# Patient Record
Sex: Female | Born: 1955 | Race: Black or African American | Hispanic: No | Smoking: Former smoker
Health system: Southern US, Community
[De-identification: ages and names within clinical notes are randomized; demographics above are authoritative.]

## PROBLEM LIST (undated history)

## (undated) DIAGNOSIS — E119 Type 2 diabetes mellitus without complications: Secondary | ICD-10-CM

## (undated) DIAGNOSIS — E785 Hyperlipidemia, unspecified: Secondary | ICD-10-CM

## (undated) DIAGNOSIS — N200 Calculus of kidney: Secondary | ICD-10-CM

## (undated) DIAGNOSIS — I10 Essential (primary) hypertension: Secondary | ICD-10-CM

## (undated) DIAGNOSIS — D759 Disease of blood and blood-forming organs, unspecified: Secondary | ICD-10-CM

## (undated) DIAGNOSIS — T7840XA Allergy, unspecified, initial encounter: Secondary | ICD-10-CM

## (undated) DIAGNOSIS — D649 Anemia, unspecified: Secondary | ICD-10-CM

## (undated) DIAGNOSIS — L439 Lichen planus, unspecified: Secondary | ICD-10-CM

## (undated) DIAGNOSIS — M1990 Unspecified osteoarthritis, unspecified site: Secondary | ICD-10-CM

## (undated) DIAGNOSIS — K589 Irritable bowel syndrome without diarrhea: Secondary | ICD-10-CM

## (undated) DIAGNOSIS — R011 Cardiac murmur, unspecified: Secondary | ICD-10-CM

## (undated) DIAGNOSIS — F419 Anxiety disorder, unspecified: Secondary | ICD-10-CM

## (undated) DIAGNOSIS — T4145XA Adverse effect of unspecified anesthetic, initial encounter: Secondary | ICD-10-CM

## (undated) DIAGNOSIS — T8859XA Other complications of anesthesia, initial encounter: Secondary | ICD-10-CM

## (undated) DIAGNOSIS — K219 Gastro-esophageal reflux disease without esophagitis: Secondary | ICD-10-CM

## (undated) DIAGNOSIS — D219 Benign neoplasm of connective and other soft tissue, unspecified: Secondary | ICD-10-CM

## (undated) HISTORY — PX: DILATION AND CURETTAGE OF UTERUS: SHX78

## (undated) HISTORY — PX: DIAGNOSTIC LAPAROSCOPY: SUR761

## (undated) HISTORY — PX: COLONOSCOPY: SHX174

## (undated) HISTORY — DX: Type 2 diabetes mellitus without complications: E11.9

## (undated) HISTORY — DX: Irritable bowel syndrome, unspecified: K58.9

## (undated) HISTORY — DX: Allergy, unspecified, initial encounter: T78.40XA

## (undated) HISTORY — DX: Anxiety disorder, unspecified: F41.9

## (undated) HISTORY — DX: Anemia, unspecified: D64.9

## (undated) HISTORY — DX: Lichen planus, unspecified: L43.9

## (undated) HISTORY — DX: Unspecified osteoarthritis, unspecified site: M19.90

## (undated) HISTORY — DX: Hyperlipidemia, unspecified: E78.5

## (undated) HISTORY — DX: Benign neoplasm of connective and other soft tissue, unspecified: D21.9

## (undated) HISTORY — PX: TUBAL LIGATION: SHX77

---

## 1998-09-23 ENCOUNTER — Other Ambulatory Visit: Admission: RE | Admit: 1998-09-23 | Discharge: 1998-09-23 | Payer: Self-pay | Admitting: Obstetrics and Gynecology

## 1999-11-10 ENCOUNTER — Emergency Department (HOSPITAL_COMMUNITY): Admission: EM | Admit: 1999-11-10 | Discharge: 1999-11-10 | Payer: Self-pay | Admitting: Emergency Medicine

## 1999-11-10 ENCOUNTER — Encounter: Payer: Self-pay | Admitting: Emergency Medicine

## 1999-11-25 ENCOUNTER — Other Ambulatory Visit: Admission: RE | Admit: 1999-11-25 | Discharge: 1999-11-25 | Payer: Self-pay | Admitting: Obstetrics and Gynecology

## 2000-02-16 ENCOUNTER — Other Ambulatory Visit: Admission: RE | Admit: 2000-02-16 | Discharge: 2000-02-16 | Payer: Self-pay | Admitting: Gastroenterology

## 2000-03-18 ENCOUNTER — Ambulatory Visit (HOSPITAL_COMMUNITY): Admission: RE | Admit: 2000-03-18 | Discharge: 2000-03-18 | Payer: Self-pay | Admitting: Gastroenterology

## 2000-03-18 ENCOUNTER — Encounter: Payer: Self-pay | Admitting: Gastroenterology

## 2000-04-23 ENCOUNTER — Ambulatory Visit (HOSPITAL_COMMUNITY): Admission: RE | Admit: 2000-04-23 | Discharge: 2000-04-23 | Payer: Self-pay | Admitting: Obstetrics and Gynecology

## 2000-04-23 ENCOUNTER — Encounter (INDEPENDENT_AMBULATORY_CARE_PROVIDER_SITE_OTHER): Payer: Self-pay

## 2001-06-01 ENCOUNTER — Other Ambulatory Visit: Admission: RE | Admit: 2001-06-01 | Discharge: 2001-06-01 | Payer: Self-pay | Admitting: *Deleted

## 2001-07-06 ENCOUNTER — Ambulatory Visit (HOSPITAL_COMMUNITY): Admission: RE | Admit: 2001-07-06 | Discharge: 2001-07-06 | Payer: Self-pay | Admitting: *Deleted

## 2001-07-06 ENCOUNTER — Encounter: Payer: Self-pay | Admitting: *Deleted

## 2001-09-16 ENCOUNTER — Ambulatory Visit (HOSPITAL_BASED_OUTPATIENT_CLINIC_OR_DEPARTMENT_OTHER): Admission: RE | Admit: 2001-09-16 | Discharge: 2001-09-16 | Payer: Self-pay | Admitting: Orthopedic Surgery

## 2001-10-12 HISTORY — PX: CARPAL TUNNEL RELEASE: SHX101

## 2002-02-06 ENCOUNTER — Emergency Department (HOSPITAL_COMMUNITY): Admission: EM | Admit: 2002-02-06 | Discharge: 2002-02-06 | Payer: Self-pay | Admitting: Emergency Medicine

## 2002-11-24 ENCOUNTER — Other Ambulatory Visit: Admission: RE | Admit: 2002-11-24 | Discharge: 2002-11-24 | Payer: Self-pay | Admitting: Obstetrics and Gynecology

## 2003-01-16 ENCOUNTER — Encounter: Payer: Self-pay | Admitting: Family Medicine

## 2003-01-16 ENCOUNTER — Ambulatory Visit (HOSPITAL_COMMUNITY): Admission: RE | Admit: 2003-01-16 | Discharge: 2003-01-16 | Payer: Self-pay | Admitting: Family Medicine

## 2004-06-25 ENCOUNTER — Other Ambulatory Visit: Admission: RE | Admit: 2004-06-25 | Discharge: 2004-06-25 | Payer: Self-pay | Admitting: Family Medicine

## 2004-08-06 ENCOUNTER — Encounter: Payer: Self-pay | Admitting: Gastroenterology

## 2004-08-26 ENCOUNTER — Ambulatory Visit: Payer: Self-pay | Admitting: Gastroenterology

## 2005-08-31 ENCOUNTER — Ambulatory Visit (HOSPITAL_COMMUNITY): Admission: RE | Admit: 2005-08-31 | Discharge: 2005-08-31 | Payer: Self-pay | Admitting: General Surgery

## 2005-12-09 ENCOUNTER — Ambulatory Visit (HOSPITAL_COMMUNITY): Admission: RE | Admit: 2005-12-09 | Discharge: 2005-12-09 | Payer: Self-pay | Admitting: Emergency Medicine

## 2006-06-02 ENCOUNTER — Ambulatory Visit: Payer: Self-pay | Admitting: Family Medicine

## 2007-04-04 DIAGNOSIS — J45909 Unspecified asthma, uncomplicated: Secondary | ICD-10-CM | POA: Insufficient documentation

## 2007-04-04 DIAGNOSIS — J309 Allergic rhinitis, unspecified: Secondary | ICD-10-CM | POA: Insufficient documentation

## 2008-07-26 ENCOUNTER — Ambulatory Visit: Payer: Self-pay | Admitting: Cardiology

## 2008-07-27 ENCOUNTER — Observation Stay (HOSPITAL_COMMUNITY): Admission: EM | Admit: 2008-07-27 | Discharge: 2008-07-28 | Payer: Self-pay | Admitting: Emergency Medicine

## 2008-07-27 ENCOUNTER — Encounter (INDEPENDENT_AMBULATORY_CARE_PROVIDER_SITE_OTHER): Payer: Self-pay | Admitting: Internal Medicine

## 2009-02-05 ENCOUNTER — Encounter: Admission: RE | Admit: 2009-02-05 | Discharge: 2009-02-05 | Payer: Self-pay | Admitting: Family Medicine

## 2009-02-13 ENCOUNTER — Ambulatory Visit (HOSPITAL_COMMUNITY): Admission: RE | Admit: 2009-02-13 | Discharge: 2009-02-13 | Payer: Self-pay | Admitting: Obstetrics and Gynecology

## 2009-10-18 ENCOUNTER — Encounter: Admission: RE | Admit: 2009-10-18 | Discharge: 2009-10-18 | Payer: Self-pay | Admitting: Neurology

## 2009-12-16 ENCOUNTER — Emergency Department (HOSPITAL_COMMUNITY): Admission: EM | Admit: 2009-12-16 | Discharge: 2009-12-16 | Payer: Self-pay | Admitting: Emergency Medicine

## 2010-06-30 ENCOUNTER — Encounter (INDEPENDENT_AMBULATORY_CARE_PROVIDER_SITE_OTHER): Payer: Self-pay | Admitting: *Deleted

## 2010-08-06 ENCOUNTER — Ambulatory Visit (HOSPITAL_COMMUNITY): Admission: RE | Admit: 2010-08-06 | Discharge: 2010-08-06 | Payer: Self-pay | Admitting: Family Medicine

## 2010-08-13 DIAGNOSIS — K219 Gastro-esophageal reflux disease without esophagitis: Secondary | ICD-10-CM | POA: Insufficient documentation

## 2010-08-13 DIAGNOSIS — D573 Sickle-cell trait: Secondary | ICD-10-CM | POA: Insufficient documentation

## 2010-08-13 DIAGNOSIS — E739 Lactose intolerance, unspecified: Secondary | ICD-10-CM | POA: Insufficient documentation

## 2010-08-13 DIAGNOSIS — K589 Irritable bowel syndrome without diarrhea: Secondary | ICD-10-CM

## 2010-08-13 HISTORY — DX: Irritable bowel syndrome, unspecified: K58.9

## 2010-08-14 ENCOUNTER — Ambulatory Visit: Payer: Self-pay | Admitting: Gastroenterology

## 2010-08-14 ENCOUNTER — Ambulatory Visit (HOSPITAL_COMMUNITY): Admission: RE | Admit: 2010-08-14 | Discharge: 2010-08-14 | Payer: Self-pay | Admitting: Gastroenterology

## 2010-08-14 DIAGNOSIS — I10 Essential (primary) hypertension: Secondary | ICD-10-CM | POA: Insufficient documentation

## 2010-08-14 DIAGNOSIS — F411 Generalized anxiety disorder: Secondary | ICD-10-CM | POA: Insufficient documentation

## 2010-08-14 DIAGNOSIS — E785 Hyperlipidemia, unspecified: Secondary | ICD-10-CM | POA: Insufficient documentation

## 2010-08-14 DIAGNOSIS — E782 Mixed hyperlipidemia: Secondary | ICD-10-CM | POA: Insufficient documentation

## 2010-08-14 DIAGNOSIS — R1011 Right upper quadrant pain: Secondary | ICD-10-CM | POA: Insufficient documentation

## 2010-08-14 LAB — CONVERTED CEMR LAB
BUN: 12 mg/dL (ref 6–23)
Basophils Absolute: 0 10*3/uL (ref 0.0–0.1)
Bilirubin, Direct: 0.1 mg/dL (ref 0.0–0.3)
Chloride: 108 meq/L (ref 96–112)
Creatinine, Ser: 0.7 mg/dL (ref 0.4–1.2)
Eosinophils Absolute: 0.2 10*3/uL (ref 0.0–0.7)
Folate: 8.5 ng/mL
GFR calc non Af Amer: 111.66 mL/min (ref >60)
Glucose, Bld: 100 mg/dL — ABNORMAL HIGH (ref 70–99)
HCT: 39.1 % (ref 36.0–46.0)
Hemoglobin: 13.2 g/dL (ref 12.0–15.0)
Iron: 83 ug/dL (ref 42–145)
Lymphocytes Relative: 28.6 % (ref 12.0–46.0)
Lymphs Abs: 2 10*3/uL (ref 0.7–4.0)
MCV: 87.3 fL (ref 78.0–100.0)
Monocytes Relative: 7.4 % (ref 3.0–12.0)
Neutro Abs: 4.3 10*3/uL (ref 1.4–7.7)
Neutrophils Relative %: 61.1 % (ref 43.0–77.0)
RBC: 4.48 M/uL (ref 3.87–5.11)
Saturation Ratios: 23.1 % (ref 20.0–50.0)
TSH: 1.92 microintl units/mL (ref 0.35–5.50)
Transferrin: 256.1 mg/dL (ref 212.0–360.0)
WBC: 7.1 10*3/uL (ref 4.5–10.5)

## 2010-08-27 ENCOUNTER — Telehealth: Payer: Self-pay | Admitting: Gastroenterology

## 2010-10-02 ENCOUNTER — Emergency Department (HOSPITAL_COMMUNITY): Admission: EM | Admit: 2010-10-02 | Payer: Self-pay | Source: Home / Self Care | Admitting: Emergency Medicine

## 2010-11-11 NOTE — Assessment & Plan Note (Signed)
Summary: abd pain--ch.   History of Present Illness Visit Type: Initial Visit Primary GI MD: Sheryn Bison MD FACP FAGA Primary Provider: Elvina Sidle, MD Chief Complaint: RUQ intermittant dull pains that radiated to upper back. Pt states her pain is worse with taking deep breaths and eating too much food at one time. Pt states her pain is much better than when is started a month ago.  History of Present Illness:   55 year old Caucasian female self referred for evaluation of chronic GERD with worsening over the last month she also complains of epigastric discomfort worse with eating with associated nausea. She denies lower gastrointestinal complaints aren't specific hepatobiliary problems. She was in the emergency room in March with a negative lab profile, EKG, chest x-ray.  She had a negative colonoscopy in October 2005 and endoscopy also was unremarkable. I cannot find previous abdominal ultrasound exam. She denies any specific hepatobiliary complaints. Her pain seems to be localized mostly in the epigastric and right upper quadrant area and does radiate into her back. She denies any specific genitourinary complaints. She does not abuse alcohol, cigarettes, or NSAIDs.     GI Review of Systems    Reports abdominal pain, acid reflux, belching, bloating, and  nausea.     Location of  Abdominal pain: RUQ.    Denies chest pain, dysphagia with liquids, dysphagia with solids, heartburn, loss of appetite, vomiting, vomiting blood, weight loss, and  weight gain.      Reports constipation, hemorrhoids, and  rectal pain.     Denies anal fissure, black tarry stools, change in bowel habit, diarrhea, diverticulosis, fecal incontinence, heme positive stool, irritable bowel syndrome, jaundice, light color stool, liver problems, and  rectal bleeding. Preventive Screening-Counseling & Management  Alcohol-Tobacco     Smoking Status: quit    Current Medications (verified): 1)  Amlodipine Besylate 10  Mg Tabs (Amlodipine Besylate) .... One Tablet By Mouth Once Daily 2)  Multivitamins   Tabs (Multiple Vitamin) .... One Tablet By Mouth Once Daily 3)  Vitamin D (Ergocalciferol) 50000 Unit Caps (Ergocalciferol) .... One Tablet By Mouth Once A Week  Allergies (verified): 1)  ! Pcn 2)  ! Sulfa 3)  ! Codeine 4)  ! Erythromycin  Past History:  Past medical, surgical, family and social histories (including risk factors) reviewed for relevance to current acute and chronic problems.  Past Medical History: Current Problems:  HYPERLIPIDEMIA (ICD-272.4) HYPERTENSION (ICD-401.9) ANXIETY (ICD-300.00) GERD (ICD-530.81) LACTOSE INTOLERANCE (ICD-271.3) IRRITABLE BOWEL SYNDROME (ICD-564.1) SICKLE CELL TRAIT (ICD-282.5) FAMILY HISTORY DIABETES 1ST DEGREE RELATIVE (ICD-V18.0) ASTHMA (ICD-493.90) ALLERGIC RHINITIS (ICD-477.9)    Past Surgical History: c-section Tubal Ligation Laparoscopy Carpal Tunnel release  Family History: Reviewed history from 04/04/2007 and no changes required. Family History Diabetes 1st degree relative Fam hx Sickle cell trait Family History of Pancreatic Cancer:Grandfather Family History of Stomach Cancer:Uncle Family History of Diabetes: Father, Brother, Sister Family History of Heart Disease: Father, Uncle  Social History: Reviewed history from 04/04/2007 and no changes required. Married [employer] Alcohol use-yes Drug use-no Patient is a former smoker.  Daily Caffeine Use Smoking Status:  quit  Review of Systems       The patient complains of anxiety-new, arthritis/joint pain, back pain, fatigue, hearing problems, heart murmur, night sweats, sleeping problems, swollen lymph glands, and urine leakage.  The patient denies allergy/sinus, anemia, blood in urine, breast changes/lumps, change in vision, confusion, cough, coughing up blood, depression-new, fainting, fever, headaches-new, heart rhythm changes, itching, menstrual pain, muscle pains/cramps,  nosebleeds, pregnancy symptoms,  shortness of breath, skin rash, sore throat, swelling of feet/legs, thirst - excessive , urination - excessive , urination changes/pain, vision changes, and voice change.    Vital Signs:  Patient profile:   55 year old female Height:      66 inches Weight:      192 pounds BMI:     31.10 Pulse rate:   80 / minute Pulse rhythm:   regular BP sitting:   124 / 82  (right arm) Cuff size:   regular  Vitals Entered By: Christie Nottingham CMA Duncan Dull) (August 14, 2010 8:46 AM)  Physical Exam  General:  Well developed, well nourished, no acute distress.healthy appearing.   Head:  Normocephalic and atraumatic. Eyes:  PERRLA, no icterus.exam deferred to patient's ophthalmologist.   Neck:  Supple; no masses or thyromegaly. Lungs:  Clear throughout to auscultation. Heart:  Regular rate and rhythm; no murmurs, rubs,  or bruits. Abdomen:  Soft, nontender and nondistended. No masses, hepatosplenomegaly or hernias noted. Normal bowel sounds. Extremities:  No clubbing, cyanosis, edema or deformities noted. Neurologic:  Alert and  oriented x4;  grossly normal neurologically. Cervical Nodes:  No significant cervical adenopathy. Psych:  Alert and cooperative. Normal mood and affect.   Impression & Recommendations:  Problem # 1:  RUQ PAIN (ICD-789.01) Assessment Unchanged rule out cholelithiasis versus worsening acid reflux with secondary esophageal spasm. We have scheduled labs and upper abdominal ultrasound exam, I will do endoscopy if ultrasound is negative. She had been placed on PPI therapy and at the spasmodic Cc p.r.n. along with p.r.n. analgesia. Standard antireflux maneuvers have been reviewed. Orders: TLB-CBC Platelet - w/Differential (85025-CBCD) TLB-BMP (Basic Metabolic Panel-BMET) (80048-METABOL) TLB-Hepatic/Liver Function Pnl (80076-HEPATIC) TLB-TSH (Thyroid Stimulating Hormone) (84443-TSH) TLB-B12, Serum-Total ONLY (09323-F57) TLB-Ferritin  (82728-FER) TLB-Folic Acid (Folate) (82746-FOL) TLB-IBC Pnl (Iron/FE;Transferrin) (83550-IBC) Ultrasound Abdomen (UAS)  Problem # 2:  ANXIETY (ICD-300.00) Assessment: Unchanged Librax t.i.d. pending workup.  Problem # 3:  LACTOSE INTOLERANCE (ICD-271.3) Assessment: Unchanged  Problem # 4:  IRRITABLE BOWEL SYNDROME (ICD-564.1) Assessment: Unchanged  Patient Instructions: 1)  Copy sent to : Elvina Sidle, MD 2)  Please go to the basement today for your labs.  3)  Your prescription(s) have been sent to you pharmacy.  4)  Your ultrasound is scheduled for today please go there after your lab. If your ultrasound is normal we will call you to schedule an upper Endoscopy 5)  Take the Nexium samples once a day, samples provided. 6)  The medication list was reviewed and reconciled.  All changed / newly prescribed medications were explained.  A complete medication list was provided to the patient / caregiver. Prescriptions: LEVSIN 0.125 MG TABS (HYOSCYAMINE SULFATE) take one by mouth as needed  #60 x 3   Entered by:   Harlow Mares CMA (AAMA)   Authorized by:   Mardella Layman MD [facility]   Signed by:   Harlow Mares CMA (AAMA) on 08/14/2010   Method used:   Electronically to        [facility]  [address] [id number] ([facility])       [address]       [place], [place]  [address]       Ph: [phone] or [phone]       Fax: [phone]   RxID:   [id number]

## 2010-11-11 NOTE — Progress Notes (Signed)
Summary: [place] Results  Phone Note Call from Patient Call back at Home Phone [phone]   Caller: Patient Call For: Dr. Jarold Motto Reason for Call: Talk to Nurse Details for Reason: Ultrasound Results Summary of Call: Pt. requesting a call regarding ultrasound results. OK to leave a message. Initial call taken by: Schuyler Amor,  August 27, 2010 2:30 PM  Follow-up for Phone Call        ultrasound normal, pt advised. i have forwarded everything to her Primary care MD Follow-up by: Harlow Mares CMA Duncan Dull),  August 27, 2010 3:37 PM

## 2010-11-11 NOTE — Procedures (Signed)
Summary: Colonoscopy   Colonoscopy  Procedure date:  08/06/2004  Findings:      Location:  [facility].   Patient Name: Rachael Garcia, Rachael Garcia MRN:  Procedure Procedures: Colonoscopy CPT: [address].  Personnel: Endoscopist: Vania Rea. Jarold Motto, MD.  Exam Location: Exam performed in Outpatient Clinic. Outpatient  Patient Consent: Procedure, Alternatives, Risks and Benefits discussed, consent obtained, from patient. Consent was obtained by the RN.  Indications  Evaluation of: Positive fecal occult blood test per home screening.  Symptoms: Constipation Patient's stools are infrequent. Patient has difficulty evacuating, strains with stool passage.  History  Current Medications: Patient is not currently taking Coumadin.  Pre-Exam Physical: Performed Aug 06, 2004. Cardio-pulmonary exam, Rectal exam, Abdominal exam, Extremity exam, Mental status exam WNL.  Exam Exam: Extent of exam reached: Cecum, extent intended: Cecum.  The cecum was identified by appendiceal orifice and IC valve. Patient position: on left side. Duration of exam: 20 minutes. Colon retroflexion performed. Images taken. ASA Classification: I. Tolerance: excellent.  Monitoring: Pulse and BP monitoring, Oximetry used. Supplemental O2 given. at 2 Liters.  Colon Prep Used Golytely for colon prep. Prep results: excellent.  Sedation Meds: Patient assessed and found to be appropriate for moderate (conscious) sedation. Fentanyl 75 mcg. given IV. Versed 7 mg. given IV.  Instrument(s): CF 140L. Serial [id number].  Findings - NORMAL EXAM: Cecum to Rectum. Not Seen: Polyps. AVM's. Colitis. Tumors. Melanosis. Crohn's. Diverticulosis. Hemorrhoids.   Assessment Normal examination.  Events  Unplanned Interventions: No intervention was required.  Plans Medication Plan: Continue current medications.  Patient Education: Patient given standard instructions for: Constipation. Disposition: After procedure  patient sent to recovery. After recovery patient sent home.  Scheduling/Referral: EGD, to [facility]. Jarold Motto, MD, on Aug 06, 2004.    CC: Angelena Sole, MD  This report was created from the original endoscopy report, which was reviewed and signed by the above listed endoscopist.

## 2010-11-11 NOTE — Letter (Signed)
Summary: New Patient letter  [facility]  [address] [place], [place] [address]   Phone: [phone]  Fax: [phone]       06/30/2010 MRN: [id number]  [facility] [address] [place], [place]  [address]  Dear [name],  Welcome to the Gastroenterology Division at [facility].    You are scheduled to see Dr.  Jarold Motto on 08-14-10 at 8:30a.m. on the 3rd floor at [facility], [address]  We ask that you try to arrive at our office 15 minutes prior to your appointment time to allow for check-in.  We would like you to complete the enclosed self-administered evaluation form prior to your visit and bring it with you on the day of your appointment.  We will review it with you.  Also, please bring a complete list of all your medications or, if you prefer, bring the medication bottles and we will list them.  Please bring your insurance card so that we may make a copy of it.  If your insurance requires a referral to see a specialist, please bring your referral form from your primary care physician.  Co-payments are due at the time of your visit and may be paid by cash, check or credit card.     Your office visit will consist of a consult with your physician (includes a physical exam), any laboratory testing he/she may order, scheduling of any necessary diagnostic testing (e.g. x-ray, ultrasound, CT-scan), and scheduling of a procedure (e.g. Endoscopy, Colonoscopy) if required.  Please allow enough time on your schedule to allow for any/all of these possibilities.    If you cannot keep your appointment, please call [phone] to cancel or reschedule prior to your appointment date.  This allows [place] the opportunity to schedule an appointment for another patient in need of care.  If you do not cancel or reschedule by 5 p.m. the business day prior to your appointment date, you will be charged a $50.00 late cancellation/no-show fee.    Thank you for choosing  [facility] for your medical needs.  We appreciate the opportunity to care for you.  Please visit [place] at our website  to learn more about our practice.                     Sincerely,                                                             The Gastroenterology Division

## 2010-11-11 NOTE — Procedures (Signed)
Summary: Endoscopy   EGD  Procedure date:  08/06/2004  Findings:      Location: [facility]   Patient Name: Rachael Garcia, Rachael Garcia MRN:  Procedure Procedures: Panendoscopy (EGD) CPT: 43235.    with esophageal dilation. CPT: G9296129.  Personnel: Endoscopist: Vania Rea. Jarold Motto, MD.  Exam Location: Exam performed in Outpatient Clinic. Outpatient  Patient Consent: Procedure, Alternatives, Risks and Benefits discussed, consent obtained, from patient. Consent was obtained by the RN.  Indications  Evaluation of: Positive fecal occult blood test  Symptoms: Dysphagia. Reflux symptoms  History  Current Medications: Patient is not currently taking Coumadin.  Pre-Exam Physical: Performed Aug 06, 2004  Cardio-pulmonary exam, Abdominal exam, Extremity exam, Mental status exam WNL.  Exam Exam Info: Maximum depth of insertion Duodenum, intended Duodenum. Patient position: on left side. Duration of exam: 15 minutes. Vocal cords visualized. Gastric retroflexion performed. Images taken. ASA Classification: I. Tolerance: excellent.  Sedation Meds: Patient assessed and found to be appropriate for moderate (conscious) sedation. Cetacaine Spray 2 sprays given aerosolized.  Monitoring: BP and pulse monitoring done. Oximetry used. Supplemental O2 given at 2 Liters.  Instrument(s): GIF 160. Serial [id number].   Findings - Normal: Fundus to Duodenal 2nd Portion. Not Seen: Ulcer. Mucosal abnormality. Foreign body.  - Normal: Proximal Esophagus to Distal Esophagus. Tumor. Barrett's esophagus. Esophageal inflammation. Stricture. Varices.  - Dilation: Proximal Esophagus. Maloney dilator used, Diameter: 56 F, No Resistance, No Heme present on extraction. 1  total dilators used. Patient tolerance excellent. Outcome: successful.   Assessment Normal examination.  Events  Unplanned Intervention: No unplanned interventions were required.  Plans Instructions: Home hemoccult tests to  be obtained, CPT: Hm. Hemoccult. 3 cards dispensed.  Medication(s): Continue current medications.  Patient Education: Patient given standard instructions for: Reflux.  Disposition: After procedure patient sent to recovery. After recovery patient sent home.  Scheduling: Follow-up prn.    CC: Angelena Sole, MD   This report was created from the original endoscopy report, which was reviewed and signed by the above listed endoscopist.

## 2011-01-05 LAB — DIFFERENTIAL
Basophils Absolute: 0 10*3/uL (ref 0.0–0.1)
Basophils Relative: 1 % (ref 0–1)
Eosinophils Relative: 1 % (ref 0–5)
Lymphocytes Relative: 29 % (ref 12–46)
Lymphs Abs: 2.5 10*3/uL (ref 0.7–4.0)
Monocytes Relative: 6 % (ref 3–12)
Neutro Abs: 5.4 10*3/uL (ref 1.7–7.7)
Neutrophils Relative %: 64 % (ref 43–77)

## 2011-01-05 LAB — POCT CARDIAC MARKERS: CKMB, poc: <1 ng/mL — ABNORMAL LOW (ref 1.0–8.0)

## 2011-01-05 LAB — BASIC METABOLIC PANEL
BUN: 9 mg/dL (ref 6–23)
CO2: 30 mEq/L (ref 19–32)
GFR calc non Af Amer: >60 mL/min (ref >60)
Glucose, Bld: 129 mg/dL — ABNORMAL HIGH (ref 70–99)
Sodium: 139 mEq/L (ref 135–145)

## 2011-01-05 LAB — CBC: Platelets: 231 10*3/uL (ref 150–400)

## 2011-02-24 NOTE — H&P (Signed)
Rachael Garcia, Rachael Garcia               ACCOUNT NO.:  [id number]   MEDICAL RECORD NO.:  [id number]          PATIENT TYPE:  OBV   LOCATION:  4732                         FACILITY:  [facility]   PHYSICIAN:  Lonia Blood, M.D.      DATE OF BIRTH:  October 04, 1956   DATE OF ADMISSION:  07/26/2008  DATE OF DISCHARGE:                              HISTORY & PHYSICAL   PRIMARY CARE PHYSICIAN:  The patient is unassigned to [place].  She goes to  [facility].   PRESENTING COMPLAINT:  Chest pain and headache.   HISTORY OF PRESENT ILLNESS:  The patient is a 55 year old female with  history of hypertension who apparently has been having some left-sided  chest pain.  She goes to [facility] for her regular care.  This  has been going on for least 2 weeks on and off, but got worse today.  She also has some left-sided headache with some numbness on the left  upper arm which has been going on for almost a month.  Symptoms seem to  have gotten worse, so she decided to come to the emergency room.  Denied  any diaphoresis.  Denied any nausea or vomiting.  Her chest pain is once  central.  There is no radiation.  She described it as 6-7/10.  No fever.  No nausea, vomiting, or diarrhea.   PAST MEDICAL HISTORY:  Significant for mainly hypertension.   MEDICATIONS:  Norvasc 5 mg daily.   ALLERGIES:  CODEINE, ERYTHROMYCIN, and SULFA.   SOCIAL HISTORY:  The patient lives in [place].  She is single.  No  heavy alcohol or IV drug use.  She smokes 2-3 cigarettes a day.   FAMILY HISTORY:  Her father died at the age of 75.  He had early  coronary artery disease, hypertension, and had defibrillator prior to  his death.   REVIEW OF SYSTEMS:  An 12-point review of systems is negative except per  HPI.   PHYSICAL EXAMINATION:  VITAL SIGNS:  On exam temperature is 97.1, blood  pressure is 158/91, pulse 68, respiratory 20, and sats 98% on room air.  GENERAL:  She is awake, alert, oriented peasant in no acute  distress.  HEENT:  PERRL.  EOMI.  NECK:  Supple.  No JVD.  No lymphadenopathy.  RESPIRATORY:  Good air entry bilaterally.  No wheezes or rales.  CARDIOVASCULAR:  The patient has S1 and S2.  No murmur.  ABDOMEN:  Soft, nontender with positive bowel sounds.  EXTREMITIES:  No edema, cyanosis or clubbing.   LABORATORY DATA:  Sodium is 143, potassium 3.6, chloride 110, BUN 11,  creatinine 1.0, glucose 100, and calcium 1.07.  White count is 7.2,  hemoglobin 13.5, and platelet count 198.  PT 13.5, INR 1.0.  Initial  cardiac enzymes are negative.  Chest x-ray showed mild cardiomegaly  otherwise unremarkable.  Head CT without contrast is currently pending.   ASSESSMENT:  A 55 year old female presenting with chest pain and risk  factors for coronary artery disease including hypertension, mild  dyslipidemia, palpitation, and positive family history.  At this point,  the history suggests chronic and not an acute chest pain.  EKG showed no  immediate change and no initial cardiac enzymes bump, chances are this  may not be cardiac.  However, due to high risk factors will admit her  for rule out MI.  We will check serial cardiac enzymes.  If it is  negative, the patient we will set up for outpatient stress testing.  The  patient also have left-sided headache as indicated and left upper  extremity weakness and numbness.  I doubt with significant at this point  however, we will follow on head CT and if needed will be do a TIA  workup.  Tobacco abuse.  The patient says she is takes tobacco on and  off it is not constant.  She does not desire any nicotine patch at this  point.      Lonia Blood, M.D.  Electronically Signed     LG/MEDQ  D:  07/27/2008  T:  07/27/2008  Job:  [id number]

## 2011-02-27 NOTE — H&P (Signed)
[facility]  Patient:    Rachael Garcia, Rachael Garcia                        MRN: [id number] Adm. Date:  04/23/00 Attending:  Janine Limbo, M.D. CC:         Delorse Lek, M.D.             Vania Rea. Jarold Motto, M.D. [facility]                         History and Physical  HISTORY OF PRESENT ILLNESS:   Rachael Garcia is a 55 year old female, para 2-0-2-2, who presents for a diagnostic laparoscopy because of severe and recurrent left lower quadrant pain.  The patient reports that her discomfort has gotten progressively worse over the past three to four months.  It has progressed to the point that she is having difficulty working when she has this pain approximately once each month.  The patient has had two cesarean sections in the past and she has also had a tubal ligation.  She had a D&C associated with a miscarriage both in 37 and 1991.  The patient is otherwise in good gynecologic health.  She denies a history of sexually transmitted infections.  She does have a known fibroid uterus.  OBSTETRICAL HISTORY:          The patient had a cesarean section in 1983 and then again in 1992.  She had a tubal ligation in 1992.  She had miscarriages in 1989 and 1991.  DRUG ALLERGIES:               PENICILLIN and SULFA medications.  PAST MEDICAL HISTORY:         The patient has a history of irritable bowel syndrome.  She also has a history of sickle cell trait.  MEDICATIONS:                  She is currently not taking any medications.  SOCIAL HISTORY:               The patient is married.  She is a cigarette smoker.  REVIEW OF SYSTEMS:            The patient complains of pelvic pressure.  FAMILY HISTORY:               The patients father has diet controlled diabetes.  Her father also had hypertension and heart disease.  Her mother is anemic.  PHYSICAL EXAMINATION  GENERAL:                      Weight is 178 pounds.  HEENT:                        Within normal  limits.  CHEST:                        Clear.  HEART:                        Regular rate and rhythm.  BREASTS:                      Without masses.  ABDOMEN:                      Nontender.  EXTREMITIES:                  Within normal limits.  NEUROLOGICAL EXAM:            Normal.  PELVIC EXAM:                  External genitalia is normal.  The vagina is normal.  Cervix is nontender.  The uterus is 8 to 10-week size and irregular. There is a 2 cm fibroid present on the right lower uterine segment.  Adnexa no masses.  Rectovaginal exam confirms.  LABORATORY VALUES:            The patient had a normal colonoscopy in May 2001.  Her other laboratory evaluations have been normal.  ASSESSMENT:                   1. Left lower quadrant pain of increasing                                  severity.                               2. History of a fibroid uterus.  PLAN:                         A long discussion was held with the patient about our options for care.  She has elected to proceed with diagnostic laparoscopy.  If there is any pathology associated with the left ovary, then she wants to proceed with laparoscopic left oophorectomy.  She wants to maintain the integrity of her right ovary if at all possible.  She understands the indications for her procedure and she accepts the risks of, but not limited to, anesthetic complications, bleeding, infections, and possible damage to the surrounding organs.  She understands that no guarantees can be given concerning the total relief of her discomfort. DD:  04/22/00 TD:  04/22/00 Job: [id number]

## 2011-02-27 NOTE — Op Note (Signed)
[facility]  Patient:    Rachael Garcia, Rachael Garcia                      MRN: [id number] Proc. Date: 04/23/00 Adm. Date:  [id number] Attending:  Leonard Schwartz                           Operative Report  PREOPERATIVE DIAGNOSIS:       Left lower quadrant pain.  POSTOPERATIVE DIAGNOSIS:      Left lower quadrant pain.  Fibroid uterus.  Left ovarian cyst.  OPERATION:                    Diagnostic laparoscopy.  Laparoscopic left ovarian cystectomy.  Pelvic washings.  SURGEON:                      Janine Limbo, M.D.  ASSISTANT:  ANESTHESIA:                   General anesthesia.  ESTIMATED BLOOD LOSS:  INDICATIONS:                  Rachael Garcia is a 55 year old female who is status ost tubal ligation who presents with left lower quadrant pain that has occurred for the past three to four months.  Her pain is severe enough that she has had difficulty working and she has been to the emergency room on several occasions.  The patient understands the indications for her procedure and she accepts the risks of, but not limited to, anesthetic complications, bleeding, infections, and possible damage to the surrounding organs.  The patient wants to keep her uterus and she also wants to keep her right ovary unless a malignancy is present.  She understands that no guarantees can be given concerning the total relief of her pelvic discomfort.  FINDINGS:                     The uterus was approximately 12 weeks size and there was a 5 cm fibroid present at the right fundus and a 4 cm fibroid present at the posterior left lower surface.  There was an 8 cm clear cyst on the left ovary. The left ovary otherwise appeared normal.  The right ovary appeared normal.  Both fallopian tubes appeared normal except for defects from the prior tubal ligation. The appendix appeared normal.  The bowel was normal.  The liver appeared normal. There was no evidence of  malignancy present in the pelvis or in the abdomen.  DESCRIPTION OF PROCEDURE:     The patient was taken to the operating room where a general anesthesia was given.  The patients abdomen, perineum, and vagina were prepped with multiple layers of Betadine.  A Foley catheter was placed in the bladder.  Examination under anesthesia was performed.  The patient was sterilely draped.  The subumbilical area was injected with 0.5% Marcaine.  A subumbilical  incision was made and the Veress needle was inserted into the abdominal cavity without difficulty.  Proper placement was confirmed using the saline drop test. A pneumoperitoneum was then obtained.  The laparoscopic trocar and then the laparoscope were substituted for the Veress needle.  The pelvic organs were visualized with findings as mentioned above.  Two suprapubic areas were injected with 0.5% marcaine.  Two suprapubic incisions were made and 5 mm trocars were placed in  the lower abdomen.  Pictures were taken of the patients pelvic and abdominal structures.  Washings were obtained from the pelvis.  The left ovary as held and an incision was made in the capsule.  The laparoscopic scissors were used to incise the ovarian tissue overlying the cyst.  A combination of sharp and blunt dissection were used to separate the cyst wall from the left ovary.  The cyst was ruptured as we tried to dissect it from the ovary.  Clear fluid was noted to be  within the cyst.  We were able to completely remove the cyst.  Hemostasis was adequate.  The pelvis was then irrigated.  Again, hemostasis was noted to be adequate.  The bowel was inspected and there was no evidence of damage to the bowel.  The pneumoperitoneum was allowed to escape.  All instruments were removed. The incisions were closed using deep and superficial sutures of 4-0 Vicryl. Sponge, needle, and instrument counts were correct x 2 occasions.  The estimated blood loss was 10 cc.  The  patient tolerated her procedure well.  She was noted to drain clear yellow urine.  The patient was awakened from her anesthetic and taken to the recovery room in stable condition.  FOLLOW-UP:                    The patient was given Darvocet-N 100 one tablet every four to six hours as needed for pain.  She will return to see Janine Limbo, M.D. in two to three weeks for follow-up instructions.  She was given a copy of  the patients postoperative instruction sheet as prepared by [facility] for patients who have undergone a diagnostic laparoscopy. DD:  04/23/00 TD:  04/24/00 Job: [id number]

## 2011-02-27 NOTE — Op Note (Signed)
[place]  Patient:    Rachael Garcia, Rachael Garcia Visit Number: [id number] MRN: [id number]          Service Type: DSU Location: [facility] Attending Physician:  Ronne Binning Dictated by:   Nicki Reaper, M.D. Proc. Date: 09/16/01 Admit Date:  09/16/2001   CC:         Nicki Reaper, M.D. x 2   Operative Report  PREOPERATIVE DIAGNOSIS:  Carpal tunnel syndrome right hand.  POSTOPERATIVE DIAGNOSIS:  Carpal tunnel syndrome right hand.  OPERATION:  Decompression right median nerve.  SURGEON:  Nicki Reaper, M.D.  ASSISTANT:  R.N.  ANESTHESIA:  Forearm-based IV regional.  ANESTHESIOLOGIST:  Janetta Hora. Gelene Mink, M.D.  INDICATION:  The patient is a 55 year old female with a history of carpal tunnel syndrome.  EMG nerve conduction is positive which has not responded to conservative treatment.  DESCRIPTION OF PROCEDURE:  The patient was brought to the operating room where a forearm-based IV regional anesthetic was carried out without difficulty. She was prepped and draped using Betadine scrub and solution with the right arm free.  A longitudinal incision was made in the palm and carried down through the subcutaneous tissue.  Bleeders were electrocauterized.  Palmar fascia was split.  Superficial palmar arch identified.  The flexor tendon to the ring and little finger identified to the ulnar side of the median nerve.  The carpal retinaculum was incised with sharp dissection.  A right angle and sural retractor were placed between the skin and forearm fascia.  The fascia was released for approximately 3 cm proximal to the wrist crease under direct vision.  Canal was explored.  Tenosynovial tissue was thickened.  No further lesions were identified.  The wound was irrigated.  The skin was closed with interrupted 5-0 sutures.  A sterile dressing and splint was applied.  The patient tolerated the procedure well and was taken to the recovery room  for observation in satisfactory condition.  She is discharged home to return to the [facility] [place] in one week on Vicodin and Septra DS. Dictated by:   Nicki Reaper, M.D. Attending Physician:  Ronne Binning DD:  09/16/01 TD:  09/16/01 Job: [id number]

## 2011-07-14 LAB — POCT CARDIAC MARKERS
CKMB, poc: <1 — ABNORMAL LOW
Myoglobin, poc: 45.1
Troponin i, poc: <0.05

## 2011-07-14 LAB — URINALYSIS, ROUTINE W REFLEX MICROSCOPIC
Bilirubin Urine: NEGATIVE
Hgb urine dipstick: NEGATIVE
Ketones, ur: NEGATIVE
Nitrite: NEGATIVE
Specific Gravity, Urine: 1.014
Urobilinogen, UA: 1

## 2011-07-14 LAB — POCT I-STAT, CHEM 8
BUN: 11
Calcium, Ion: 1.07 — ABNORMAL LOW
Chloride: 110
Creatinine, Ser: 1
Glucose, Bld: 100 — ABNORMAL HIGH
HCT: 41
Hemoglobin: 13.9
Potassium: 3.6
Sodium: 143
TCO2: 25

## 2011-07-14 LAB — BASIC METABOLIC PANEL
BUN: 10
Creatinine, Ser: 0.67
Potassium: 3.5

## 2011-07-14 LAB — CARDIAC PANEL(CRET KIN+CKTOT+MB+TROPI)
Relative Index: 1.2
Relative Index: INVALID
Troponin I: 0.01
Troponin I: <0.01

## 2011-07-14 LAB — URINE DRUGS OF ABUSE SCREEN W ALC, ROUTINE (REF LAB)
Barbiturate Quant, Ur: NEGATIVE
Creatinine,U: 77.3
Ethyl Alcohol: <10
Opiate Screen, Urine: NEGATIVE
Phencyclidine (PCP): NEGATIVE

## 2011-07-14 LAB — CBC
Hemoglobin: 13.5
MCHC: 33.2
Platelets: 198
RBC: 4.51
WBC: 7.2

## 2011-07-14 LAB — CK TOTAL AND CKMB (NOT AT ARMC)
CK, MB: 1.3
Relative Index: 1.2
Total CK: 108

## 2011-07-14 LAB — PROTIME-INR: INR: 1

## 2011-07-14 LAB — LIPID PANEL
Cholesterol: 227 — ABNORMAL HIGH
LDL Cholesterol: 154 — ABNORMAL HIGH
Total CHOL/HDL Ratio: 3.9
Triglycerides: 76
VLDL: 15

## 2011-07-14 LAB — PHOSPHORUS: Phosphorus: 3.8

## 2011-07-14 LAB — CALCIUM: Calcium: 8.8

## 2011-08-18 ENCOUNTER — Encounter (HOSPITAL_BASED_OUTPATIENT_CLINIC_OR_DEPARTMENT_OTHER): Payer: Self-pay | Admitting: *Deleted

## 2011-08-19 ENCOUNTER — Other Ambulatory Visit: Payer: Self-pay | Admitting: Orthopedic Surgery

## 2011-08-19 ENCOUNTER — Encounter (HOSPITAL_BASED_OUTPATIENT_CLINIC_OR_DEPARTMENT_OTHER)
Admission: RE | Admit: 2011-08-19 | Discharge: 2011-08-19 | Disposition: A | Discharge status: Home / Self Care | Payer: Worker's Compensation | Source: Ambulatory Visit | Attending: Orthopedic Surgery | Admitting: Orthopedic Surgery

## 2011-08-19 LAB — BASIC METABOLIC PANEL
BUN: 10 mg/dL (ref 6–23)
CO2: 29 mEq/L (ref 19–32)
Calcium: 9.6 mg/dL (ref 8.4–10.5)
Creatinine, Ser: 0.75 mg/dL (ref 0.50–1.10)
Glucose, Bld: 134 mg/dL — ABNORMAL HIGH (ref 70–99)

## 2011-08-20 ENCOUNTER — Encounter (HOSPITAL_BASED_OUTPATIENT_CLINIC_OR_DEPARTMENT_OTHER): Payer: Self-pay | Admitting: Anesthesiology

## 2011-08-20 ENCOUNTER — Ambulatory Visit (HOSPITAL_BASED_OUTPATIENT_CLINIC_OR_DEPARTMENT_OTHER)
Admission: RE | Admit: 2011-08-20 | Discharge: 2011-08-20 | Disposition: A | Discharge status: Home / Self Care | Payer: Worker's Compensation | Source: Ambulatory Visit | Attending: Orthopedic Surgery | Admitting: Orthopedic Surgery

## 2011-08-20 ENCOUNTER — Encounter (HOSPITAL_BASED_OUTPATIENT_CLINIC_OR_DEPARTMENT_OTHER)
Admission: RE | Disposition: A | Discharge status: Home / Self Care | Payer: Self-pay | Source: Ambulatory Visit | Attending: Orthopedic Surgery

## 2011-08-20 ENCOUNTER — Ambulatory Visit (HOSPITAL_BASED_OUTPATIENT_CLINIC_OR_DEPARTMENT_OTHER): Payer: Worker's Compensation | Admitting: Anesthesiology

## 2011-08-20 DIAGNOSIS — J45909 Unspecified asthma, uncomplicated: Secondary | ICD-10-CM | POA: Insufficient documentation

## 2011-08-20 DIAGNOSIS — K219 Gastro-esophageal reflux disease without esophagitis: Secondary | ICD-10-CM | POA: Insufficient documentation

## 2011-08-20 DIAGNOSIS — Z01812 Encounter for preprocedural laboratory examination: Secondary | ICD-10-CM | POA: Insufficient documentation

## 2011-08-20 DIAGNOSIS — D573 Sickle-cell trait: Secondary | ICD-10-CM | POA: Insufficient documentation

## 2011-08-20 DIAGNOSIS — G5600 Carpal tunnel syndrome, unspecified upper limb: Secondary | ICD-10-CM | POA: Insufficient documentation

## 2011-08-20 DIAGNOSIS — I10 Essential (primary) hypertension: Secondary | ICD-10-CM | POA: Insufficient documentation

## 2011-08-20 HISTORY — DX: Cardiac murmur, unspecified: R01.1

## 2011-08-20 HISTORY — DX: Essential (primary) hypertension: I10

## 2011-08-20 HISTORY — DX: Other complications of anesthesia, initial encounter: T88.59XA

## 2011-08-20 HISTORY — DX: Adverse effect of unspecified anesthetic, initial encounter: T41.45XA

## 2011-08-20 HISTORY — DX: Disease of blood and blood-forming organs, unspecified: D75.9

## 2011-08-20 HISTORY — DX: Gastro-esophageal reflux disease without esophagitis: K21.9

## 2011-08-20 HISTORY — PX: CARPAL TUNNEL RELEASE: SHX101

## 2011-08-20 LAB — POCT HEMOGLOBIN-HEMACUE: Hemoglobin: 11.1 g/dL — ABNORMAL LOW (ref 12.0–15.0)

## 2011-08-20 SURGERY — CARPAL TUNNEL RELEASE
Anesthesia: General | Site: Wrist | Laterality: Left | Wound class: Clean

## 2011-08-20 MED ORDER — HYDROCODONE-ACETAMINOPHEN 5-325 MG PO TABS
1.0000 | ORAL_TABLET | Freq: Four times a day (QID) | ORAL | Status: DC | PRN
  Administered 2011-08-20: 1 via ORAL

## 2011-08-20 MED ORDER — LIDOCAINE-PRILOCAINE 2.5-2.5 % EX CREA: 1.0000 "application " | TOPICAL_CREAM | Freq: Once | CUTANEOUS | Status: DC

## 2011-08-20 MED ORDER — FENTANYL CITRATE 0.05 MG/ML IJ SOLN
INTRAMUSCULAR | Status: DC | PRN
  Administered 2011-08-20: 100 ug via INTRAVENOUS

## 2011-08-20 MED ORDER — ATROPINE SULFATE 0.4 MG/ML IJ SOLN: 0.4000 mg | Freq: Once | INTRAMUSCULAR | Status: DC | PRN

## 2011-08-20 MED ORDER — KETOROLAC TROMETHAMINE 30 MG/ML IJ SOLN: 15.0000 mg | Freq: Once | INTRAMUSCULAR | Status: DC | PRN

## 2011-08-20 MED ORDER — MIDAZOLAM HCL 5 MG/5ML IJ SOLN
INTRAMUSCULAR | Status: DC | PRN
  Administered 2011-08-20: 2 mg via INTRAVENOUS

## 2011-08-20 MED ORDER — IBUPROFEN 200 MG PO TABS: 200.0000 mg | ORAL_TABLET | Freq: Four times a day (QID) | ORAL | Status: DC | PRN

## 2011-08-20 MED ORDER — MIDAZOLAM HCL 2 MG/2ML IJ SOLN
0.5000 mg | INTRAMUSCULAR | Status: DC | PRN
Start: 2011-08-20 — End: 2011-08-20

## 2011-08-20 MED ORDER — LIDOCAINE HCL (CARDIAC) 20 MG/ML IV SOLN
INTRAVENOUS | Status: DC | PRN
  Administered 2011-08-20: 60 mg via INTRAVENOUS

## 2011-08-20 MED ORDER — GLYCOPYRROLATE 0.2 MG/ML IJ SOLN: 0.2000 mg | Freq: Once | INTRAMUSCULAR | Status: DC | PRN

## 2011-08-20 MED ORDER — EPHEDRINE SULFATE 50 MG/ML IJ SOLN
INTRAMUSCULAR | Status: DC | PRN
  Administered 2011-08-20 (×2): 10 mg via INTRAVENOUS

## 2011-08-20 MED ORDER — OXYMETAZOLINE HCL 0.05 % NA SOLN: 2.0000 | Freq: Once | NASAL | Status: DC

## 2011-08-20 MED ORDER — MIDAZOLAM HCL 2 MG/ML PO SYRP: 0.5000 mg/kg | ORAL_SOLUTION | Freq: Once | ORAL | Status: DC

## 2011-08-20 MED ORDER — LACTATED RINGERS IV SOLN: 500.0000 mL | INTRAVENOUS | Status: DC

## 2011-08-20 MED ORDER — FENTANYL CITRATE 0.05 MG/ML IJ SOLN
50.0000 ug | INTRAMUSCULAR | Status: DC | PRN
  Administered 2011-08-20: 50 ug via INTRAVENOUS
  Administered 2011-08-20: 25 ug via INTRAVENOUS

## 2011-08-20 MED ORDER — METOCLOPRAMIDE HCL 5 MG/ML IJ SOLN: 10.0000 mg | Freq: Once | INTRAMUSCULAR | Status: DC | PRN

## 2011-08-20 MED ORDER — MIDAZOLAM HCL 2 MG/2ML IJ SOLN: 1.0000 mg | INTRAMUSCULAR | Status: DC | PRN

## 2011-08-20 MED ORDER — DEXAMETHASONE SODIUM PHOSPHATE 4 MG/ML IJ SOLN
INTRAMUSCULAR | Status: DC | PRN
  Administered 2011-08-20: 10 mg via INTRAVENOUS

## 2011-08-20 MED ORDER — LACTATED RINGERS IV SOLN
INTRAVENOUS | Status: DC
  Administered 2011-08-20 (×2): via INTRAVENOUS

## 2011-08-20 MED ORDER — FENTANYL CITRATE 0.05 MG/ML IJ SOLN
25.0000 ug | INTRAMUSCULAR | Status: DC | PRN
  Administered 2011-08-20: 25 ug via INTRAVENOUS

## 2011-08-20 MED ORDER — LIDOCAINE HCL 2 % IJ SOLN
INTRAMUSCULAR | Status: DC | PRN
  Administered 2011-08-20: 4 mL

## 2011-08-20 MED ORDER — PROPOFOL 10 MG/ML IV EMUL
INTRAVENOUS | Status: DC | PRN
  Administered 2011-08-20: 200 mg via INTRAVENOUS

## 2011-08-20 SURGICAL SUPPLY — 39 items
BANDAGE ADHESIVE 1X3 (GAUZE/BANDAGES/DRESSINGS) IMPLANT
BANDAGE ELASTIC 3 VELCRO ST LF (GAUZE/BANDAGES/DRESSINGS) ×2 IMPLANT
BLADE SURG 15 STRL LF DISP TIS (BLADE) ×1 IMPLANT
BLADE SURG 15 STRL SS (BLADE) ×2
BNDG CMPR 9X4 STRL LF SNTH (GAUZE/BANDAGES/DRESSINGS) ×1
BNDG ESMARK 4X9 LF (GAUZE/BANDAGES/DRESSINGS) ×1 IMPLANT
BRUSH SCRUB EZ PLAIN DRY (MISCELLANEOUS) ×2 IMPLANT
CLOTH BEACON ORANGE TIMEOUT ST (SAFETY) ×2 IMPLANT
CORDS BIPOLAR (ELECTRODE) ×1 IMPLANT
COVER MAYO STAND STRL (DRAPES) ×2 IMPLANT
COVER TABLE BACK 60X90 (DRAPES) ×2 IMPLANT
CUFF TOURNIQUET SINGLE 18IN (TOURNIQUET CUFF) ×1 IMPLANT
DECANTER SPIKE VIAL GLASS SM (MISCELLANEOUS) ×1 IMPLANT
DRAPE EXTREMITY T 121X128X90 (DRAPE) ×2 IMPLANT
DRAPE SURG 17X23 STRL (DRAPES) ×2 IMPLANT
GLOVE BIO SURGEON STRL SZ 6.5 (GLOVE) ×1 IMPLANT
GLOVE BIOGEL M STRL SZ7.5 (GLOVE) ×2 IMPLANT
GLOVE EXAM NITRILE MD LF STRL (GLOVE) ×1 IMPLANT
GLOVE ORTHO TXT STRL SZ7.5 (GLOVE) ×2 IMPLANT
GOWN BRE IMP PREV XXLGXLNG (GOWN DISPOSABLE) ×2 IMPLANT
GOWN PREVENTION PLUS XLARGE (GOWN DISPOSABLE) ×2 IMPLANT
GOWN PREVENTION PLUS XXLARGE (GOWN DISPOSABLE) ×2 IMPLANT
NEEDLE 27GAX1X1/2 (NEEDLE) ×1 IMPLANT
PACK BASIN DAY SURGERY FS (CUSTOM PROCEDURE TRAY) ×2 IMPLANT
PAD CAST 3X4 CTTN HI CHSV (CAST SUPPLIES) ×1 IMPLANT
PADDING CAST ABS 4INX4YD NS (CAST SUPPLIES)
PADDING CAST ABS COTTON 4X4 ST (CAST SUPPLIES) ×1 IMPLANT
PADDING CAST COTTON 3X4 STRL (CAST SUPPLIES) ×2
SPLINT PLASTER EXTRA FAST 3X15 (CAST SUPPLIES) ×5
SPLINT PLASTER GYPS XFAST 3X15 (CAST SUPPLIES) IMPLANT
SPONGE GAUZE 4X4 FOR O.R. (GAUZE/BANDAGES/DRESSINGS) ×1 IMPLANT
STOCKINETTE 4X48 STRL (DRAPES) ×2 IMPLANT
STRIP CLOSURE SKIN 1/2X4 (GAUZE/BANDAGES/DRESSINGS) ×2 IMPLANT
SUT PROLENE 3 0 PS 2 (SUTURE) ×2 IMPLANT
SYR 3ML 23GX1 SAFETY (SYRINGE) IMPLANT
SYR CONTROL 10ML LL (SYRINGE) ×1 IMPLANT
TRAY DSU PREP LF (CUSTOM PROCEDURE TRAY) ×2 IMPLANT
UNDERPAD 30X30 INCONTINENT (UNDERPADS AND DIAPERS) ×2 IMPLANT
WATER STERILE IRR 1000ML POUR (IV SOLUTION) ×2 IMPLANT

## 2011-08-20 NOTE — H&P (Signed)
PATIENT:      Rachael Garcia. [place]                    SEEN BY: Katy Fitch. Sevannah Madia, Montez Hageman MD   OFFICE VISIT:    10.10.12  DOB:    [birth year]  I received a call from [facility] requesting whether I would be willing to take over care of Rachael Garcia for her left carpal tunnel syndrome.    I responded in the affirmative that I would be happy to see her back and would be willing to care for her carpal tunnel syndrome.     During her Independent Medical Evaluation May 16th, 2012, we identified significant left carpal tunnel syndrome.  We did not find any evidence of underlying medical conditions that would predispose her to carpal tunnel syndrome.  A detailed record was generated at that time.    She has persistent left carpal tunnel syndrome symptoms.  She awakens frequently at night.  She has diminished symptoms in the day compared to the night, but has chronic numbness in her median innervated fingers of the left hand.   I am presented with another functional capacity questionnaire and have filled it out in detail today.  I have stated that she could lift 15 pounds with both hands. She is restricted in using her left hand due to her numbness.  She continues to work full time.  I have recommended that she proceed with proper care of her left hand with release of the left transverse carpal ligament.  With her severe abnormalities on nerve conduction, it will take four to five months to see full resolution of her symptoms.    She could return to work on modified duty within two weeks of surgery.  Questions were invited and answered in detail.     Katy Fitch Fahad Cisse, M.D., Jr./cmf   T:  10.11.12  CC: Gershon Cull to:   Lahoma Crocker, Adj.   [phone] Faxed to:   York Cerise, RN  [phone]    PATIENT:      [name]. Soden   DOB: [birth year]                    SEEN BY: Katy Fitch. Marques Ericson, Montez Hageman MD   PRE-OP SURGICAL NOTE:    11.7.12  Rachael Garcia is a 55 year-old right-hand dominant female who  initially presented for evaluation and treatment of her left carpal tunnel syndrome.  She has nocturnal symptoms four out of seven nights.  No known injury.  Nerve conduction study revealed onset latency of greater than 7 on the left.    Physical examination revealed a well appearing 56 year-old female.  Inspection of her head, neck, chest, abdomen and neuro are within normal limits except for the nerve changes to the left hand contributed to the carpal tunnel syndrome.  The left hand revealed a positive Phalen's and Tinel's.    Nerve conduction studies were as above in the HPI.    IMPRESSION:    Chronic left carpal tunnel syndrome.    PLAN:  At this time Ms. Osika wishes to proceed with left carpal tunnel release.  The procedure, risks and benefits, postoperative course were discussed with the patient at length and she was in agreement with this plan.     _______________________________ Katy Fitch. Naaman Plummer., M.D.(RD)cmf   T:  11.7.12  CC: Gershon Cull to:   Lahoma Crocker,  Adj. Faxed to:   York Cerise, RN    H&P updated on 08/20/2011 No significant interval change  H&P documentation: 08/20/2011  -History and Physical Reviewed  -Patient has been re-examined  -No change in the plan of care  Zacory Fiola JR,Meranda Dechaine V

## 2011-08-20 NOTE — Anesthesia Preprocedure Evaluation (Addendum)
Anesthesia Evaluation  Patient identified by MRN, date of birth, ID band Patient awake    Reviewed: Allergy & Precautions, H&P , NPO status , Patient's Chart, lab work & pertinent test results, reviewed documented beta blocker date and time   Airway Mallampati: II TM Distance: >3 FB Neck ROM: full    Dental No notable dental hx.    Pulmonary asthma ,    Pulmonary exam normal       Cardiovascular hypertension, On Medications - dysrhythmias + Valvular Problems/Murmurs     Neuro/Psych Negative Neurological ROS  Negative Psych ROS   GI/Hepatic Neg liver ROS, GERD-  Medicated and Controlled,  Endo/Other  Negative Endocrine ROS  Renal/GU negative Renal ROS  Genitourinary negative   Musculoskeletal   Abdominal   Peds  Hematology  (+) Sickle cell trait ,   Anesthesia Other Findings See surgeon's H&P   Reproductive/Obstetrics negative OB ROS                           Anesthesia Physical Anesthesia Plan  ASA: II  Anesthesia Plan: General   Post-op Pain Management:    Induction: Intravenous  Airway Management Planned: LMA  Additional Equipment:   Intra-op Plan:   Post-operative Plan: Extubation in OR  Informed Consent: I have reviewed the patients History and Physical, chart, labs and discussed the procedure including the risks, benefits and alternatives for the proposed anesthesia with the patient or authorized representative who has indicated his/her understanding and acceptance.     Plan Discussed with: CRNA and Surgeon  Anesthesia Plan Comments:        Anesthesia Quick Evaluation

## 2011-08-20 NOTE — Transfer of Care (Signed)
Immediate Anesthesia Transfer of Care Note  Patient: Rachael Garcia  Procedure(s) Performed:  CARPAL TUNNEL RELEASE  Patient Location: PACU  Anesthesia Type: General  Level of Consciousness: awake, alert , oriented and patient cooperative  Airway & Oxygen Therapy: Patient Spontanous Breathing and Patient connected to face mask oxygen  Post-op Assessment: Report given to PACU RN and Post -op Vital signs reviewed and stable  Post vital signs: Reviewed and stable  Complications: No apparent anesthesia complications

## 2011-08-20 NOTE — Op Note (Signed)
See dictated op note: [id number] 08/20/2011

## 2011-08-20 NOTE — Anesthesia Procedure Notes (Addendum)
Performed by: Gladys Damme   Procedure Name: LMA Insertion Date/Time: 08/20/2011 11:50 AM Performed by: Gladys Damme Pre-anesthesia Checklist: Patient identified, Timeout performed, Emergency Drugs available, Suction available and Patient being monitored Patient Re-evaluated:Patient Re-evaluated prior to inductionOxygen Delivery Method: Circle System Utilized Preoxygenation: Pre-oxygenation with 100% oxygen Intubation Type: IV induction Ventilation: Mask ventilation without difficulty LMA: LMA with gastric port inserted LMA Size: 4.0 Tube type: Oral Number of attempts: 1 Placement Confirmation: breath sounds checked- equal and bilateral and positive ETCO2 Tube secured with: Tape Dental Injury: Teeth and Oropharynx as per pre-operative assessment

## 2011-08-20 NOTE — Brief Op Note (Signed)
08/20/2011  12:09 PM  PATIENT:  Rachael Garcia  55 y.o. 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  PROCEDURE:  Procedure(s): CARPAL TUNNEL RELEASE Left wrist  SURGEON:  Surgeon(s): Wyn Forster., MD  PHYSICIAN ASSISTANT:   ASSISTANTS: Annye Rusk, P.A.-C  ANESTHESIA:   general  EBL:  Total I/O In: 800 [I.V.:800] Out: -   BLOOD ADMINISTERED:none  DRAINS: none   LOCAL MEDICATIONS USED:  2% Lidocaine 2.5 cc  SPECIMEN:  No Specimen  DISPOSITION OF SPECIMEN:  N/A  COUNTS:  YES  TOURNIQUET:  * Missing tourniquet times found for documented tourniquets in log:  7249 *  DICTATION: .Other Dictation: Dictation Number [id number]  PLAN OF CARE: Discharge to home after PACU  PATIENT DISPOSITION:  PACU - hemodynamically stable.   Delay start of Pharmacological VTE agent (>24hrs) due to surgical blood loss or risk of bleeding:  {YES/NO/NOT APPLICABLE:20182

## 2011-08-20 NOTE — Anesthesia Postprocedure Evaluation (Signed)
  Anesthesia Post-op Note  Patient: Rachael Garcia  Procedure(s) Performed:  CARPAL TUNNEL RELEASE  Patient Location: PACU  Anesthesia Type: General  Level of Consciousness: awake and alert   Airway and Oxygen Therapy: Patient Spontanous Breathing  Post-op Pain: mild  Post-op Assessment: Post-op Vital signs reviewed, Patient's Cardiovascular Status Stable, Respiratory Function Stable, Patent Airway, No signs of Nausea or vomiting, Adequate PO intake and Pain level controlled  Post-op Vital Signs: Reviewed and stable  Complications: No apparent anesthesia complications

## 2011-08-21 NOTE — Op Note (Signed)
NAME:  Rachael Garcia, Rachael Garcia                    ACCOUNT NO.:  MEDICAL RECORD NO.:  [id number]  LOCATION:                                 FACILITY:  PHYSICIAN:  Katy Fitch. Camile Esters, M.D.      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  OPERATION:  Release of left transverse carpal ligament.  OPERATING SURGEON:  Katy Fitch. Younis Mathey, MD  ASSISTANT:  Marveen Reeks Dasnoit, PA  ANESTHESIA:  General by LMA.  SUPERVISING ANESTHESIOLOGIST:  Janetta Hora. Gelene Mink, MD  INDICATIONS:  Rachael Garcia is a 55 year old woman well acquainted with our practice.  She is status post prior left carpal tunnel release in 2003, with a good long-term result.  She was referred back to our office through the courtesy of Dr. Elvina Sidle of the [facility] for evaluation and management of left hand numbness.  Clinical examination in May 2012 revealed left carpal tunnel syndrome. Electrodiagnostic studies completed by Dr. Wadie Lessen, attending physiatrist, on Feb 25, 2011, demonstrated significant left carpal tunnel syndrome that was moderately severe.  We advised Rachael Garcia at her convenience to proceed with release of her left transverse carpal ligament, essentially the same surgery she had on the right.  Preoperatively, she was reminded of the potential risks and benefits of surgery.  The risks of surgery include infection, failure to relieve all of her numbness, failure to relieve all of her pain, possible neurovascular injury during the procedure.  The anticipated benefits include relief of her numbness, relief of her hand pain, improvement of strength, and improvement of her prehensile hand function.  Preoperatively, questions were invited  and answered in detail.  PROCEDURE:  Rachael Garcia was brought to room 1 of the [facility] and placed in supine position upon the operating table.  Following the induction of general anesthesia by LMA technique under Dr. Thornton Dales direct supervision, Rachael Garcia's left arm and hand were prepped with Betadine soap and solution and sterilely draped.  A pneumatic tourniquet was applied to the proximal left brachium.  This was set at 250 mmHg due to mild systolic hypertension at the time of induction.  Following routine surgical time-out, the left arm was exsanguinated with an Esmarch bandage and the arterial tourniquet on the proximal brachium inflated to 250 mmHg.  Procedure commenced with a short incision at the distal margin of the transverse carpal ligament in the line of the ring finger.  Subcutaneous tissues were carefully divided taking care to create a subcutaneous pathway just ulnar to the palmaris longus region. The palmar fascia was identified and split longitudinally, the superficial palmar arch identified, and the distal margin of the transverse carpal ligament identified.  A Penfield #4 elevator was used to sound the canal.  The wound was then exposed with gentle retraction with Jomarie Longs skin hooks followed by use of an Alm self-retaining retractor.  After a path was created superficial and deep to the transverse  carpal ligament, scissors were used to release the ligament along its ulnar border extending into the distal forearm.  This widely opened the carpal canal.  A Sewall parotid retractor was placed allowing direct visualization of the ulnar bursa and the median nerve.  There were no apparent complications noted.  Bleeding points were not problematic.  The wound was then repaired with intradermal 3-0 Prolene suture.  The wound was dressed with Steri-Strips.  Lidocaine 2% was infiltrated for postoperative analgesia.  A compressive dressing was applied  with sterile gauze, sterile Webril, and a volar plaster splint maintaining the wrist in 10 degrees of dorsiflexion.  For aftercare, Rachael Garcia was awakened from general anesthesia, transferred to the PACU for observation of her vital signs.  She will be provided a prescription for Vicodin 5 mg 1 p.o. q.4-6 hours p.r.n. pain, 20 tablets without refill, for postoperative analgesia.  She will return to see [place] for follow up in our office in 1 week or sooner p.r.n. problems.     Katy Fitch Makailee Nudelman, M.D.     RVS/MEDQ  D:  08/20/2011  T:  08/20/2011  Job:  [id number]  cc:   Elvina Sidle, M.D.

## 2011-08-24 ENCOUNTER — Encounter (HOSPITAL_BASED_OUTPATIENT_CLINIC_OR_DEPARTMENT_OTHER): Payer: Self-pay | Admitting: Orthopedic Surgery

## 2011-12-09 ENCOUNTER — Other Ambulatory Visit: Payer: Self-pay | Admitting: Family Medicine

## 2011-12-26 ENCOUNTER — Telehealth: Payer: Self-pay

## 2011-12-26 MED ORDER — AMLODIPINE BESYLATE 10 MG PO TABS: 10.0000 mg | ORAL_TABLET | Freq: Every day | ORAL | Status: DC

## 2011-12-26 NOTE — Telephone Encounter (Signed)
Done, [facility] notifying pt.

## 2011-12-26 NOTE — Telephone Encounter (Signed)
Ok to refill meds until her physical in May.

## 2011-12-26 NOTE — Telephone Encounter (Signed)
This is our policy... Dr. Elbert Ewings, do you agree?

## 2011-12-26 NOTE — Telephone Encounter (Signed)
Dr. Milus Glazier or Administrator - Patient wants to know why she has to come in every six months just to have her blood pressure medicine refilled.  She has her annual physical in May and wants to know if we can just fill it until then.  Says she is Tired of coming in every 6 months and it interrupts her schedule every time.  Please call.   [facility] [address]

## 2012-02-25 ENCOUNTER — Ambulatory Visit (INDEPENDENT_AMBULATORY_CARE_PROVIDER_SITE_OTHER): Payer: 59 | Admitting: Family Medicine

## 2012-02-25 ENCOUNTER — Encounter: Payer: Self-pay | Admitting: Family Medicine

## 2012-02-25 VITALS — BP 126/83 | HR 78 | Temp 97.5°F | Resp 16 | Ht 65.0 in | Wt 188.0 lb

## 2012-02-25 DIAGNOSIS — Z9109 Other allergy status, other than to drugs and biological substances: Secondary | ICD-10-CM

## 2012-02-25 DIAGNOSIS — Z0000 Encounter for general adult medical examination without abnormal findings: Secondary | ICD-10-CM

## 2012-02-25 DIAGNOSIS — I10 Essential (primary) hypertension: Secondary | ICD-10-CM

## 2012-02-25 LAB — COMPREHENSIVE METABOLIC PANEL
ALT: 13 U/L (ref 0–35)
AST: 18 U/L (ref 0–37)
Albumin: 4.5 g/dL (ref 3.5–5.2)
Alkaline Phosphatase: 54 U/L (ref 39–117)
BUN: 9 mg/dL (ref 6–23)
CO2: 27 mEq/L (ref 19–32)
Calcium: 9.3 mg/dL (ref 8.4–10.5)
Chloride: 108 mEq/L (ref 96–112)
Creat: 0.74 mg/dL (ref 0.50–1.10)
Glucose, Bld: 111 mg/dL — ABNORMAL HIGH (ref 70–99)
Potassium: 4 mEq/L (ref 3.5–5.3)
Sodium: 142 mEq/L (ref 135–145)
Total Bilirubin: 0.5 mg/dL (ref 0.3–1.2)
Total Protein: 6.7 g/dL (ref 6.0–8.3)

## 2012-02-25 LAB — POCT URINALYSIS DIPSTICK
Bilirubin, UA: NEGATIVE
Blood, UA: NEGATIVE
Glucose, UA: NEGATIVE
Ketones, UA: NEGATIVE
Nitrite, UA: NEGATIVE
Protein, UA: NEGATIVE
Spec Grav, UA: 1.02
Urobilinogen, UA: 0.2
pH, UA: 6

## 2012-02-25 LAB — POCT UA - MICROSCOPIC ONLY
Crystals, Ur, HPF, POC: NEGATIVE
Yeast, UA: NEGATIVE

## 2012-02-25 LAB — LIPID PANEL
Cholesterol: 225 mg/dL — ABNORMAL HIGH (ref 0–200)
HDL: 59 mg/dL (ref >39)
LDL Cholesterol: 146 mg/dL — ABNORMAL HIGH (ref 0–99)
Total CHOL/HDL Ratio: 3.8 Ratio
Triglycerides: 100 mg/dL (ref <150)
VLDL: 20 mg/dL (ref 0–40)

## 2012-02-25 LAB — CBC
HCT: 40.8 % (ref 36.0–46.0)
Hemoglobin: 13.5 g/dL (ref 12.0–15.0)
MCH: 27.9 pg (ref 26.0–34.0)
MCHC: 33.1 g/dL (ref 30.0–36.0)
MCV: 84.3 fL (ref 78.0–100.0)
Platelets: 292 10*3/uL (ref 150–400)
RBC: 4.84 MIL/uL (ref 3.87–5.11)
RDW: 15.1 % (ref 11.5–15.5)
WBC: 6.3 10*3/uL (ref 4.0–10.5)

## 2012-02-25 LAB — TSH: TSH: 1.655 u[IU]/mL (ref 0.350–4.500)

## 2012-02-25 MED ORDER — AMLODIPINE BESYLATE 10 MG PO TABS: 10.0000 mg | ORAL_TABLET | Freq: Every day | ORAL | Status: DC

## 2012-02-25 MED ORDER — FEXOFENADINE HCL 180 MG PO TABS: 180.0000 mg | ORAL_TABLET | Freq: Every day | ORAL | Status: DC

## 2012-02-25 NOTE — Progress Notes (Signed)
Patient Name: Rachael Garcia Date of Birth: 08-Jul-1956 Medical Record Number: [id number] Gender: female Date of Encounter: 02/25/2012  History of Present Illness:  Rachael Garcia is a 56 y.o. very pleasant female patient who presents with the following:  Desire for cpe  Patient Active Problem List  Diagnoses  . LACTOSE INTOLERANCE  . HYPERLIPIDEMIA  . SICKLE CELL TRAIT  . ANXIETY  . HYPERTENSION  . ALLERGIC RHINITIS  . ASTHMA  . GERD  . IRRITABLE BOWEL SYNDROME  . RUQ PAIN   Past Medical History  Diagnosis Date  . Heart murmur   . Hypertension   . GERD (gastroesophageal reflux disease)   . Complication of anesthesia not sure    oxygen dropped post ? hyst  . Blood dyscrasia     sickle cell trait  . Anemia    Past Surgical History  Procedure Date  . Hernia repair   . Tubal ligation   . Dilation and curettage of uterus   . Colonoscopy   . Diagnostic laparoscopy   . Cesarean section 83,92  . Carpal tunnel release 03    rt  . Carpal tunnel release 08/20/2011    Procedure: CARPAL TUNNEL RELEASE;  Surgeon: Wyn Forster., MD;  Location: [facility];  Service: Orthopedics;  Laterality: Left;  . Ovarian cyst removal   . Carpal tunnel release    History  Substance Use Topics  . Smoking status: Former Smoker -- 8 years    Types: Cigarettes    Quit date: 07/13/2011  . Smokeless tobacco: Not on file  . Alcohol Use: Yes     OCCASIONALLY WINE   Family History  Problem Relation Age of Onset  . Anesthesia problems Sister   . Arthritis Mother   . Anemia Mother   . Asthma Mother   . Diabetes Sister   . Hypertension Father   . Alcohol abuse Father   . Heart disease Father    Allergies  Allergen Reactions  . Codeine   . Erythromycin   . Penicillins   . Sulfonamide Derivatives     Medication list has been reviewed and updated.  Review of Systems: Positive for sneezing (seasonal), hypertension, aches in joints (multiple) - unchanged in a  year  Physical Examination: Filed Vitals:   02/25/12 0819  BP: 126/83  Pulse: 78  Temp: 97.5 F (36.4 C)  TempSrc: Oral  Resp: 16  Height: 5\' 5"  (1.651 m)  Weight: 188 lb (85.276 kg)    Body mass index is 31.28 kg/(m^2).  Physical Examination: General appearance - alert, well appearing, and in no distress Mental status - alert, oriented to person, place, and time Eyes - pupils equal and reactive, extraocular eye movements intact Ears - bilateral TM's and external ear canals normal Nose - normal and patent, no erythema, discharge or polyps Mouth - mucous membranes moist, pharynx normal without lesions Neck - supple, no significant adenopathy Lymphatics - no palpable lymphadenopathy, no hepatosplenomegaly Chest - clear to auscultation, no wheezes, rales or rhonchi, symmetric air entry Heart - normal rate, regular rhythm, normal S1, S2, no murmurs, rubs, clicks or gallops Abdomen - soft, nontender, nondistended, no masses or organomegaly Breasts - breasts appear normal, no suspicious masses, no skin or nipple changes or axillary nodes  Assessment and Plan: 1. Routine general medical examination at a health care facility  POCT UA - Microscopic Only, POCT urinalysis dipstick, CBC, Lipid panel, Comprehensive metabolic panel, TSH  2. Hypertension  amLODipine (NORVASC) 10 MG  tablet  3. Environmental allergies  fexofenadine (ALLEGRA) 180 MG tablet

## 2012-02-25 NOTE — Patient Instructions (Signed)

## 2012-02-27 ENCOUNTER — Other Ambulatory Visit: Payer: Self-pay | Admitting: Family Medicine

## 2012-03-18 ENCOUNTER — Ambulatory Visit (INDEPENDENT_AMBULATORY_CARE_PROVIDER_SITE_OTHER): Payer: 59 | Admitting: Family Medicine

## 2012-03-18 VITALS — BP 138/79 | HR 70 | Temp 98.2°F | Resp 18 | Ht 67.0 in | Wt 191.0 lb

## 2012-03-18 DIAGNOSIS — F329 Major depressive disorder, single episode, unspecified: Secondary | ICD-10-CM

## 2012-03-18 DIAGNOSIS — F341 Dysthymic disorder: Secondary | ICD-10-CM

## 2012-03-18 NOTE — Progress Notes (Signed)
Is a 56 year old woman whose husband died 6 weeks ago. She is experiencing a lot of ambivalence and brief reactions since. She's not sleeping particularly well her appetite is okay. She's not suicidal. She's been some time with her daughters but they're grown up and going about their lives. She feels like she needs to get off base but she's stuck. She started to do some exercising, and she has read some.  Patient has had an episode of depression in the remote past but this is new and she would like to see somebody about it. He tried to go back to work but found herself tearing up from time to time and was not in the mood for work. 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   Objective: Alert, appropriate, articulate. Occasionally tears up but still has her sense of humor.  Results for orders placed in visit on 02/25/12  POCT UA - MICROSCOPIC ONLY      Component Value Range   WBC, Ur, HPF, POC 0-6     RBC, urine, microscopic 0-1     Bacteria, U Microscopic trace     Mucus, UA moderate     Epithelial cells, urine per micros 2-14     Crystals, Ur, HPF, POC neg     Casts, Ur, LPF, POC 0-2     Yeast, UA neg    POCT URINALYSIS DIPSTICK      Component Value Range   Color, UA yellow     Clarity, UA clear     Glucose, UA neg     Bilirubin, UA neg     Ketones, UA neg     Spec Grav, UA 1.020     Blood, UA neg     pH, UA 6.0     Protein, UA neg     Urobilinogen, UA 0.2     Nitrite, UA neg     Leukocytes, UA Trace    CBC      Component Value Range   WBC 6.3  4.0 - 10.5 (K/uL)   RBC 4.84  3.87 - 5.11 (MIL/uL)   Hemoglobin 13.5  12.0 - 15.0 (g/dL)   HCT 16.1  09.6 - 04.5 (%)   MCV 84.3  78.0 - 100.0 (fL)   MCH 27.9  26.0 - 34.0 (pg)   MCHC 33.1  30.0 - 36.0 (g/dL)   RDW 40.9  81.1 - 91.4 (%)   Platelets 292  150 - 400 (K/uL)  LIPID PANEL      Component Value Range   Cholesterol 225 (*) 0 - 200 (mg/dL)   Triglycerides 782  <956  (mg/dL)   HDL 59  >21 (mg/dL)   Total CHOL/HDL Ratio 3.8     VLDL 20  0 - 40 (mg/dL)   LDL Cholesterol 308 (*) 0 - 99 (mg/dL)  COMPREHENSIVE METABOLIC PANEL      Component Value Range   Sodium 142  135 - 145 (mEq/L)   Potassium 4.0  3.5 - 5.3 (mEq/L)   Chloride 108  96 - 112 (mEq/L)   CO2 27  19 - 32 (mEq/L)   Glucose, Bld 111 (*) 70 - 99 (mg/dL)   BUN 9  6 - 23 (mg/dL)   Creat 6.57  8.46 - 9.62 (mg/dL)   Total Bilirubin 0.5  0.3 - 1.2 (mg/dL)   Alkaline Phosphatase 54  39 - 117 (U/L)   AST 18  0 - 37 (U/L)  ALT 13  0 - 35 (U/L)   Total Protein 6.7  6.0 - 8.3 (g/dL)   Albumin 4.5  3.5 - 5.2 (g/dL)   Calcium 9.3  8.4 - 95.2 (mg/dL)  TSH      Component Value Range   TSH 1.655  0.350 - 4.500 (uIU/mL)     Assessment: Grief reaction-patient needs more time off from work and could probably benefit from a psychiatric consultation.  Plan: Referred to Dr. Archer Asa to get another month of leave. From followup one month. No medicines at this point but encouraged patient to continue the exercise and call the doctor mentioned.

## 2012-03-18 NOTE — Patient Instructions (Signed)
Grief Reaction  Grief is a normal response to the death of someone close to you. Feelings of fear, anger, and guilt can affect almost everyone who loses someone they love. Symptoms of depression are also common. These include problems with sleep, loss of appetite, and lack of energy. These grief reaction symptoms often last for weeks to months after a loss. They may also return during special times that remind you of the person you lost, such as an anniversary or birthday.  Anxiety, insomnia, irritability, and deep depression may last beyond the period of normal grief. If you experience these feelings for 6 months or longer, you may have clinical depression. Clinical depression requires further medical attention. If you think that you have clinical depression, you should contact your caregiver. If you have a history of depression and or a family history of depression, you are at greater risk of clinical depression. You are also at greater risk of developing clinical depression if the loss was traumatic or the loss was of someone with whom you had unresolved issues.    A grief reaction can become complicated by being blocked. This means being unable to cry or express extreme emotions. This may prolong the grieving period and worsen the emotional effects of the loss. Mourning is a natural event in human life. A healthy grief reaction is one that is not blocked . It requires a time of sadness and readjustment.It is very important to share your sorrow and fear with others, especially close friends and family. Professional counselors and clergy can also help you process your grief.  Document Released: 09/28/2005 Document Revised: 09/17/2011 Document Reviewed: 06/08/2006  ExitCare Patient Information 2012 [employer].

## 2012-03-23 ENCOUNTER — Telehealth: Payer: Self-pay

## 2012-03-23 ENCOUNTER — Other Ambulatory Visit: Payer: Self-pay | Admitting: Family Medicine

## 2012-03-23 DIAGNOSIS — F419 Anxiety disorder, unspecified: Secondary | ICD-10-CM

## 2012-03-23 MED ORDER — TRAZODONE HCL 50 MG PO TABS: 25.0000 mg | ORAL_TABLET | Freq: Every evening | ORAL | Status: DC | PRN

## 2012-03-23 NOTE — Telephone Encounter (Signed)
PATIENT WANTS TO KNOW IF DR L CAN CALL IN SOMETHING FOR HER ANXIETY AND TO HELP HER SLEEP.  S[facility] USES [facility] [address]

## 2012-03-23 NOTE — Telephone Encounter (Signed)
Patient is having trouble sleeping and having anxiety.  She has moments where she just doesn't want to do anything.  Would like to know if she can have something for anxiety and sleep.

## 2012-04-19 ENCOUNTER — Telehealth: Payer: Self-pay

## 2012-04-19 NOTE — Telephone Encounter (Signed)
Patient called. She is a patient of Dr Milus Glazier and has been out of work on STD.  Her STD runs out on the 9th.  She would like an extension.  She is still waiting to see a specialist and her STD is due to death.  She left a voice mail.  Phone [phone]

## 2012-04-19 NOTE — Telephone Encounter (Signed)
Ok to extend leave through July

## 2012-05-03 ENCOUNTER — Other Ambulatory Visit: Payer: Self-pay | Admitting: Family Medicine

## 2012-06-08 ENCOUNTER — Telehealth: Payer: Self-pay

## 2012-06-08 NOTE — Telephone Encounter (Signed)
Dr Faith Rogue called in regard to disability for pt. Left call back for Dr L.   # [phone].

## 2012-06-09 NOTE — Telephone Encounter (Signed)
I left a message on Dr. Court Joy answering machine

## 2012-08-17 ENCOUNTER — Other Ambulatory Visit: Payer: Self-pay | Admitting: Nurse Practitioner

## 2012-08-17 DIAGNOSIS — Z1231 Encounter for screening mammogram for malignant neoplasm of breast: Secondary | ICD-10-CM

## 2012-08-18 ENCOUNTER — Ambulatory Visit: Payer: 59

## 2012-09-06 ENCOUNTER — Ambulatory Visit (HOSPITAL_COMMUNITY)
Admission: RE | Admit: 2012-09-06 | Discharge: 2012-09-06 | Disposition: A | Discharge status: Home / Self Care | Payer: 59 | Source: Ambulatory Visit | Attending: Nurse Practitioner | Admitting: Nurse Practitioner

## 2012-09-06 DIAGNOSIS — Z1231 Encounter for screening mammogram for malignant neoplasm of breast: Secondary | ICD-10-CM

## 2012-09-21 DIAGNOSIS — Z0271 Encounter for disability determination: Secondary | ICD-10-CM

## 2012-09-22 ENCOUNTER — Ambulatory Visit (INDEPENDENT_AMBULATORY_CARE_PROVIDER_SITE_OTHER): Payer: 59 | Admitting: Family Medicine

## 2012-09-22 ENCOUNTER — Encounter: Payer: Self-pay | Admitting: Family Medicine

## 2012-09-22 VITALS — BP 140/84 | HR 77 | Temp 99.0°F | Resp 16 | Ht 65.5 in | Wt 191.2 lb

## 2012-09-22 DIAGNOSIS — R5383 Other fatigue: Secondary | ICD-10-CM

## 2012-09-22 DIAGNOSIS — R739 Hyperglycemia, unspecified: Secondary | ICD-10-CM

## 2012-09-22 DIAGNOSIS — R7309 Other abnormal glucose: Secondary | ICD-10-CM

## 2012-09-22 DIAGNOSIS — R5381 Other malaise: Secondary | ICD-10-CM

## 2012-09-22 DIAGNOSIS — D649 Anemia, unspecified: Secondary | ICD-10-CM

## 2012-09-22 LAB — COMPREHENSIVE METABOLIC PANEL
ALT: 21 U/L (ref 0–35)
AST: 23 U/L (ref 0–37)
Albumin: 4.3 g/dL (ref 3.5–5.2)
Alkaline Phosphatase: 49 U/L (ref 39–117)
BUN: 13 mg/dL (ref 6–23)
CO2: 28 mEq/L (ref 19–32)
Calcium: 9.2 mg/dL (ref 8.4–10.5)
Chloride: 105 mEq/L (ref 96–112)
Creat: 0.75 mg/dL (ref 0.50–1.10)
Glucose, Bld: 122 mg/dL — ABNORMAL HIGH (ref 70–99)
Potassium: 4.2 mEq/L (ref 3.5–5.3)
Sodium: 142 mEq/L (ref 135–145)
Total Bilirubin: 0.6 mg/dL (ref 0.3–1.2)
Total Protein: 6.7 g/dL (ref 6.0–8.3)

## 2012-09-22 LAB — CBC
HCT: 40.6 % (ref 36.0–46.0)
Hemoglobin: 13.8 g/dL (ref 12.0–15.0)
MCH: 28.8 pg (ref 26.0–34.0)
MCHC: 34 g/dL (ref 30.0–36.0)
MCV: 84.8 fL (ref 78.0–100.0)
Platelets: 255 10*3/uL (ref 150–400)
RBC: 4.79 MIL/uL (ref 3.87–5.11)
RDW: 15.1 % (ref 11.5–15.5)
WBC: 6.7 10*3/uL (ref 4.0–10.5)

## 2012-09-22 LAB — LIPID PANEL
Cholesterol: 246 mg/dL — ABNORMAL HIGH (ref 0–200)
HDL: 66 mg/dL (ref >39)
LDL Cholesterol: 164 mg/dL — ABNORMAL HIGH (ref 0–99)
Total CHOL/HDL Ratio: 3.7 Ratio
Triglycerides: 82 mg/dL (ref <150)
VLDL: 16 mg/dL (ref 0–40)

## 2012-09-22 LAB — POCT GLYCOSYLATED HEMOGLOBIN (HGB A1C): Hemoglobin A1C: 6.6

## 2012-09-22 LAB — FERRITIN: Ferritin: 75 ng/mL (ref 10–291)

## 2012-09-22 LAB — TSH: TSH: 1.537 u[IU]/mL (ref 0.350–4.500)

## 2012-09-22 NOTE — Progress Notes (Signed)
Is a 56 year old woman whose husband died 15 weeks ago. 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  At the same time, she is a bit stressed with them depending on her when she is trying to plan for retirement.  Her job consists of personal interactions(insurance coverage), and she is back at work now.  Considering a job change. Hearing loss left ear fully evaluated, no explanation, no change. Feeling tired. She's had some weight gain and polyuria.  She needs her blood sugar situation rechecked.  Objective:  NAD TM's normal Chest:  Clear Heart:  Reg, no murmur Ext:  No edema.  Assessment:   1. Hyperglycemia  POCT glycosylated hemoglobin (Hb [name]), Comprehensive metabolic panel, Lipid panel  2. Anemia  Ferritin  3. Fatigue  TSH, CBC

## 2012-09-23 ENCOUNTER — Telehealth: Payer: Self-pay

## 2012-09-23 NOTE — Telephone Encounter (Signed)
See labs. Pt was notified.

## 2012-09-23 NOTE — Telephone Encounter (Signed)
PATIENT STATES SHE SAW DR. Milus Glazier YESTERDAY (DEC. 12TH) AND HAD SOME LAB WORK DONE. SHE SAID SOMEONE TRIED TO CALL HER AROUND 1:00 TODAY AND SHE IS RETURNING THE CALL. BEST PHONE [phone] (CELL)   [facility]ICE [facility] [address]  MBC

## 2012-11-03 ENCOUNTER — Telehealth: Payer: Self-pay

## 2012-11-03 NOTE — Telephone Encounter (Signed)
PT STATES SHE HAVE AN APPT WITH AN ORTH TODAY AT 2:00 AND WAS HOPING SHE CAN COME BY AND P/U A COPY OF THE XRAY OF THE NECK DONE ABOUT A YEAR AGO PLEASE CALL [phone] IF POSSIBLE

## 2012-11-21 ENCOUNTER — Ambulatory Visit (INDEPENDENT_AMBULATORY_CARE_PROVIDER_SITE_OTHER): Payer: 59 | Admitting: Internal Medicine

## 2012-11-21 VITALS — BP 154/77 | HR 88 | Temp 98.0°F | Resp 16 | Ht 67.0 in | Wt 193.0 lb

## 2012-11-21 DIAGNOSIS — R05 Cough: Secondary | ICD-10-CM

## 2012-11-21 DIAGNOSIS — R059 Cough, unspecified: Secondary | ICD-10-CM

## 2012-11-21 DIAGNOSIS — J040 Acute laryngitis: Secondary | ICD-10-CM

## 2012-11-21 MED ORDER — HYDROCODONE-HOMATROPINE 5-1.5 MG/5ML PO SYRP: 5.0000 mL | ORAL_SOLUTION | Freq: Four times a day (QID) | ORAL | Status: DC | PRN

## 2012-11-21 MED ORDER — AZITHROMYCIN 250 MG PO TABS: ORAL_TABLET | ORAL | Status: DC

## 2012-11-21 NOTE — Progress Notes (Signed)
  Subjective:    Patient ID: Rachael Garcia, female    DOB: 1956-06-27, 57 y.o.   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 or PC nodes Voice hoarse Lungs clear with no wheezing on forced expiration      Assessment & Plan:  Problem 1 laryngitis Problem #2 history of asthma  Out of work 2 days Mucinex Hycodan Zithromax(history of allergy to erythromycin years ago but has taken this medicine without consequences)

## 2012-11-21 NOTE — Patient Instructions (Addendum)
Laryngitis At the top of your windpipe is your voice box. It is the source of your voice. Inside your voice box are 2 bands of muscles called vocal cords. When you breathe, your vocal cords are relaxed and open so that air can get into the lungs. When you decide to say something, these cords come together and vibrate. The sound from these vibrations goes into your throat and comes out through your mouth as sound. Laryngitis is an inflammation of the vocal cords that causes hoarseness, cough, loss of voice, sore throat, and dry throat. Laryngitis can be temporary (acute) or long-term (chronic). Most cases of acute laryngitis improve with time.Chronic laryngitis lasts for more than 3 weeks. CAUSES Laryngitis can often be related to excessive smoking, talking, or yelling, as well as inhalation of toxic fumes and allergies. Acute laryngitis is usually caused by a viral infection, vocal strain, measles or mumps, or bacterial infections. Chronic laryngitis is usually caused by vocal cord strain, vocal cord injury, postnasal drip, growths on the vocal cords, or acid reflux. SYMPTOMS   Cough.  Sore throat.  Dry throat. RISK FACTORS  Respiratory infections.  Exposure to irritating substances, such as cigarette smoke, excessive amounts of alcohol, stomach acids, and workplace chemicals.  Voice trauma, such as vocal cord injury from shouting or speaking too loud. DIAGNOSIS  Your cargiver will perform a physical exam. During the physical exam, your caregiver will examine your throat. The most common sign of laryngitis is hoarseness. Laryngoscopy may be necessary to confirm the diagnosis of this condition. This procedure allows your caregiver to look into the larynx. HOME CARE INSTRUCTIONS  Drink enough fluids to keep your urine clear or pale yellow.  Rest until you no longer have symptoms or as directed by your caregiver.  Breathe in moist air.  Take all medicine as directed by your  caregiver.  Do not smoke.  Talk as little as possible (this includes whispering).  Write on paper instead of talking until your voice is back to normal.  Follow up with your caregiver if your condition has not improved after 10 days. SEEK MEDICAL CARE IF:   You have trouble breathing.  You cough up blood.  You have persistent fever.  You have increasing pain.  You have difficulty swallowing. MAKE SURE YOU:  Understand these instructions.  Will watch your condition.  Will get help right away if you are not doing well or get worse. Document Released: 09/28/2005 Document Revised: 12/21/2011 Document Reviewed: 12/04/2010 Exit[employer] Patient Information 2013 [employer].  

## 2013-02-15 ENCOUNTER — Other Ambulatory Visit: Payer: Self-pay

## 2013-02-15 MED ORDER — AMLODIPINE BESYLATE 10 MG PO TABS: 10.0000 mg | ORAL_TABLET | Freq: Every day | ORAL | Status: DC

## 2013-04-19 ENCOUNTER — Ambulatory Visit (INDEPENDENT_AMBULATORY_CARE_PROVIDER_SITE_OTHER): Payer: 59 | Admitting: Family Medicine

## 2013-04-19 VITALS — BP 128/80 | HR 72 | Temp 98.5°F | Resp 16 | Ht 65.0 in | Wt 196.0 lb

## 2013-04-19 DIAGNOSIS — H9190 Unspecified hearing loss, unspecified ear: Secondary | ICD-10-CM

## 2013-04-19 DIAGNOSIS — R739 Hyperglycemia, unspecified: Secondary | ICD-10-CM

## 2013-04-19 DIAGNOSIS — R7309 Other abnormal glucose: Secondary | ICD-10-CM

## 2013-04-19 DIAGNOSIS — H9192 Unspecified hearing loss, left ear: Secondary | ICD-10-CM

## 2013-04-19 DIAGNOSIS — M461 Sacroiliitis, not elsewhere classified: Secondary | ICD-10-CM

## 2013-04-19 LAB — POCT GLYCOSYLATED HEMOGLOBIN (HGB A1C): Hemoglobin A1C: 6.6

## 2013-04-19 MED ORDER — DICLOFENAC SODIUM 75 MG PO TBEC: 75.0000 mg | DELAYED_RELEASE_TABLET | Freq: Two times a day (BID) | ORAL | Status: DC

## 2013-04-19 MED ORDER — TRAMADOL HCL 50 MG PO TABS: 50.0000 mg | ORAL_TABLET | Freq: Three times a day (TID) | ORAL | Status: DC | PRN

## 2013-04-19 NOTE — Progress Notes (Signed)
57 yo woman who fell on hardwood floor bruising her tailbone last Friday (5 days ago) with accelerating degree of pain.  Also c/o dry mouth off and on for a month.  Has tried some OTC moisturizers.  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  Objective:  NAD Tender sacroiliac area without ecchymosis or cellulitis.  No fluctuance. 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

## 2013-04-20 LAB — COMPREHENSIVE METABOLIC PANEL
ALT: 17 U/L (ref 0–35)
AST: 18 U/L (ref 0–37)
Albumin: 4.6 g/dL (ref 3.5–5.2)
Alkaline Phosphatase: 52 U/L (ref 39–117)
BUN: 13 mg/dL (ref 6–23)
CO2: 28 mEq/L (ref 19–32)
Calcium: 9.5 mg/dL (ref 8.4–10.5)
Chloride: 106 mEq/L (ref 96–112)
Creat: 0.79 mg/dL (ref 0.50–1.10)
Glucose, Bld: 112 mg/dL — ABNORMAL HIGH (ref 70–99)
Potassium: 4.1 mEq/L (ref 3.5–5.3)
Sodium: 142 mEq/L (ref 135–145)
Total Bilirubin: 0.4 mg/dL (ref 0.3–1.2)
Total Protein: 6.8 g/dL (ref 6.0–8.3)

## 2013-05-06 ENCOUNTER — Telehealth: Payer: Self-pay

## 2013-05-06 MED ORDER — AMLODIPINE BESYLATE 10 MG PO TABS: 10.0000 mg | ORAL_TABLET | Freq: Every day | ORAL | Status: DC

## 2013-05-06 NOTE — Telephone Encounter (Signed)
Patient is down to her last amlodipine pill. She [facility] [address]

## 2013-05-06 NOTE — Telephone Encounter (Signed)
Rx sent to pharmacy   

## 2013-05-08 ENCOUNTER — Other Ambulatory Visit: Payer: Self-pay | Admitting: Family Medicine

## 2013-05-11 ENCOUNTER — Telehealth: Payer: Self-pay

## 2013-05-11 NOTE — Telephone Encounter (Signed)
PT STATES HER SINUSES IS STILL BOTHERING HER AND SHE DIDN'T KNOW IF SHE HAVE TO COME BACK IN. PLEASE CALL [phone]   [facility] [address]

## 2013-05-11 NOTE — Telephone Encounter (Signed)
LMOM to RTC. 

## 2013-05-23 ENCOUNTER — Encounter: Payer: Self-pay | Admitting: Emergency Medicine

## 2013-05-26 ENCOUNTER — Ambulatory Visit (INDEPENDENT_AMBULATORY_CARE_PROVIDER_SITE_OTHER): Payer: BC Managed Care – PPO | Admitting: Internal Medicine

## 2013-05-26 VITALS — BP 126/70 | HR 78 | Temp 98.0°F | Resp 20 | Ht 66.0 in | Wt 196.0 lb

## 2013-05-26 DIAGNOSIS — J019 Acute sinusitis, unspecified: Secondary | ICD-10-CM

## 2013-05-26 MED ORDER — LEVOFLOXACIN 500 MG PO TABS: 500.0000 mg | ORAL_TABLET | Freq: Every day | ORAL | Status: DC

## 2013-05-26 NOTE — Progress Notes (Signed)
  Subjective:    Patient ID: Rachael Garcia, female    DOB: Sep 25, 1956, 57 y.o.   MRN: [id number]  HPI three-week history of progressive nasal congestion with resulting sinus pressure, postnasal drip, purulent discharge, fatigue, and occasional dry cough over the last 3 weeks No fever No night sweats History of allergic rhinitis  Reviewed problem list  Review of Systems Noncontributory    Objective:   Physical Exam  BP 126/70  Pulse 78  Temp(Src) 98 F (36.7 C)  Resp 20  Ht 5\' 6"  (1.676 m)  Wt 196 lb (88.905 kg)  BMI 31.65 kg/m2  SpO2 98%  TMs clear Nares boggy with purulent discharge Tender maxillary areas to percussion Oropharynx clear No adenopathy or thyromegaly Chest without wheezes on forced expiration      Assessment & Plan:  Sinusitis with cough  Meds ordered this encounter  Medications  . levofloxacin (LEVAQUIN) 500 MG tablet    Sig: Take 1 tablet (500 mg total) by mouth daily.    Dispense:  7 tablet    Refill:  0   Note antibiotic allergy pattern

## 2013-08-21 ENCOUNTER — Other Ambulatory Visit: Payer: Self-pay | Admitting: Family Medicine

## 2013-08-21 DIAGNOSIS — Z1231 Encounter for screening mammogram for malignant neoplasm of breast: Secondary | ICD-10-CM

## 2013-09-11 ENCOUNTER — Ambulatory Visit (HOSPITAL_COMMUNITY)
Admission: RE | Admit: 2013-09-11 | Discharge: 2013-09-11 | Disposition: A | Discharge status: Home / Self Care | Payer: BC Managed Care – PPO | Source: Ambulatory Visit | Attending: Family Medicine | Admitting: Family Medicine

## 2013-09-11 DIAGNOSIS — Z1231 Encounter for screening mammogram for malignant neoplasm of breast: Secondary | ICD-10-CM | POA: Insufficient documentation

## 2013-09-14 ENCOUNTER — Ambulatory Visit (INDEPENDENT_AMBULATORY_CARE_PROVIDER_SITE_OTHER): Payer: BC Managed Care – PPO | Admitting: Gastroenterology

## 2013-09-14 ENCOUNTER — Encounter: Payer: Self-pay | Admitting: Gastroenterology

## 2013-09-14 VITALS — BP 128/78 | HR 76 | Ht 65.25 in | Wt 196.4 lb

## 2013-09-14 DIAGNOSIS — K117 Disturbances of salivary secretion: Secondary | ICD-10-CM

## 2013-09-14 DIAGNOSIS — K219 Gastro-esophageal reflux disease without esophagitis: Secondary | ICD-10-CM

## 2013-09-14 DIAGNOSIS — R1013 Epigastric pain: Secondary | ICD-10-CM

## 2013-09-14 DIAGNOSIS — R682 Dry mouth, unspecified: Secondary | ICD-10-CM

## 2013-09-14 DIAGNOSIS — Z1211 Encounter for screening for malignant neoplasm of colon: Secondary | ICD-10-CM

## 2013-09-14 MED ORDER — NA SULFATE-K SULFATE-MG SULF 17.5-3.13-1.6 GM/177ML PO SOLN: ORAL | Status: DC

## 2013-09-14 MED ORDER — ESOMEPRAZOLE MAGNESIUM 40 MG PO CPDR: 40.0000 mg | DELAYED_RELEASE_CAPSULE | Freq: Every day | ORAL | Status: DC

## 2013-09-14 NOTE — Patient Instructions (Signed)
You watched a video today on acid reflux and information is provided below  You have been scheduled for an endoscopy and colonoscopy with propofol. Please follow written instructions given to you at your visit today. If you use inhalers (even only as needed), please bring them with you on the day of your procedure. Your physician has requested that you go to www.startemmi.com and enter the access code given to you at your visit today. This web site gives a general overview about your procedure. However, you should still follow specific instructions given to you by our office regarding your preparation for the procedure.  We have sent the following medications to your pharmacy for you to pick up at your convenience: Nexium 40 mg, please take one capsule by mouth once daily  _______________________________________________________________________________________________________________________________________________  Gastroesophageal Reflux Disease, Adult Gastroesophageal reflux disease (GERD) happens when acid from your stomach flows up into the esophagus. When acid comes in contact with the esophagus, the acid causes soreness (inflammation) in the esophagus. Over time, GERD may create small holes (ulcers) in the lining of the esophagus. CAUSES   Increased body weight. This puts pressure on the stomach, making acid rise from the stomach into the esophagus.  Smoking. This increases acid production in the stomach.  Drinking alcohol. This causes decreased pressure in the lower esophageal sphincter (valve or ring of muscle between the esophagus and stomach), allowing acid from the stomach into the esophagus.  Late evening meals and a full stomach. This increases pressure and acid production in the stomach.  A malformed lower esophageal sphincter. Sometimes, no cause is found. SYMPTOMS   Burning pain in the lower part of the mid-chest behind the breastbone and in the mid-stomach area. This may  occur twice a week or more often.  Trouble swallowing.  Sore throat.  Dry cough.  Asthma-like symptoms including chest tightness, shortness of breath, or wheezing. DIAGNOSIS  Your caregiver may be able to diagnose GERD based on your symptoms. In some cases, X-rays and other tests may be done to check for complications or to check the condition of your stomach and esophagus. TREATMENT  Your caregiver may recommend over-the-counter or prescription medicines to help decrease acid production. Ask your caregiver before starting or adding any new medicines.  HOME CARE INSTRUCTIONS   Change the factors that you can control. Ask your caregiver for guidance concerning weight loss, quitting smoking, and alcohol consumption.  Avoid foods and drinks that make your symptoms worse, such as:  Caffeine or alcoholic drinks.  Chocolate.  Peppermint or mint flavorings.  Garlic and onions.  Spicy foods.  Citrus fruits, such as oranges, lemons, or limes.  Tomato-based foods such as sauce, chili, salsa, and pizza.  Fried and fatty foods.  Avoid lying down for the 3 hours prior to your bedtime or prior to taking a nap.  Eat small, frequent meals instead of large meals.  Wear loose-fitting clothing. Do not wear anything tight around your waist that causes pressure on your stomach.  Raise the head of your bed 6 to 8 inches with wood blocks to help you sleep. Extra pillows will not help.  Only take over-the-counter or prescription medicines for pain, discomfort, or fever as directed by your caregiver.  Do not take aspirin, ibuprofen, or other nonsteroidal anti-inflammatory drugs (NSAIDs). SEEK IMMEDIATE MEDICAL CARE IF:   You have pain in your arms, neck, jaw, teeth, or back.  Your pain increases or changes in intensity or duration.  You develop nausea, vomiting, or sweating (  diaphoresis).  You develop shortness of breath, or you faint.  Your vomit is green, yellow, black, or looks like  coffee grounds or blood.  Your stool is red, bloody, or black. These symptoms could be signs of other problems, such as heart disease, gastric bleeding, or esophageal bleeding. MAKE SURE YOU:   Understand these instructions.  Will watch your condition.  Will get help right away if you are not doing well or get worse. Document Released: 07/08/2005 Document Revised: 12/21/2011 Document Reviewed: 04/17/2011 [facility] Patient Information 2014 [place], [place].

## 2013-09-14 NOTE — Progress Notes (Signed)
History of Present Illness:  This is a very nice 57 year old African American female who has had several months of discomfort in her epigastric area radiating to her chest described as a vague dull discomfort some burning, also some dry mouth, trouble swallowing, no true regurgitation and no hepatobiliary complaints.  She has a history of IBS but has fairly regular bowel movements at this time without melena or hematochezia.  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  She's had no dysphagia for bread or meat.  There is no history of Raynaud's phenomenon.  Family history is remarkable for stomach cancer in her grandfather, otherwise unremarkable.  She last had endoscopy and colonoscopy in 2005 because of a guaiac positive stool cards.  These exams were unremarkable.  She is status post hysterectomy.  Previous abdominal ultrasound exam in October 2011 was negative.  She was scheduled for endoscopy this was not completed.  She does have known lactose intolerance.  I have reviewed this patient's present history, medical and surgical past history, allergies and medications.     ROS:   All systems were reviewed and are negative unless otherwise stated in the HPI.    Physical Exam: Blood pressure 120/78, pulse 76 and regular and weight 196 with a BMI of 32.44. General well developed well nourished patient in no acute distress, appearing their stated age Eyes PERRLA, no icterus, fundoscopic exam per opthamologist Skin no lesions noted Neck supple, no adenopathy, no thyroid enlargement, no tenderness Chest clear to percussion and auscultation Heart no significant murmurs, gallops or rubs noted Abdomen no hepatosplenomegaly masses or tenderness, BS normal.  Extremities no acute joint lesions, edema, phlebitis or evidence of cellulitis. Neurologic patient oriented x 3, cranial nerves intact, no focal neurologic deficits  noted. Psychological mental status normal and normal affect.  Assessment and plan: Probable acid reflux with associated dry mouth, dry throat, and a globus sensation.  She also has vague rather constant epigastric abdominal pain unrelated to 18 or other maneuvers.  Is been no associated anorexia or weight loss.  I have started her on Nexium 40 mg a day and we'll repeat her endoscopy per her family history of stomach cancer, also to check for H. pylori infection.  She is due for followup colonoscopy screening exam which also will be scheduled.  Antireflux maneuvers were explained to the patient, and she saw our patient education video on acid reflux and its management.  Review of labs from the past year showed a normal CBC a metabolic profile and liver profile.  She is not on any medications that would cause dry mouth syndrome, and she may have Sjogren's syndrome and need ENT evaluation.

## 2013-09-15 ENCOUNTER — Encounter: Payer: Self-pay | Admitting: Gastroenterology

## 2013-09-18 ENCOUNTER — Telehealth: Payer: Self-pay | Admitting: Gastroenterology

## 2013-09-18 NOTE — Telephone Encounter (Signed)
Christy, please call the pt and convey Dr Norval Gable message. Thanks.

## 2013-09-18 NOTE — Telephone Encounter (Signed)
Pt called back and was informed of Dr. Norval Gable recommendation to followup with Dr. Marina Goodell in .  Pt stated she is doing better and will CB to sch'd

## 2013-09-18 NOTE — Telephone Encounter (Signed)
The patient apparently will not do endoscopy, did not schedule colonoscopy or do her labs.  Please call her and tell her I suggested she see Dr. Marina Goodell in 3 months time for GI followup, do not charge for procedure

## 2013-09-18 NOTE — Telephone Encounter (Signed)
Called and LM on Pt's voicemail

## 2013-09-20 ENCOUNTER — Encounter: Payer: BC Managed Care – PPO | Admitting: Gastroenterology

## 2013-10-20 ENCOUNTER — Telehealth: Payer: Self-pay

## 2013-10-20 MED ORDER — AMLODIPINE BESYLATE 10 MG PO TABS: 10.0000 mg | ORAL_TABLET | Freq: Every day | ORAL | Status: DC

## 2013-10-20 NOTE — Telephone Encounter (Addendum)
Patient needs a refill on a blood pressure medication. Patient mentioned the name but was not sure exactly how to pronounce or spell it. States we should just be able to look at her chart know what it is. Patient also would like to change pharmacies. Patient would like her meds sent to [facility] on [address].  [phone]

## 2013-10-20 NOTE — Telephone Encounter (Signed)
Sent in for her

## 2013-11-29 ENCOUNTER — Other Ambulatory Visit: Payer: Self-pay | Admitting: Physician Assistant

## 2013-11-30 ENCOUNTER — Telehealth: Payer: Self-pay

## 2013-11-30 ENCOUNTER — Other Ambulatory Visit: Payer: Self-pay | Admitting: Physician Assistant

## 2013-11-30 NOTE — Telephone Encounter (Signed)
Patient called wanting a refill of Amlodipine 10mg  ASAP. Patient is completely out of this medication. Please call patient ASAP.     Thank You!!!

## 2013-11-30 NOTE — Telephone Encounter (Signed)
Sent in 1 mos d/t pt being overdue for BP check-up and notified pt on VM.

## 2013-12-01 NOTE — Telephone Encounter (Signed)
RX for 30 days was sent today to her pharmacy. Please call and advise she needs an OV before this script runs out.

## 2013-12-01 NOTE — Telephone Encounter (Signed)
Pt notified and aware to come in before new script runs out.

## 2014-01-03 ENCOUNTER — Ambulatory Visit (INDEPENDENT_AMBULATORY_CARE_PROVIDER_SITE_OTHER): Payer: No Typology Code available for payment source | Admitting: Family Medicine

## 2014-01-03 VITALS — BP 134/80 | HR 65 | Temp 97.7°F | Resp 18 | Ht 66.0 in | Wt 198.8 lb

## 2014-01-03 DIAGNOSIS — Z8249 Family history of ischemic heart disease and other diseases of the circulatory system: Secondary | ICD-10-CM

## 2014-01-03 DIAGNOSIS — J309 Allergic rhinitis, unspecified: Secondary | ICD-10-CM

## 2014-01-03 DIAGNOSIS — K219 Gastro-esophageal reflux disease without esophagitis: Secondary | ICD-10-CM

## 2014-01-03 DIAGNOSIS — R42 Dizziness and giddiness: Secondary | ICD-10-CM

## 2014-01-03 DIAGNOSIS — I10 Essential (primary) hypertension: Secondary | ICD-10-CM

## 2014-01-03 DIAGNOSIS — R7309 Other abnormal glucose: Secondary | ICD-10-CM

## 2014-01-03 DIAGNOSIS — R9431 Abnormal electrocardiogram [ECG] [EKG]: Secondary | ICD-10-CM

## 2014-01-03 LAB — POCT CBC
Granulocyte percent: 47.8 %G (ref 37–80)
HEMATOCRIT: 39.4 % (ref 37.7–47.9)
HEMOGLOBIN: 12.3 g/dL (ref 12.2–16.2)
Lymph, poc: 3.2 (ref 0.6–3.4)
MCH, POC: 28 pg (ref 27–31.2)
MCHC: 31.2 g/dL — AB (ref 31.8–35.4)
MCV: 89.8 fL (ref 80–97)
MID (cbc): 0.5 (ref 0–0.9)
MPV: 10.8 fL (ref 0–99.8)
POC GRANULOCYTE: 3.3 (ref 2–6.9)
POC LYMPH PERCENT: 45.6 %L (ref 10–50)
POC MID %: 6.6 % (ref 0–12)
Platelet Count, POC: 256 10*3/uL (ref 142–424)
RBC: 4.39 M/uL (ref 4.04–5.48)
RDW, POC: 14.9 %
WBC: 7 10*3/uL (ref 4.6–10.2)

## 2014-01-03 LAB — POCT URINALYSIS DIPSTICK
Bilirubin, UA: NEGATIVE
Blood, UA: NEGATIVE
Glucose, UA: NEGATIVE
Ketones, UA: NEGATIVE
NITRITE UA: NEGATIVE
Protein, UA: NEGATIVE
Spec Grav, UA: 1.015
UROBILINOGEN UA: 0.2
pH, UA: 6

## 2014-01-03 LAB — POCT GLYCOSYLATED HEMOGLOBIN (HGB A1C): HEMOGLOBIN A1C: 6.5

## 2014-01-03 MED ORDER — AMLODIPINE BESYLATE 10 MG PO TABS: 10.0000 mg | ORAL_TABLET | Freq: Every day | ORAL | Status: DC

## 2014-01-03 NOTE — Patient Instructions (Signed)
1.  Start Claritin (Loratadine), Zyrtec, or Allegra one tablet daily for the next three months.  Allergic Rhinitis Allergic rhinitis is when the mucous membranes in the nose respond to allergens. Allergens are particles in the air that cause your body to have an allergic reaction. This causes you to release allergic antibodies. Through a chain of events, these eventually cause you to release histamine into the blood stream. Although meant to protect the body, it is this release of histamine that causes your discomfort, such as frequent sneezing, congestion, and an itchy, runny nose.  CAUSES  Seasonal allergic rhinitis (hay fever) is caused by pollen allergens that may come from grasses, trees, and weeds. Year-round allergic rhinitis (perennial allergic rhinitis) is caused by allergens such as house dust mites, pet dander, and mold spores.  SYMPTOMS   Nasal stuffiness (congestion).  Itchy, runny nose with sneezing and tearing of the eyes. DIAGNOSIS  Your health care provider can help you determine the allergen or allergens that trigger your symptoms. If you and your health care provider are unable to determine the allergen, skin or blood testing may be used. TREATMENT  Allergic Rhinitis does not have a cure, but it can be controlled by:  Medicines and allergy shots (immunotherapy).  Avoiding the allergen. Hay fever may often be treated with antihistamines in pill or nasal spray forms. Antihistamines block the effects of histamine. There are over-the-counter medicines that may help with nasal congestion and swelling around the eyes. Check with your health care provider before taking or giving this medicine.  If avoiding the allergen or the medicine prescribed do not work, there are many new medicines your health care provider can prescribe. Stronger medicine may be used if initial measures are ineffective. Desensitizing injections can be used if medicine and avoidance does not work. Desensitization  is when a patient is given ongoing shots until the body becomes less sensitive to the allergen. Make sure you follow up with your health care provider if problems continue. HOME CARE INSTRUCTIONS It is not possible to completely avoid allergens, but you can reduce your symptoms by taking steps to limit your exposure to them. It helps to know exactly what you are allergic to so that you can avoid your specific triggers. SEEK MEDICAL CARE IF:   You have a fever.  You develop a cough that does not stop easily (persistent).  You have shortness of breath.  You start wheezing.  Symptoms interfere with normal daily activities. Document Released: 06/23/2001 Document Revised: 07/19/2013 Document Reviewed: 06/05/2013 [facility] [place] Patient Information 2014 [place].

## 2014-01-03 NOTE — Progress Notes (Signed)
Subjective:    Patient ID: Rachael Garcia, female    DOB: [birth year], 58 y.o.   MRN: [id number]  01/03/2014  Allergies and Medication Refill   HPI Patient comes in today for a check on her blood pressure and medication refills. She would like an explanation as to why she has to have a follow up before she gets a refill on her medication. Patient states that her blood pressure has been doing wonderful for the last six months. Home blood pressure checks have been around 125/75. She has not been having any chest pain but has been suffering with a lot of reflux; history of reflux; has not been taking Nexium lately.  Reflux symptoms usually are located substernally and have started radiating into neck.  No diaphoresis or SOB or nausea with GERD symptoms. Started exercising three weeks ago; no reflux symptoms or chest pain during exercise. Father died at age 59 of heart related issues; had defibrillator and pacemaker x 5 years prior to death. Pt had Holter monitor two years ago; no previous stress test.  Does have some indigestion at times that radiates into her neck. This happens when she is laying down and sleeping at night. She takes OTC acid reflux medication when she has the indigestion.   She would also like to discuss her allergies. She has noted an increase in her nasal drainage and itchiness, sneezing and mild coughing. She has used loratidine OTC last week. States she has some hearing problems but states it is related to her DDD and nerve damage.  Rash present on her inner right ankle that has been itchy. Patient curious it could be from an allergy. She recently ate strawberries.   She has been feeling a little unsteady on her feet and unbalanced. She has been told in the past this is related to her DDD. If she moves too fast or is completely still she feels dizzy. This has been going on for a while but has increased some today. She states she feels a little weird.   She does go to the gym  regularly. She has changed careers in the last few months and has been doing great. She would like to schedule an appointment for her future appointment.   Review of Systems  Constitutional: Negative for fever, chills, diaphoresis and fatigue.  HENT: Positive for congestion, postnasal drip and rhinorrhea. Negative for ear pain, sinus pressure, sore throat, trouble swallowing and voice change.   Respiratory: Positive for cough. Negative for shortness of breath and stridor.   Gastrointestinal: Negative for nausea, vomiting and abdominal pain.  Endocrine: Negative for cold intolerance, heat intolerance, polydipsia, polyphagia and polyuria.  Skin: Positive for rash.  Neurological: Positive for light-headedness. Negative for dizziness, tremors, syncope, facial asymmetry, speech difficulty, weakness and headaches.    Past Medical History  Diagnosis Date  . Heart murmur   . Hypertension   . GERD (gastroesophageal reflux disease)   . Complication of anesthesia not sure    oxygen dropped post ? hyst  . Blood dyscrasia     sickle cell trait  . Anemia   . Allergy   . Anxiety   . Arthritis   . IBS (irritable bowel syndrome)   . Hiatal hernia   . Diabetes mellitus without complication    Allergies  Allergen Reactions  . Codeine   . Erythromycin   . Penicillins   . Sulfonamide Derivatives    Current Outpatient Prescriptions  Medication Sig Dispense Refill  . amLODipine (  NORVASC) 10 MG tablet Take 1 tablet (10 mg total) by mouth daily.  90 tablet  1   No current facility-administered medications for this visit.   History   Social History  . Marital Status: Married    Spouse Name: N/A    Number of Children: 2  . Years of Education: N/A   Occupational History  . insurance agent/broker    Social History Main Topics  . Smoking status: Former Smoker -- 8 years    Types: Cigarettes    Quit date: 07/13/2011  . Smokeless tobacco: Never Used  . Alcohol Use: Yes     Comment:  OCCASIONALLY WINE  . Drug Use: No  . Sexual Activity: Yes    Birth Control/ Protection: Post-menopausal   Other Topics Concern  . Not on file   Social History Narrative  . No narrative on file   Family History  Problem Relation Age of Onset  . Anesthesia problems Sister   . Arthritis Mother   . Anemia Mother   . Asthma Mother   . Diabetes Sister   . Hypertension Father   . Alcohol abuse Father   . Heart disease Father 45    defibrillator and pacemaker  . Stomach cancer Paternal Grandmother   . Multiple myeloma Maternal Grandmother        Objective:    BP 134/80  Pulse 65  Temp(Src) 97.7 F (36.5 C)  Resp 18  Ht '5\' 6"'  (1.676 m)  Wt 198 lb 12.8 oz (90.175 kg)  BMI 32.10 kg/m2  SpO2 98% Physical Exam  Constitutional: She is oriented to person, place, and time. She appears well-developed and well-nourished. No distress.  HENT:  Head: Normocephalic and atraumatic.  Right Ear: External ear normal.  Left Ear: External ear normal.  Nose: Nose normal.  Mouth/Throat: Oropharynx is clear and moist.  Eyes: Conjunctivae and EOM are normal. Pupils are equal, round, and reactive to light.  Neck: Normal range of motion. Neck supple. Carotid bruit is not present. No thyromegaly present.  Cardiovascular: Normal rate, regular rhythm, normal heart sounds and intact distal pulses.  Exam reveals no gallop and no friction rub.   No murmur heard. Pulmonary/Chest: Effort normal and breath sounds normal. She has no wheezes. She has no rales.  Abdominal: Soft. Bowel sounds are normal. She exhibits no distension and no mass. There is no tenderness. There is no rebound and no guarding.  Lymphadenopathy:    She has no cervical adenopathy.  Neurological: She is alert and oriented to person, place, and time. No cranial nerve deficit.  Skin: Skin is warm and dry. No rash noted. She is not diaphoretic. No erythema. No pallor.  Psychiatric: She has a normal mood and affect. Her behavior is  normal.   Results for orders placed in visit on 01/03/14  POCT CBC      Result Value Ref Range   WBC 7.0  4.6 - 10.2 K/uL   Lymph, poc 3.2  0.6 - 3.4   POC LYMPH PERCENT 45.6  10 - 50 %L   MID (cbc) 0.5  0 - 0.9   POC MID % 6.6  0 - 12 %M   POC Granulocyte 3.3  2 - 6.9   Granulocyte percent 47.8  37 - 80 %G   RBC 4.39  4.04 - 5.48 M/uL   Hemoglobin 12.3  12.2 - 16.2 g/dL   HCT, POC 39.4  37.7 - 47.9 %   MCV 89.8  80 - 97 fL  MCH, POC 28.0  27 - 31.2 pg   MCHC 31.2 (*) 31.8 - 35.4 g/dL   RDW, POC 14.9     Platelet Count, POC 256  142 - 424 K/uL   MPV 10.8  0 - 99.8 fL  POCT GLYCOSYLATED HEMOGLOBIN (HGB [name])      Result Value Ref Range   Hemoglobin [name] 6.5    POCT URINALYSIS DIPSTICK      Result Value Ref Range   Color, UA yellow     Clarity, UA clear     Glucose, UA neg     Bilirubin, UA neg     Ketones, UA neg     Spec Grav, UA 1.015     Blood, UA neg     pH, UA 6.0     Protein, UA neg     Urobilinogen, UA 0.2     Nitrite, UA neg     Leukocytes, UA moderate (2+)     EKG: NSR: ST changes.    Assessment & Plan:  Essential hypertension, benign - Plan: Comprehensive metabolic panel, POCT CBC, TSH, POCT glycosylated hemoglobin (Hb [name]), POCT urinalysis dipstick, EKG 12-Lead, amLODipine (NORVASC) 10 MG tablet  Allergic rhinitis, cause unspecified  Other abnormal glucose - Plan: POCT glycosylated hemoglobin (Hb [name]), POCT urinalysis dipstick  Dizziness and giddiness - Plan: POCT urinalysis dipstick, EKG 12-Lead  Esophageal reflux  Nonspecific abnormal electrocardiogram (ECG) (EKG) - Plan: Ambulatory referral to Cardiology  Family history of coronary artery disease - Plan: Ambulatory referral to Cardiology  1.  HTN: controlled; obtain labs; refill provided; follow-up six months. 2.  Allergic Rhinitis: uncontrolled; recommend restarting Loratadine 7m daily; pt declined nasal steroid. 3.  Glucose Intolerance/DMII: new; discussed in detail; recommend weight loss,  exercise, low-carb diet. 4.  Dizziness:  New.  Normal neurological exam; obtain labs.  Treat allergic rhinitis and reevaluate; return for worsening. 5.  GERD: uncontrolled; restart Nexium. 6.  Abnormal EKG:  New.  Refer to cardiology due to recent gerd symptoms, family history of heart disease. 7.  Family history of early heart disease: refer to cardiology for risk stratification.  Meds ordered this encounter  Medications  . amLODipine (NORVASC) 10 MG tablet    Sig: Take 1 tablet (10 mg total) by mouth daily.    Dispense:  90 tablet    Refill:  1    Order Specific Question:  Supervising Provider    Answer:  DLeandrew Koyanagi[[[id number]   Return in about 6 months (around 07/06/2014) for complete physical examiniation.   KReginia Forts M.D.  [facility] StreetGSt. Francis Ruby  [phone]

## 2014-01-04 ENCOUNTER — Telehealth: Payer: Self-pay

## 2014-01-04 LAB — COMPREHENSIVE METABOLIC PANEL
ALBUMIN: 4.3 g/dL (ref 3.5–5.2)
ALK PHOS: 54 U/L (ref 39–117)
ALT: 18 U/L (ref 0–35)
AST: 21 U/L (ref 0–37)
BILIRUBIN TOTAL: 0.4 mg/dL (ref 0.2–1.2)
BUN: 10 mg/dL (ref 6–23)
CO2: 27 meq/L (ref 19–32)
Calcium: 9.5 mg/dL (ref 8.4–10.5)
Chloride: 103 mEq/L (ref 96–112)
Creat: 0.81 mg/dL (ref 0.50–1.10)
GLUCOSE: 88 mg/dL (ref 70–99)
Potassium: 4.2 mEq/L (ref 3.5–5.3)
Sodium: 141 mEq/L (ref 135–145)
Total Protein: 7.1 g/dL (ref 6.0–8.3)

## 2014-01-04 LAB — TSH: TSH: 1.542 u[IU]/mL (ref 0.350–4.500)

## 2014-01-04 NOTE — Telephone Encounter (Signed)
[phone] patient is calling to see if her allergy medication was going to be called in [phone]

## 2014-01-05 ENCOUNTER — Encounter (HOSPITAL_COMMUNITY): Payer: Self-pay | Admitting: Emergency Medicine

## 2014-01-05 ENCOUNTER — Emergency Department (HOSPITAL_COMMUNITY)
Admission: EM | Admit: 2014-01-05 | Discharge: 2014-01-05 | Disposition: A | Discharge status: Home / Self Care | Payer: No Typology Code available for payment source | Attending: Emergency Medicine | Admitting: Emergency Medicine

## 2014-01-05 DIAGNOSIS — Y92410 Unspecified street and highway as the place of occurrence of the external cause: Secondary | ICD-10-CM | POA: Insufficient documentation

## 2014-01-05 DIAGNOSIS — S46909A Unspecified injury of unspecified muscle, fascia and tendon at shoulder and upper arm level, unspecified arm, initial encounter: Secondary | ICD-10-CM | POA: Insufficient documentation

## 2014-01-05 DIAGNOSIS — E119 Type 2 diabetes mellitus without complications: Secondary | ICD-10-CM | POA: Insufficient documentation

## 2014-01-05 DIAGNOSIS — S0990XA Unspecified injury of head, initial encounter: Secondary | ICD-10-CM | POA: Insufficient documentation

## 2014-01-05 DIAGNOSIS — Z862 Personal history of diseases of the blood and blood-forming organs and certain disorders involving the immune mechanism: Secondary | ICD-10-CM | POA: Insufficient documentation

## 2014-01-05 DIAGNOSIS — R011 Cardiac murmur, unspecified: Secondary | ICD-10-CM | POA: Insufficient documentation

## 2014-01-05 DIAGNOSIS — Z8659 Personal history of other mental and behavioral disorders: Secondary | ICD-10-CM | POA: Insufficient documentation

## 2014-01-05 DIAGNOSIS — Z880 Allergy status to penicillin: Secondary | ICD-10-CM | POA: Insufficient documentation

## 2014-01-05 DIAGNOSIS — I10 Essential (primary) hypertension: Secondary | ICD-10-CM | POA: Insufficient documentation

## 2014-01-05 DIAGNOSIS — S139XXA Sprain of joints and ligaments of unspecified parts of neck, initial encounter: Secondary | ICD-10-CM | POA: Insufficient documentation

## 2014-01-05 DIAGNOSIS — Z8719 Personal history of other diseases of the digestive system: Secondary | ICD-10-CM | POA: Insufficient documentation

## 2014-01-05 DIAGNOSIS — S4980XA Other specified injuries of shoulder and upper arm, unspecified arm, initial encounter: Secondary | ICD-10-CM | POA: Insufficient documentation

## 2014-01-05 DIAGNOSIS — Z8739 Personal history of other diseases of the musculoskeletal system and connective tissue: Secondary | ICD-10-CM | POA: Insufficient documentation

## 2014-01-05 DIAGNOSIS — Z79899 Other long term (current) drug therapy: Secondary | ICD-10-CM | POA: Insufficient documentation

## 2014-01-05 DIAGNOSIS — H9190 Unspecified hearing loss, unspecified ear: Secondary | ICD-10-CM | POA: Insufficient documentation

## 2014-01-05 DIAGNOSIS — Y9389 Activity, other specified: Secondary | ICD-10-CM | POA: Insufficient documentation

## 2014-01-05 DIAGNOSIS — Z87891 Personal history of nicotine dependence: Secondary | ICD-10-CM | POA: Insufficient documentation

## 2014-01-05 DIAGNOSIS — S161XXA Strain of muscle, fascia and tendon at neck level, initial encounter: Secondary | ICD-10-CM

## 2014-01-05 MED ORDER — IBUPROFEN 800 MG PO TABS: 800.0000 mg | ORAL_TABLET | Freq: Three times a day (TID) | ORAL | Status: DC

## 2014-01-05 MED ORDER — LORATADINE 10 MG PO TABS: 10.0000 mg | ORAL_TABLET | Freq: Every day | ORAL | Status: DC

## 2014-01-05 MED ORDER — IBUPROFEN 400 MG PO TABS
800.0000 mg | ORAL_TABLET | Freq: Once | ORAL | Status: AC
  Administered 2014-01-05: 800 mg via ORAL
  Filled 2014-01-05: qty 2

## 2014-01-05 MED ORDER — METHOCARBAMOL 500 MG PO TABS
500.0000 mg | ORAL_TABLET | Freq: Once | ORAL | Status: AC
  Administered 2014-01-05: 500 mg via ORAL
  Filled 2014-01-05: qty 1

## 2014-01-05 MED ORDER — FLUTICASONE PROPIONATE 50 MCG/ACT NA SUSP: 2.0000 | Freq: Every day | NASAL | Status: DC

## 2014-01-05 MED ORDER — METHOCARBAMOL 500 MG PO TABS: 500.0000 mg | ORAL_TABLET | Freq: Two times a day (BID) | ORAL | Status: DC

## 2014-01-05 NOTE — Discharge Instructions (Signed)
1. Medications: robaxin, ibuprofen, usual home medications 2. Treatment: rest, drink plenty of fluids,  3. Follow Up: Please followup with your primary doctor for discussion of your diagnoses and further evaluation after today's visit; if you do not have a primary care doctor use the resource guide provided to find one;     Cervical Sprain A cervical sprain is an injury in the neck in which the strong, fibrous tissues (ligaments) that connect your neck bones stretch or tear. Cervical sprains can range from mild to severe. Severe cervical sprains can cause the neck vertebrae to be unstable. This can lead to damage of the spinal cord and can result in serious nervous system problems. The amount of time it takes for a cervical sprain to get better depends on the cause and extent of the injury. Most cervical sprains heal in 1 to 3 weeks. CAUSES  Severe cervical sprains may be caused by:   Contact sport injuries (such as from football, rugby, wrestling, hockey, auto racing, gymnastics, diving, martial arts, or boxing).   Motor vehicle collisions.   Whiplash injuries. This is an injury from a sudden forward-and backward whipping movement of the head and neck.  Falls.  Mild cervical sprains may be caused by:   Being in an awkward position, such as while cradling a telephone between your ear and shoulder.   Sitting in a chair that does not offer proper support.   Working at a poorly Landscape architect station.   Looking up or down for long periods of time.  SYMPTOMS   Pain, soreness, stiffness, or a burning sensation in the front, back, or sides of the neck. This discomfort may develop immediately after the injury or slowly, 24 hours or more after the injury.   Pain or tenderness directly in the middle of the back of the neck.   Shoulder or upper back pain.   Limited ability to move the neck.   Headache.   Dizziness.   Weakness, numbness, or tingling in the hands or  arms.   Muscle spasms.   Difficulty swallowing or chewing.   Tenderness and swelling of the neck.  DIAGNOSIS  Most of the time your health care provider can diagnose a cervical sprain by taking your history and doing a physical exam. Your health care provider will ask about previous neck injuries and any known neck problems, such as arthritis in the neck. X-rays may be taken to find out if there are any other problems, such as with the bones of the neck. Other tests, such as a CT scan or MRI, may also be needed.  TREATMENT  Treatment depends on the severity of the cervical sprain. Mild sprains can be treated with rest, keeping the neck in place (immobilization), and pain medicines. Severe cervical sprains are immediately immobilized. Further treatment is done to help with pain, muscle spasms, and other symptoms and may include:  Medicines, such as pain relievers, numbing medicines, or muscle relaxants.   Physical therapy. This may involve stretching exercises, strengthening exercises, and posture training. Exercises and improved posture can help stabilize the neck, strengthen muscles, and help stop symptoms from returning.  HOME CARE INSTRUCTIONS   Put ice on the injured area.   Put ice in a plastic bag.   Place a towel between your skin and the bag.   Leave the ice on for 15 20 minutes, 3 4 times a day.   If your injury was severe, you may have been given a cervical collar to  wear. A cervical collar is a two-piece collar designed to keep your neck from moving while it heals.  Do not remove the collar unless instructed by your health care provider.  If you have long hair, keep it outside of the collar.  Ask your health care provider before making any adjustments to your collar. Minor adjustments may be required over time to improve comfort and reduce pressure on your chin or on the back of your head.  Ifyou are allowed to remove the collar for cleaning or bathing, follow  your health care provider's instructions on how to do so safely.  Keep your collar clean by wiping it with mild soap and water and drying it completely. If the collar you have been given includes removable pads, remove them every 1 2 days and hand wash them with soap and water. Allow them to air dry. They should be completely dry before you wear them in the collar.  If you are allowed to remove the collar for cleaning and bathing, wash and dry the skin of your neck. Check your skin for irritation or sores. If you see any, tell your health care provider.  Do not drive while wearing the collar.   Only take over-the-counter or prescription medicines for pain, discomfort, or fever as directed by your health care provider.   Keep all follow-up appointments as directed by your health care provider.   Keep all physical therapy appointments as directed by your health care provider.   Make any needed adjustments to your workstation to promote good posture.   Avoid positions and activities that make your symptoms worse.   Warm up and stretch before being active to help prevent problems.  SEEK MEDICAL CARE IF:   Your pain is not controlled with medicine.   You are unable to decrease your pain medicine over time as planned.   Your activity level is not improving as expected.  SEEK IMMEDIATE MEDICAL CARE IF:   You develop any bleeding.  You develop stomach upset.  You have signs of an allergic reaction to your medicine.   Your symptoms get worse.   You develop new, unexplained symptoms.   You have numbness, tingling, weakness, or paralysis in any part of your body.  MAKE SURE YOU:   Understand these instructions.  Will watch your condition.  Will get help right away if you are not doing well or get worse. Document Released: 07/26/2007 Document Revised: 07/19/2013 Document Reviewed: 04/05/2013 [facility] Patient Information 2014 [place].

## 2014-01-05 NOTE — Telephone Encounter (Signed)
Pt would like a prescription for Flonase and Loratadine called in for her. I have pended these medications.

## 2014-01-05 NOTE — ED Provider Notes (Signed)
Medical screening examination/treatment/procedure(s) were performed by non-physician practitioner and as supervising physician I was immediately available for consultation/collaboration.   EKG Interpretation None        Saddie Benders. Patrice Matthew, MD 01/05/14 [id number]

## 2014-01-05 NOTE — Telephone Encounter (Signed)
Spoke to pt, she is aware 
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
## 2014-01-05 NOTE — Telephone Encounter (Signed)
Rx for Loratadine and Flonase sent to pharmacy. Please apologize for the error (I thought she did not want to try the Flonase; I also thought she was going to purchase Loratadine OTC.)

## 2014-03-23 ENCOUNTER — Ambulatory Visit (INDEPENDENT_AMBULATORY_CARE_PROVIDER_SITE_OTHER): Payer: No Typology Code available for payment source | Admitting: Family Medicine

## 2014-03-23 VITALS — BP 136/78 | HR 79 | Temp 97.8°F | Resp 18 | Ht 65.0 in | Wt 195.0 lb

## 2014-03-23 DIAGNOSIS — H9190 Unspecified hearing loss, unspecified ear: Secondary | ICD-10-CM

## 2014-03-23 NOTE — Progress Notes (Signed)
[facility] [address] [place] [address] [phone]  Date:  03/23/2014   Name:  Rachael Garcia   DOB:  Aug 01, 1956   MRN:  [id number]  PCP:  Robyn Haber, MD    Chief Complaint: Ear Fullness   History of Present Illness:  Rachael Garcia is a 58 y.o. very pleasant female patient who presents with the following:  Here today to evaluate a problem with her right ear.  She has noted it feels stopped up and she keeps having popping.  She does not have any pain.  The left ear seems to be ok, but she does have some hearing loss there already.   Otherwise she is ok today.    Patient Active Problem List   Diagnosis Date Noted  . HYPERLIPIDEMIA 08/14/2010  . ANXIETY 08/14/2010  . HYPERTENSION 08/14/2010  . RUQ PAIN 08/14/2010  . LACTOSE INTOLERANCE 08/13/2010  . SICKLE CELL TRAIT 08/13/2010  . GERD 08/13/2010  . IRRITABLE BOWEL SYNDROME 08/13/2010  . ALLERGIC RHINITIS 04/04/2007  . ASTHMA 04/04/2007    Past Medical History  Diagnosis Date  . Heart murmur   . Hypertension   . GERD (gastroesophageal reflux disease)   . Complication of anesthesia not sure    oxygen dropped post ? hyst  . Blood dyscrasia     sickle cell trait  . Anemia   . Allergy   . Anxiety   . Arthritis   . IBS (irritable bowel syndrome)   . Hiatal hernia   . Diabetes mellitus without complication     Past Surgical History  Procedure Laterality Date  . Tubal ligation    . Dilation and curettage of uterus    . Colonoscopy    . Diagnostic laparoscopy      ovarian cyst removal  . Cesarean section  83,92    x 2  . Carpal tunnel release Right 03  . Carpal tunnel release  08/20/2011    Procedure: CARPAL TUNNEL RELEASE;  Surgeon: Cammie Sickle., MD;  Location: [place];  Service: Orthopedics;  Laterality: Left;    History  Substance Use Topics  . Smoking status: Current Some Day Smoker -- 8 years    Types: Cigarettes    Last Attempt to Quit:  07/13/2011  . Smokeless tobacco: Never Used  . Alcohol Use: Yes     Comment: OCCASIONALLY WINE    Family History  Problem Relation Age of Onset  . Anesthesia problems Sister   . Arthritis Mother   . Anemia Mother   . Asthma Mother   . Diabetes Sister   . Hypertension Father   . Alcohol abuse Father   . Heart disease Father 31    defibrillator and pacemaker  . Stomach cancer Paternal Grandmother   . Multiple myeloma Maternal Grandmother     Allergies  Allergen Reactions  . Codeine     Hives   . Erythromycin     hives  . Penicillins     hives  . Sulfonamide Derivatives     swelling    Medication list has been reviewed and updated.  Current Outpatient Prescriptions on File Prior to Visit  Medication Sig Dispense Refill  . amLODipine (NORVASC) 10 MG tablet Take 1 tablet (10 mg total) by mouth daily.  90 tablet  1  . loratadine (CLARITIN) 10 MG tablet Take 1 tablet (10 mg total) by mouth daily.  30 tablet  6   No current facility-administered  medications on file prior to visit.    Review of Systems:  As per HPI- otherwise negative.  Physical Examination: Filed Vitals:   03/23/14 1809  BP: 136/78  Pulse: 79  Temp: 97.8 F (36.6 C)  Resp: 18   Filed Vitals:   03/23/14 1809  Height: _0  (1.651 m)  Weight: 195 lb (88.451 kg)   Body mass index is 32.45 kg/(m^2). Ideal Body Weight: Weight in (lb) to have BMI = 25: 149.9  GEN: WDWN, NAD, Non-toxic, A & O x 3, obese ow looks well HEENT: Atraumatic, Normocephalic. Neck supple. No masses, No LAD.  Left TM normal.  Thin layer of wax visible over the right ear canal Ears and Nose: No external deformity. CV: RRR, No M/G/R. No JVD. No thrill. No extra heart sounds. PULM: CTA B, no wheezes, crackles, rhonchi. No retractions. No resp. distress. No accessory muscle use. ABD: S, NT, ND, +BS. No rebound. No HSM. EXTR: No c/c/e NEURO Normal gait.  PSYCH: Normally interactive. Conversant. Not depressed or anxious  appearing.  Calm demeanor.  Right ear: there is a thin layer of wax laying over the ear canal obstructing the canal.  Removed with a curette and her sx resolved  Assessment and Plan: Hearing loss  Removed wax as above. She will let me know if any further problems   Signed Lamar Blinks, MD

## 2014-03-23 NOTE — Patient Instructions (Signed)
Let me know if your symptoms return

## 2014-06-05 ENCOUNTER — Telehealth: Payer: Self-pay

## 2014-06-05 ENCOUNTER — Ambulatory Visit (INDEPENDENT_AMBULATORY_CARE_PROVIDER_SITE_OTHER): Payer: No Typology Code available for payment source | Admitting: Family Medicine

## 2014-06-05 VITALS — BP 140/86 | HR 88 | Temp 97.8°F | Resp 18 | Ht 65.0 in | Wt 192.6 lb

## 2014-06-05 DIAGNOSIS — M79605 Pain in left leg: Secondary | ICD-10-CM

## 2014-06-05 DIAGNOSIS — M79609 Pain in unspecified limb: Secondary | ICD-10-CM

## 2014-06-05 DIAGNOSIS — J0100 Acute maxillary sinusitis, unspecified: Secondary | ICD-10-CM

## 2014-06-05 DIAGNOSIS — J0101 Acute recurrent maxillary sinusitis: Secondary | ICD-10-CM

## 2014-06-05 DIAGNOSIS — J329 Chronic sinusitis, unspecified: Secondary | ICD-10-CM

## 2014-06-05 MED ORDER — AMOXICILLIN 875 MG PO TABS: 875.0000 mg | ORAL_TABLET | Freq: Two times a day (BID) | ORAL | Status: DC

## 2014-06-05 NOTE — Telephone Encounter (Signed)
Patient came the walk in clinic for a sinus infection and then decided to leave without being seen. Per patient "the wait is too long, all I need is an antibiotic". Patient stated she has a sinus infection and wants Dr. Joseph Art to [facility] in [facility] [address]. I informed patient she will probably be advise by the nurse to be seen prior to any medication being called in. Patient stated she didn't have time to wait, request a message to be sent to provider. Patients call back number is [phone]

## 2014-06-05 NOTE — Patient Instructions (Signed)

## 2014-06-05 NOTE — Progress Notes (Signed)
Patient ID: Rachael Garcia MRN: [id number], DOB: 04-29-1956, 58 y.o. Date of Encounter: 06/05/2014, 4:37 PM  Primary Physician: Robyn Haber, MD  Chief Complaint:  Chief Complaint  Patient presents with  . Dizziness  . Nasal Congestion    x3-4 days; no discoloration to phelgm  . Headache    left side of head hurts  . Leg Pain    3-4 days; more so the left leg    HPI: 58 y.o. year old female presents with 3-4 day history of nasal congestion, sinus pressure, and dysequilibrium.  Low pitched buzz in left ear.  Patient has chronic mild hearing loss.   Afebrile. No chills. Nasal congestion thick and green/yellow. Sinus pressure is the worst symptom. Cough is productive secondary to post nasal drip and not associated with time of day. Ears feel full, leading to sensation of muffled hearing. Has tried OTC cold preps without success. No GI complaints.   Dislikes nasal sprays.  Stopped her allergy medicine two weeks ago.  No recent antibiotics, recent travels, vomiting, or sick contacts   No leg trauma, sedentary periods, h/o cancer, or tobacco use.  Back working at call center.  Widowed.  Past Medical History  Diagnosis Date  . Heart murmur   . Hypertension   . GERD (gastroesophageal reflux disease)   . Complication of anesthesia not sure    oxygen dropped post ? hyst  . Blood dyscrasia     sickle cell trait  . Anemia   . Allergy   . Anxiety   . Arthritis   . IBS (irritable bowel syndrome)   . Hiatal hernia   . Diabetes mellitus without complication      Home Meds: Prior to Admission medications   Medication Sig Start Date End Date Taking? Authorizing Provider  amLODipine (NORVASC) 10 MG tablet Take 1 tablet (10 mg total) by mouth daily. 01/03/14  Yes Wardell Honour, MD  loratadine (CLARITIN) 10 MG tablet Take 1 tablet (10 mg total) by mouth daily. 01/05/14  Yes Wardell Honour, MD    Allergies:  Allergies  Allergen Reactions  . Codeine     Hives   . Erythromycin      hives  . Penicillins     hives  . Sulfonamide Derivatives     swelling    History   Social History  . Marital Status: Married    Spouse Name: N/A    Number of Children: 2  . Years of Education: N/A   Occupational History  . insurance agent/broker    Social History Main Topics  . Smoking status: Current Some Day Smoker -- 8 years    Types: Cigarettes    Last Attempt to Quit: 07/13/2011  . Smokeless tobacco: Never Used  . Alcohol Use: Yes     Comment: OCCASIONALLY WINE  . Drug Use: No  . Sexual Activity: Yes    Birth Control/ Protection: Post-menopausal   Other Topics Concern  . Not on file   Social History Narrative  . No narrative on file     Review of Systems: Constitutional: negative for chills, fever, night sweats or weight changes Cardiovascular: negative for chest pain or palpitations Respiratory: negative for hemoptysis, wheezing, or shortness of breath Abdominal: negative for abdominal pain, nausea, vomiting or diarrhea Dermatological: negative for rash Neurologic: negative for headache   Physical Exam: Blood pressure 140/86, pulse 88, temperature 97.8 F (36.6 C), temperature source Oral, resp. rate 18, height 5\' 5"  (1.651 m), weight 192  lb 9.6 oz (87.363 kg), SpO2 98.00%., Body mass index is 32.05 kg/(m^2). General: Well developed, well nourished, in no acute distress. Head: Normocephalic, atraumatic, eyes without discharge, sclera non-icteric, nares are congested. Bilateral auditory canals clear, TM's are without perforation, pearly grey with reflective cone of light bilaterally. Serous effusion bilaterally behind TM's. Maxillary sinus TTP. Oral cavity moist, dentition normal. Posterior pharynx with post nasal drip and mild erythema. No peritonsillar abscess or tonsillar exudate. Neck: Supple. No thyromegaly. Full ROM. No lymphadenopathy. Lungs: Clear bilaterally to auscultation without wheezes, rales, or rhonchi. Breathing is unlabored.  Heart: RRR  with S1 S2. No murmurs, rubs, or gallops appreciated. Msk:  Strength and tone normal for age. Extremities: No clubbing or cyanosis. No edema.  Nontender, no erythema Neuro: Alert and oriented X 3. Moves all extremities spontaneously. CNII-XII grossly in tact. Psych:  Responds to questions appropriately with a normal affect.    ASSESSMENT AND PLAN:  58 y.o. year old female with sinusitis and mild leg soreness, most likely muscular.  Recurrent maxillary sinusitis, unspecified chronicity - Plan: amoxicillin (AMOXIL) 875 MG tablet  Pain of left lower extremity   -  -Tylenol/Motrin prn -Rest/fluids -RTC precautions -RTC 3-5 days if no improvement  Signed, Robyn Haber, MD 06/05/2014 4:37 PM

## 2014-06-05 NOTE — Telephone Encounter (Signed)
LM for pt to RTC-  

## 2014-06-06 ENCOUNTER — Telehealth: Payer: Self-pay

## 2014-06-06 NOTE — Telephone Encounter (Signed)
Spoke to pt assured pt she was given the correct dose. Pt understands to complete course of abx

## 2014-06-06 NOTE — Telephone Encounter (Signed)
Pt states she was given 875 mgs of amoxil and wanted to make sure the mgs was correct, have never had any medicine that strong before Please call [phone]

## 2014-07-11 ENCOUNTER — Other Ambulatory Visit: Payer: Self-pay | Admitting: Family Medicine

## 2014-08-01 ENCOUNTER — Other Ambulatory Visit: Payer: Self-pay | Admitting: Family Medicine

## 2014-08-17 ENCOUNTER — Other Ambulatory Visit: Payer: Self-pay | Admitting: Family Medicine

## 2014-08-27 ENCOUNTER — Other Ambulatory Visit: Payer: Self-pay | Admitting: Family Medicine

## 2014-08-27 DIAGNOSIS — Z1231 Encounter for screening mammogram for malignant neoplasm of breast: Secondary | ICD-10-CM

## 2014-09-09 ENCOUNTER — Other Ambulatory Visit: Payer: Self-pay | Admitting: Physician Assistant

## 2014-09-13 ENCOUNTER — Telehealth: Payer: Self-pay

## 2014-09-13 NOTE — Telephone Encounter (Signed)
Patient wants to know why she has to come in every 3 months to have her BP medication refilled. She stated she works and it is very inconvenient for her to come in this often. She wants to know why she can't just come in for her yearly exam.Patient states she is a very healthy female and her BP is only slightly abnormal and she is thinking she may just be able to stop taking it. Patient is completely out of it now and is [facility] [address]. Patients call back number is [phone]

## 2014-09-14 ENCOUNTER — Ambulatory Visit (HOSPITAL_COMMUNITY): Payer: No Typology Code available for payment source

## 2014-09-15 MED ORDER — AMLODIPINE BESYLATE 10 MG PO TABS: 10.0000 mg | ORAL_TABLET | Freq: Every day | ORAL | Status: DC

## 2014-09-15 NOTE — Telephone Encounter (Signed)
lmom to cb.  Can ok one month because she is due for her CPE.  She can talk about how her further refills will be filled then at that time.

## 2014-09-15 NOTE — Telephone Encounter (Signed)
Pt called back.  She is very upset that we do call to let her know to set up her appt for physical and f/u for BP.  She will call on Monday to make her appt and to let them know about this as well.  Advised pt that I will send in only one month until she can get in.

## 2014-09-23 ENCOUNTER — Other Ambulatory Visit: Payer: Self-pay | Admitting: Physician Assistant

## 2014-09-28 ENCOUNTER — Ambulatory Visit (INDEPENDENT_AMBULATORY_CARE_PROVIDER_SITE_OTHER): Payer: No Typology Code available for payment source | Admitting: Internal Medicine

## 2014-09-28 ENCOUNTER — Encounter: Payer: Self-pay | Admitting: Internal Medicine

## 2014-09-28 VITALS — BP 140/84 | HR 64 | Ht 66.0 in | Wt 199.4 lb

## 2014-09-28 DIAGNOSIS — R1011 Right upper quadrant pain: Secondary | ICD-10-CM

## 2014-09-28 MED ORDER — MOVIPREP 100 G PO SOLR: 1.0000 | Freq: Once | ORAL | Status: DC

## 2014-09-28 NOTE — Progress Notes (Signed)
Rachael Garcia [birth year] [id number]  Note: This dictation was prepared with Dragon digital system. Any transcriptional errors that result from this procedure are unintentional.   History of Present Illness:  This is a 58 year old African-American female former patient of Dr. Sharlett Iles. For the past 2 weeks, she has had right middle quadrant abdominal pain radiating to her back. It was occurring on daily basis while she was constipated. It has disappeared now in the last 4 days. She denies any rectal bleeding, nausea or vomiting. An ultrasound of the gallbladder in October 2011 was normal. She had a prior colonoscopy and upper endoscopy in October 2005 for heme positive stool. Both tests were normal. A CT scan of the abdomen in 2007 for right lower quadrant abdominal pain showed tiny right renal colic and renal cyst as well as multiple uterine fibroids. There is no family history of colon cancer but there is a positive family history of gastric cancer in a grandparent.    Past Medical History  Diagnosis Date  . Heart murmur   . Hypertension   . GERD (gastroesophageal reflux disease)   . Complication of anesthesia not sure    oxygen dropped post ? hyst  . Blood dyscrasia     sickle cell trait  . Anemia   . Allergy   . Anxiety   . Arthritis   . IBS (irritable bowel syndrome)   . Hiatal hernia   . Diabetes mellitus without complication     Past Surgical History  Procedure Laterality Date  . Tubal ligation    . Dilation and curettage of uterus    . Colonoscopy    . Diagnostic laparoscopy      ovarian cyst removal  . Cesarean section  83,92    x 2  . Carpal tunnel release Right 03  . Carpal tunnel release  08/20/2011    Procedure: CARPAL TUNNEL RELEASE;  Surgeon: Cammie Sickle., MD;  Location: [place];  Service: Orthopedics;  Laterality: Left;    Allergies  Allergen Reactions  . Codeine     Hives   . Erythromycin     hives  . Penicillins     hives   . Sulfonamide Derivatives     swelling    Family history and social history have been reviewed.  Review of Systems: Denies nausea vomiting fever weight loss  The remainder of the 10 point ROS is negative except as outlined in the H&P  Physical Exam: General Appearance Well developed, in no distress Eyes  Non icteric  HEENT  Non traumatic, normocephalic  Mouth No lesion, tongue papillated, no cheilosis Neck Supple without adenopathy, thyroid not enlarged, no carotid bruits, no JVD Lungs Clear to auscultation bilaterally COR Normal S1, normal S2, regular rhythm, no murmur, quiet precordium Abdomen soft nontender with normoactive bowel sounds. Liver edge at costal margin. I cannot reproduce her pain. There is no distention or tympany. Rectal not done Extremities  No pedal edema Skin No lesions Neurological Alert and oriented x 3 Psychological Normal mood and affect  Assessment and Plan:   Problem #5 58 year old African-American female with transient right middle and upper quadrant abdominal discomfort most likely related to constipation. I have advised her to stay on a high-fiber diet and take stool softeners on an as necessary basis. She is due for a screening colonoscopy and will schedule it for January 2016. We have discussed the prep as well as sedation.   [name] 09/28/2014

## 2014-09-28 NOTE — Patient Instructions (Signed)
You have been scheduled for a colonoscopy. Please follow written instructions given to you at your visit today.  Please pick up your prep kit at the pharmacy within the next 1-3 days. If you use inhalers (even only as needed), please bring them with you on the day of your procedure. Your physician has requested that you go to www.startemmi.com and enter the access code given to you at your visit today. This web site gives a general overview about your procedure. However, you should still follow specific instructions given to you by our office regarding your preparation for the procedure.  Please purchase the following medications over the counter and take as directed: Stool Softeners -Take as needed for constipation  Please follow a high fiber diet. A brochure with this information has been given to you today.  CC:Dr Robyn Haber

## 2014-10-03 ENCOUNTER — Ambulatory Visit (HOSPITAL_COMMUNITY)
Admission: RE | Admit: 2014-10-03 | Discharge: 2014-10-03 | Disposition: A | Discharge status: Home / Self Care | Payer: No Typology Code available for payment source | Source: Ambulatory Visit | Attending: Family Medicine | Admitting: Family Medicine

## 2014-10-03 DIAGNOSIS — Z1231 Encounter for screening mammogram for malignant neoplasm of breast: Secondary | ICD-10-CM | POA: Diagnosis not present

## 2014-10-10 ENCOUNTER — Ambulatory Visit: Payer: No Typology Code available for payment source | Admitting: Obstetrics

## 2014-10-17 ENCOUNTER — Other Ambulatory Visit: Payer: Self-pay | Admitting: Physician Assistant

## 2014-10-18 ENCOUNTER — Ambulatory Visit (INDEPENDENT_AMBULATORY_CARE_PROVIDER_SITE_OTHER): Payer: 59

## 2014-10-18 ENCOUNTER — Ambulatory Visit (INDEPENDENT_AMBULATORY_CARE_PROVIDER_SITE_OTHER): Payer: 59 | Admitting: Family Medicine

## 2014-10-18 ENCOUNTER — Encounter: Payer: Self-pay | Admitting: Family Medicine

## 2014-10-18 VITALS — BP 144/82 | HR 70 | Temp 97.9°F | Resp 16 | Ht 65.5 in | Wt 197.0 lb

## 2014-10-18 DIAGNOSIS — Z0000 Encounter for general adult medical examination without abnormal findings: Secondary | ICD-10-CM

## 2014-10-18 DIAGNOSIS — M79675 Pain in left toe(s): Secondary | ICD-10-CM

## 2014-10-18 DIAGNOSIS — M1990 Unspecified osteoarthritis, unspecified site: Secondary | ICD-10-CM

## 2014-10-18 DIAGNOSIS — I10 Essential (primary) hypertension: Secondary | ICD-10-CM

## 2014-10-18 LAB — COMPLETE METABOLIC PANEL WITH GFR
ALT: 18 U/L (ref 0–35)
AST: 19 U/L (ref 0–37)
Albumin: 4.2 g/dL (ref 3.5–5.2)
Alkaline Phosphatase: 52 U/L (ref 39–117)
BUN: 12 mg/dL (ref 6–23)
CO2: 28 mEq/L (ref 19–32)
Calcium: 9.2 mg/dL (ref 8.4–10.5)
Chloride: 105 mEq/L (ref 96–112)
Creat: 0.74 mg/dL (ref 0.50–1.10)
GFR, Est African American: >89 mL/min
GFR, Est Non African American: 89 mL/min
Glucose, Bld: 102 mg/dL — ABNORMAL HIGH (ref 70–99)
Potassium: 4.4 mEq/L (ref 3.5–5.3)
Sodium: 141 mEq/L (ref 135–145)
Total Bilirubin: 0.5 mg/dL (ref 0.2–1.2)
Total Protein: 6.8 g/dL (ref 6.0–8.3)

## 2014-10-18 LAB — LIPID PANEL
Cholesterol: 255 mg/dL — ABNORMAL HIGH (ref 0–200)
HDL: 63 mg/dL (ref >39)
LDL Cholesterol: 174 mg/dL — ABNORMAL HIGH (ref 0–99)
Total CHOL/HDL Ratio: 4 Ratio
Triglycerides: 91 mg/dL (ref <150)
VLDL: 18 mg/dL (ref 0–40)

## 2014-10-18 MED ORDER — DICLOFENAC SODIUM 1 % TD GEL: 2.0000 g | Freq: Four times a day (QID) | TRANSDERMAL | Status: DC

## 2014-10-18 MED ORDER — DICLOFENAC SODIUM 75 MG PO TBEC: 75.0000 mg | DELAYED_RELEASE_TABLET | Freq: Two times a day (BID) | ORAL | Status: DC

## 2014-10-18 NOTE — Progress Notes (Signed)
° °  Subjective:    Patient ID: Rachael Garcia, female    DOB: 05-26-1956, 59 y.o.   MRN: [id number] This chart was scribed for Robyn Haber, MD by Zola Button, Medical Scribe. This patient was seen in Room 26 and the patient's care was started at 3:44 PM.   HPI HPI Comments: Rachael Garcia is a 59 y.o. female who presents to the [facility] for a complete physical exam. Patient feels good overall, but she is in pain some days. She states that the funny bone on her right arm has been hurting over 2 months after hitting it on something. Patient also reports a pain to the medial aspect of her left big toe. She also reports having back pain and neck pain. She has been taking ibuprofen for her pain but with minimal relief. Patient has been walking for exercise, but she sometimes has pain when walking. She plans to have a colonoscopy next month. Patient just had a mammogram two weeks ago.   Review of Systems  Musculoskeletal: Positive for myalgias, back pain, arthralgias and neck pain.       Objective:   Physical Exam CONSTITUTIONAL: Well developed/well nourished HEAD: Normocephalic/atraumatic EYES: EOM/PERRL ENMT: Mucous membranes moist NECK: supple no meningeal signs SPINE: entire spine nontender CV: S1/S2 noted, no murmurs/rubs/gallops noted LUNGS: Lungs are clear to auscultation bilaterally, no apparent distress ABDOMEN: soft, nontender, no rebound or guarding GU: no cva tenderness NEURO: Pt is awake/alert, moves all extremitiesx4 EXTREMITIES: pulses normal, full ROM SKIN: warm, color normal PSYCH: no abnormalities of mood noted Breast Exam: normal  [facility] reading (PRIMARY) by  Dr. Joseph Art:  Negative toe film.       Assessment & Plan:      This chart was scribed in my presence and reviewed by me personally.    ICD-9-CM ICD-10-CM   1. Annual physical exam V70.0 Z00.00 Sedimentation rate     CBC with Differential     COMPLETE METABOLIC PANEL WITH GFR       Lipid panel     DG Toe Great Left     Ambulatory referral to Gastroenterology  2. Toe pain, left 729.5 M79.675 DG Toe Great Left  3. Essential hypertension, benign 401.1 I10 CBC with Differential     COMPLETE METABOLIC PANEL WITH GFR  4. Arthritis 716.90 M19.90 diclofenac (VOLTAREN) 75 MG EC tablet     diclofenac sodium (VOLTAREN) 1 % GEL     Signed, Robyn Haber, MD

## 2014-10-18 NOTE — Patient Instructions (Signed)
Rachael Garcia or Rachael Garcia for dentist    Health Maintenance Adopting a healthy lifestyle and getting preventive care can go a long way to promote health and wellness. Talk with your health care provider about what schedule of regular examinations is right for you. This is a good chance for you to check in with your provider about disease prevention and staying healthy. In between checkups, there are plenty of things you can do on your own. Experts have done a lot of research about which lifestyle changes and preventive measures are most likely to keep you healthy. Ask your health care provider for more information. WEIGHT AND DIET  Eat a healthy diet  Be sure to include plenty of vegetables, fruits, low-fat dairy products, and lean protein.  Do not eat a lot of foods high in solid fats, added sugars, or salt.  Get regular exercise. This is one of the most important things you can do for your health.  Most adults should exercise for at least 150 minutes each week. The exercise should increase your heart rate and make you sweat (moderate-intensity exercise).  Most adults should also do strengthening exercises at least twice a week. This is in addition to the moderate-intensity exercise.  Maintain a healthy weight  Body mass index (BMI) is a measurement that can be used to identify possible weight problems. It estimates body fat based on height and weight. Your health care provider can help determine your BMI and help you achieve or maintain a healthy weight.  For females 41 years of age and older:   A BMI below 18.5 is considered underweight.  A BMI of 18.5 to 24.9 is normal.  A BMI of 25 to 29.9 is considered overweight.  A BMI of 30 and above is considered obese.  Watch levels of cholesterol and blood lipids  You should start having your blood tested for lipids and cholesterol at 60 years of age, then have this test every 5 years.  You may need to have your cholesterol levels  checked more often if:  Your lipid or cholesterol levels are high.  You are older than 60 years of age.  You are at high risk for heart disease.  CANCER SCREENING   Lung Cancer  Lung cancer screening is recommended for adults 71-9 years old who are at high risk for lung cancer because of a history of smoking.  A yearly low-dose CT scan of the lungs is recommended for people who:  Currently smoke.  Have quit within the past 15 years.  Have at least a 30-pack-year history of smoking. A pack year is smoking an average of one pack of cigarettes a day for 1 year.  Yearly screening should continue until it has been 15 years since you quit.  Yearly screening should stop if you develop a health problem that would prevent you from having lung cancer treatment.  Breast Cancer  Practice breast self-awareness. This means understanding how your breasts normally appear and feel.  It also means doing regular breast self-exams. Let your health care provider know about any changes, no matter how small.  If you are in your 20s or 30s, you should have a clinical breast exam (CBE) by a health care provider every 1-3 years as part of a regular health exam.  If you are 80 or older, have a CBE every year. Also consider having a breast X-ray (mammogram) every year.  If you have a family history of breast cancer, talk to your health  care provider about genetic screening.  If you are at high risk for breast cancer, talk to your health care provider about having an MRI and a mammogram every year.  Breast cancer gene (BRCA) assessment is recommended for women who have family members with BRCA-related cancers. BRCA-related cancers include:  Breast.  Ovarian.  Tubal.  Peritoneal cancers.  Results of the assessment will determine the need for genetic counseling and BRCA1 and BRCA2 testing. Cervical Cancer Routine pelvic examinations to screen for cervical cancer are no longer recommended for  nonpregnant women who are considered low risk for cancer of the pelvic organs (ovaries, uterus, and vagina) and who do not have symptoms. A pelvic examination may be necessary if you have symptoms including those associated with pelvic infections. Ask your health care provider if a screening pelvic exam is right for you.   The Pap test is the screening test for cervical cancer for women who are considered at risk.  If you had a hysterectomy for a problem that was not cancer or a condition that could lead to cancer, then you no longer need Pap tests.  If you are older than 65 years, and you have had normal Pap tests for the past 10 years, you no longer need to have Pap tests.  If you have had past treatment for cervical cancer or a condition that could lead to cancer, you need Pap tests and screening for cancer for at least 20 years after your treatment.  If you no longer get a Pap test, assess your risk factors if they change (such as having a new sexual partner). This can affect whether you should start being screened again.  Some women have medical problems that increase their chance of getting cervical cancer. If this is the case for you, your health care provider may recommend more frequent screening and Pap tests.  The human papillomavirus (HPV) test is another test that may be used for cervical cancer screening. The HPV test looks for the virus that can cause cell changes in the cervix. The cells collected during the Pap test can be tested for HPV.  The HPV test can be used to screen women 43 years of age and older. Getting tested for HPV can extend the interval between normal Pap tests from three to five years.  An HPV test also should be used to screen women of any age who have unclear Pap test results.  After 60 years of age, women should have HPV testing as often as Pap tests.  Colorectal Cancer  This type of cancer can be detected and often prevented.  Routine colorectal cancer  screening usually begins at 60 years of age and continues through 60 years of age.  Your health care provider may recommend screening at an earlier age if you have risk factors for colon cancer.  Your health care provider may also recommend using home test kits to check for hidden blood in the stool.  A small camera at the end of a tube can be used to examine your colon directly (sigmoidoscopy or colonoscopy). This is done to check for the earliest forms of colorectal cancer.  Routine screening usually begins at age 31.  Direct examination of the colon should be repeated every 5-10 years through 60 years of age. However, you may need to be screened more often if early forms of precancerous polyps or small growths are found. Skin Cancer  Check your skin from head to toe regularly.  Tell your health  care provider about any new moles or changes in moles, especially if there is a change in a mole's shape or color.  Also tell your health care provider if you have a mole that is larger than the size of a pencil eraser.  Always use sunscreen. Apply sunscreen liberally and repeatedly throughout the day.  Protect yourself by wearing long sleeves, pants, a wide-brimmed hat, and sunglasses whenever you are outside. HEART DISEASE, DIABETES, AND HIGH BLOOD PRESSURE   Have your blood pressure checked at least every 1-2 years. High blood pressure causes heart disease and increases the risk of stroke.  If you are between 55 years and 87 years old, ask your health care provider if you should take aspirin to prevent strokes.  Have regular diabetes screenings. This involves taking a blood sample to check your fasting blood sugar level.  If you are at a normal weight and have a low risk for diabetes, have this test once every three years after 61 years of age.  If you are overweight and have a high risk for diabetes, consider being tested at a younger age or more often. PREVENTING INFECTION  Hepatitis  B  If you have a higher risk for hepatitis B, you should be screened for this virus. You are considered at high risk for hepatitis B if:  You were born in a country where hepatitis B is common. Ask your health care provider which countries are considered high risk.  Your parents were born in a high-risk country, and you have not been immunized against hepatitis B (hepatitis B vaccine).  You have HIV or AIDS.  You use needles to inject street drugs.  You live with someone who has hepatitis B.  You have had sex with someone who has hepatitis B.  You get hemodialysis treatment.  You take certain medicines for conditions, including cancer, organ transplantation, and autoimmune conditions. Hepatitis C  Blood testing is recommended for:  Everyone born from [birth year] through 1966.  Anyone with known risk factors for hepatitis C. Sexually transmitted infections (STIs)  You should be screened for sexually transmitted infections (STIs) including gonorrhea and chlamydia if:  You are sexually active and are younger than 60 years of age.  You are older than 60 years of age and your health care provider tells you that you are at risk for this type of infection.  Your sexual activity has changed since you were last screened and you are at an increased risk for chlamydia or gonorrhea. Ask your health care provider if you are at risk.  If you do not have HIV, but are at risk, it may be recommended that you take a prescription medicine daily to prevent HIV infection. This is called pre-exposure prophylaxis (PrEP). You are considered at risk if:  You are sexually active and do not regularly use condoms or know the HIV status of your partner(s).  You take drugs by injection.  You are sexually active with a partner who has HIV. Talk with your health care provider about whether you are at high risk of being infected with HIV. If you choose to begin PrEP, you should first be tested for HIV. You should  then be tested every 3 months for as long as you are taking PrEP.  PREGNANCY   If you are premenopausal and you may become pregnant, ask your health care provider about preconception counseling.  If you may become pregnant, take 400 to 800 micrograms (mcg) of folic acid every day.  If you want to prevent pregnancy, talk to your health care provider about birth control (contraception). OSTEOPOROSIS AND MENOPAUSE   Osteoporosis is a disease in which the bones lose minerals and strength with aging. This can result in serious bone fractures. Your risk for osteoporosis can be identified using a bone density scan.  If you are 92 years of age or older, or if you are at risk for osteoporosis and fractures, ask your health care provider if you should be screened.  Ask your health care provider whether you should take a calcium or vitamin D supplement to lower your risk for osteoporosis.  Menopause may have certain physical symptoms and risks.  Hormone replacement therapy may reduce some of these symptoms and risks. Talk to your health care provider about whether hormone replacement therapy is right for you.  HOME CARE INSTRUCTIONS   Schedule regular health, dental, and eye exams.  Stay current with your immunizations.   Do not use any tobacco products including cigarettes, chewing tobacco, or electronic cigarettes.  If you are pregnant, do not drink alcohol.  If you are breastfeeding, limit how much and how often you drink alcohol.  Limit alcohol intake to no more than 1 drink per day for nonpregnant women. One drink equals 12 ounces of beer, 5 ounces of wine, or 1 ounces of hard liquor.  Do not use street drugs.  Do not share needles.  Ask your health care provider for help if you need support or information about quitting drugs.  Tell your health care provider if you often feel depressed.  Tell your health care provider if you have ever been abused or do not feel safe at  home. Document Released: 04/13/2011 Document Revised: 02/12/2014 Document Reviewed: 08/30/2013 [facility] Patient Information 2015 [place], [place]. This information is not intended to replace advice given to you by your health care provider. Make sure you discuss any questions you have with your health care provider.

## 2014-10-19 LAB — CBC WITH DIFFERENTIAL/PLATELET
Basophils Absolute: 0.1 10*3/uL (ref 0.0–0.1)
Basophils Relative: 1 % (ref 0–1)
Eosinophils Absolute: 0.2 10*3/uL (ref 0.0–0.7)
Eosinophils Relative: 2 % (ref 0–5)
HCT: 41.2 % (ref 36.0–46.0)
Hemoglobin: 13.5 g/dL (ref 12.0–15.0)
Lymphocytes Relative: 40 % (ref 12–46)
Lymphs Abs: 3.1 10*3/uL (ref 0.7–4.0)
MCH: 28.4 pg (ref 26.0–34.0)
MCHC: 32.8 g/dL (ref 30.0–36.0)
MCV: 86.7 fL (ref 78.0–100.0)
MPV: 11.5 fL (ref 8.6–12.4)
Monocytes Absolute: 0.5 10*3/uL (ref 0.1–1.0)
Monocytes Relative: 6 % (ref 3–12)
Neutro Abs: 3.9 10*3/uL (ref 1.7–7.7)
Neutrophils Relative %: 51 % (ref 43–77)
Platelets: 268 10*3/uL (ref 150–400)
RBC: 4.75 MIL/uL (ref 3.87–5.11)
RDW: 14.5 % (ref 11.5–15.5)
WBC: 7.7 10*3/uL (ref 4.0–10.5)

## 2014-10-19 LAB — SEDIMENTATION RATE: Sed Rate: 6 mm/hr (ref 0–22)

## 2014-10-22 ENCOUNTER — Ambulatory Visit: Payer: No Typology Code available for payment source | Admitting: Obstetrics

## 2014-11-13 ENCOUNTER — Telehealth: Payer: Self-pay | Admitting: Internal Medicine

## 2014-11-14 NOTE — Telephone Encounter (Signed)
Left message on patient's phone number for patient to come today (11/14/14) to pickup their free coupon for Moviprep at the front desk.

## 2014-11-16 ENCOUNTER — Ambulatory Visit (AMBULATORY_SURGERY_CENTER): Payer: 59 | Admitting: Internal Medicine

## 2014-11-16 ENCOUNTER — Encounter: Payer: Self-pay | Admitting: Internal Medicine

## 2014-11-16 VITALS — BP 143/71 | HR 55 | Temp 96.0°F | Resp 20 | Ht 66.0 in | Wt 199.0 lb

## 2014-11-16 DIAGNOSIS — K5900 Constipation, unspecified: Secondary | ICD-10-CM

## 2014-11-16 DIAGNOSIS — R1011 Right upper quadrant pain: Secondary | ICD-10-CM

## 2014-11-16 MED ORDER — SODIUM CHLORIDE 0.9 % IV SOLN: 500.0000 mL | INTRAVENOUS | Status: DC

## 2014-11-16 NOTE — Op Note (Signed)
[place]  [address]. [place], [address]   COLONOSCOPY PROCEDURE REPORT  PATIENT: Rachael Garcia, Rachael Garcia  MR#: [id number] BIRTHDATE: [birth year] , 37  yrs. old GENDER: female ENDOSCOPIST: [place] Dragon, MD REFERRED SF:KCLE Lauenstein, M.D. PROCEDURE DATE:  11/16/2014 PROCEDURE:   Colonoscopy, screening First Screening Colonoscopy - Avg.  risk and is 50 yrs.  old or older - No.  Prior Negative Screening - Now for repeat screening. 10 or more years since last screening  History of Adenoma - Now for follow-up colonoscopy & has been > or = to 3 yrs.  N/A  Polyps Removed Today? No.  Polyps Removed Today? No.  Recommend repeat exam, <10 yrs? Polyps Removed Today? No.  Recommend repeat exam, <10 yrs? No. ASA CLASS:   Class II INDICATIONS:constipation and prior colonoscopy in October 2005 was normal.. MEDICATIONS: Monitored anesthesia care and Propofol 300 mg IV  DESCRIPTION OF PROCEDURE:   After the risks benefits and alternatives of the procedure were thoroughly explained, informed consent was obtained.  The digital rectal exam revealed no abnormalities of the rectum.   The LB [id number]  endoscope was introduced through the anus and advanced to the cecum, which was identified by both the appendix and ileocecal valve. No adverse events experienced.   The quality of the prep was good, using MoviPrep  The instrument was then slowly withdrawn as the colon was fully examined.      COLON FINDINGS: A normal appearing cecum, ileocecal valve, and appendiceal orifice were identified.  The ascending, transverse, descending, sigmoid colon, and rectum appeared unremarkable. Retroflexed views revealed no abnormalities. The time to cecum=4 minutes 55 seconds.  Withdrawal time=6 minutes 04 seconds.  The scope was withdrawn and the procedure completed. COMPLICATIONS: There were no immediate complications.  ENDOSCOPIC IMPRESSION: Normal colonoscopy  RECOMMENDATIONS: 1.   MiraLAX 9-17 g as needed for constipation Consider repeat abdominal ultrasound or CT scan if pain persists 2.  Recall colonoscopy in 10 years  eSigned:  Lafayette Dragon, MD 11/16/2014 12:54 PM   cc:   PATIENT NAME:  Rachael Garcia, Rachael Garcia MR#: [id number]

## 2014-11-16 NOTE — Patient Instructions (Signed)
Discharge instructions given. Normal exam. Resume previous medications. YOU HAD AN ENDOSCOPIC PROCEDURE TODAY AT [facility]: Refer to the procedure report that was given to you for any specific questions about what was found during the examination.  If the procedure report does not answer your questions, please call your gastroenterologist to clarify.  If you requested that your care partner not be given the details of your procedure findings, then the procedure report has been included in a sealed envelope for you to review at your convenience later.  YOU SHOULD EXPECT: Some feelings of bloating in the abdomen. Passage of more gas than usual.  Walking can help get rid of the air that was put into your GI tract during the procedure and reduce the bloating. If you had a lower endoscopy (such as a colonoscopy or flexible sigmoidoscopy) you may notice spotting of blood in your stool or on the toilet paper. If you underwent a bowel prep for your procedure, then you may not have a normal bowel movement for a few days.  DIET: Your first meal following the procedure should be a light meal and then it is ok to progress to your normal diet.  A half-sandwich or bowl of soup is an example of a good first meal.  Heavy or fried foods are harder to digest and may make you feel nauseous or bloated.  Likewise meals heavy in dairy and vegetables can cause extra gas to form and this can also increase the bloating.  Drink plenty of fluids but you should avoid alcoholic beverages for 24 hours.  ACTIVITY: Your care partner should take you home directly after the procedure.  You should plan to take it easy, moving slowly for the rest of the day.  You can resume normal activity the day after the procedure however you should NOT DRIVE or use heavy machinery for 24 hours (because of the sedation medicines used during the test).    SYMPTOMS TO REPORT IMMEDIATELY: A gastroenterologist can be reached at any hour.   During normal business hours, 8:30 AM to 5:00 PM Monday through Friday, call [phone].  After hours and on weekends, please call the GI answering service at [phone] who will take a message and have the physician on call contact you.   Following lower endoscopy (colonoscopy or flexible sigmoidoscopy):  Excessive amounts of blood in the stool  Significant tenderness or worsening of abdominal pains  Swelling of the abdomen that is new, acute  Fever of 100F or higher  FOLLOW UP: If any biopsies were taken you will be contacted by phone or by letter within the next 1-3 weeks.  Call your gastroenterologist if you have not heard about the biopsies in 3 weeks.  Our staff will call the home number listed on your records the next business day following your procedure to check on you and address any questions or concerns that you may have at that time regarding the information given to you following your procedure. This is a courtesy call and so if there is no answer at the home number and we have not heard from you through the emergency physician on call, we will assume that you have returned to your regular daily activities without incident.  SIGNATURES/CONFIDENTIALITY: You and/or your care partner have signed paperwork which will be entered into your electronic medical record.  These signatures attest to the fact that that the information above on your After Visit Summary has been reviewed and is understood.  Full responsibility of the confidentiality of this discharge information lies with you and/or your care-partner.

## 2014-11-16 NOTE — Progress Notes (Signed)
Report to PACU, RN, vss, BBS= Clear.  

## 2014-11-19 ENCOUNTER — Telehealth: Payer: Self-pay

## 2014-11-19 NOTE — Telephone Encounter (Signed)
  Follow up Call-  Call back number 11/16/2014  Post procedure Call Back phone  # [phone]     Patient questions:  Do you have a fever, pain , or abdominal swelling? No. Pain Score  0 *  Have you tolerated food without any problems? Yes.    Have you been able to return to your normal activities? Yes.    Do you have any questions about your discharge instructions: Diet   No. Medications  No. Follow up visit  No.  Do you have questions or concerns about your Care? No.  Actions: * If pain score is 4 or above: No action needed, pain <4.

## 2014-11-20 ENCOUNTER — Other Ambulatory Visit: Payer: Self-pay | Admitting: Family Medicine

## 2014-11-20 NOTE — Telephone Encounter (Signed)
Patient is requesting a one year refillable script for amLODipine (NORVASC) 10 MG tablet Last one was filled a day prior to her CPE   Surescript request in system   [phone]

## 2014-11-21 NOTE — Telephone Encounter (Signed)
Notified pt on VM RFs were sent to pharm

## 2014-11-21 NOTE — Telephone Encounter (Signed)
Dr L, do you want to OK a year's RFs?

## 2014-12-12 ENCOUNTER — Ambulatory Visit (INDEPENDENT_AMBULATORY_CARE_PROVIDER_SITE_OTHER): Payer: 59 | Admitting: Obstetrics

## 2014-12-12 ENCOUNTER — Encounter: Payer: Self-pay | Admitting: Obstetrics

## 2014-12-12 VITALS — BP 137/89 | HR 84 | Temp 97.8°F | Ht 66.0 in | Wt 198.0 lb

## 2014-12-12 DIAGNOSIS — Z780 Asymptomatic menopausal state: Secondary | ICD-10-CM | POA: Diagnosis not present

## 2014-12-12 DIAGNOSIS — R109 Unspecified abdominal pain: Secondary | ICD-10-CM

## 2014-12-12 DIAGNOSIS — D251 Intramural leiomyoma of uterus: Secondary | ICD-10-CM | POA: Diagnosis not present

## 2014-12-12 DIAGNOSIS — N898 Other specified noninflammatory disorders of vagina: Secondary | ICD-10-CM | POA: Diagnosis not present

## 2014-12-12 NOTE — Progress Notes (Signed)
Patient ID: Rachael Garcia, female   DOB: 06-22-1956, 59 y.o.   MRN: [id number]  Chief Complaint  Patient presents with  . Problem    abdominal pain     HPI Rachael Garcia is a 59 y.o. female.  Right upper abdominal pain for last 2 months.  Pain does occur sometimes after a meal.  Denies vaginal bleeding.  She is menopausal.  Has a history of fibroids.   HPI  Past Medical History  Diagnosis Date  . Heart murmur   . Hypertension   . GERD (gastroesophageal reflux disease)   . Complication of anesthesia not sure    oxygen dropped post ? hyst  . Blood dyscrasia     sickle cell trait  . Anemia   . Allergy   . Anxiety   . Arthritis   . IBS (irritable bowel syndrome)   . Hiatal hernia   . Diabetes mellitus without complication     Past Surgical History  Procedure Laterality Date  . Tubal ligation    . Dilation and curettage of uterus    . Colonoscopy    . Diagnostic laparoscopy      ovarian cyst removal  . Cesarean section  83,92    x 2  . Carpal tunnel release Right 03  . Carpal tunnel release  08/20/2011    Procedure: CARPAL TUNNEL RELEASE;  Surgeon: Cammie Sickle., MD;  Location: [facility];  Service: Orthopedics;  Laterality: Left;    Family History  Problem Relation Age of Onset  . Anesthesia problems Sister   . Arthritis Mother   . Anemia Mother   . Asthma Mother   . Diabetes Sister   . Hypertension Father   . Alcohol abuse Father   . Heart disease Father 57    defibrillator and pacemaker  . Stomach cancer Paternal Grandmother   . Multiple myeloma Maternal Grandmother     Social History History  Substance Use Topics  . Smoking status: Former Smoker -- 8 years    Types: Cigarettes    Quit date: 07/13/2011  . Smokeless tobacco: Never Used  . Alcohol Use: Yes     Comment: OCCASIONALLY WINE    Allergies  Allergen Reactions  . Codeine     Hives   . Erythromycin     hives  . Penicillins     hives  . Sulfonamide Derivatives     swelling    Current Outpatient Prescriptions  Medication Sig Dispense Refill  . amLODipine (NORVASC) 10 MG tablet Take 1 tablet (10 mg total) by mouth daily. 90 tablet 3   No current facility-administered medications for this visit.    Review of Systems Review of Systems Constitutional: negative for fatigue and weight loss Respiratory: negative for cough and wheezing Cardiovascular: negative for chest pain, fatigue and palpitations Gastrointestinal:  positive for abdominal pain.  Negative for change in bowel habits Genitourinary:negative Integument/breast: negative for nipple discharge Musculoskeletal:negative for myalgias Neurological: negative for gait problems and tremors Behavioral/Psych: negative for abusive relationship, depression Endocrine: negative for temperature intolerance     Blood pressure 137/89, pulse 84, temperature 97.8 F (36.6 C), height 5' 6" (1.676 m), weight 198 lb (89.812 kg).  Physical Exam Physical Exam General:   alert  Skin:   no rash or abnormalities  Lungs:   clear to auscultation bilaterally  Heart:   regular rate and rhythm, S1, S2 normal, no murmur, click, rub or gallop  Breasts:   normal without suspicious  masses, skin or nipple changes or axillary nodes  Abdomen:  normal findings: no organomegaly. Soft, tender over gallbladder  Pelvis:ga  External genitalia: normal general appearance Urinary system: urethral meatus normal and bladder without fullness, nontender Vaginal: normal without tenderness, induration or masses.  Grey, thin discharge Cervix: normal appearance Adnexa: normal bimanual exam Uterus: anteverted and non-tender, normal size      Data Reviewed Radiology  Assessment     RUQ pain.  R/O Cholecystitis. H/O fibroids.  Stable.  Menopausal state.     Plan    Gallbladder ultrasound ordered. Pelvic ultrasound ordered Wet prep sent.   Orders Placed This Encounter  Procedures  . SureSwab Bacterial Vaginosis/itis  .  US Pelvis Complete    Standing Status: Future     Number of Occurrences:      Standing Expiration Date: 02/11/2016    Order Specific Question:  Reason for Exam (SYMPTOM  OR DIAGNOSIS REQUIRED)    Answer:  abdominal pain    Order Specific Question:  Preferred imaging location?    Answer:  [facility]  . US Transvaginal Non-OB    Standing Status: Future     Number of Occurrences:      Standing Expiration Date: 02/11/2016    Order Specific Question:  Reason for Exam (SYMPTOM  OR DIAGNOSIS REQUIRED)    Answer:  abdominal pain    Order Specific Question:  Preferred imaging location?    Answer:  [facility]  . US Abdomen Complete    Standing Status: Future     Number of Occurrences:      Standing Expiration Date: 02/11/2016    Order Specific Question:  Reason for Exam (SYMPTOM  OR DIAGNOSIS REQUIRED)    Answer:  Right upper quadrant abdominal pain.  R/O cholecystitis.    Order Specific Question:  Preferred imaging location?    Answer:  [facility]   No orders of the defined types were placed in this encounter.

## 2014-12-16 LAB — SURESWAB BACTERIAL VAGINOSIS/ITIS
Atopobium vaginae: NOT DETECTED Log (cells/mL)
C. albicans, DNA: NOT DETECTED
C. glabrata, DNA: NOT DETECTED
C. parapsilosis, DNA: NOT DETECTED
C. tropicalis, DNA: NOT DETECTED
Gardnerella vaginalis: 6.1 Log (cells/mL)
LACTOBACILLUS SPECIES: NOT DETECTED Log (cells/mL)
MEGASPHAERA SPECIES: NOT DETECTED Log (cells/mL)
T. vaginalis RNA, QL TMA: NOT DETECTED

## 2014-12-17 ENCOUNTER — Other Ambulatory Visit: Payer: Self-pay | Admitting: Obstetrics

## 2014-12-17 DIAGNOSIS — B9689 Other specified bacterial agents as the cause of diseases classified elsewhere: Secondary | ICD-10-CM

## 2014-12-17 DIAGNOSIS — N760 Acute vaginitis: Principal | ICD-10-CM

## 2014-12-17 MED ORDER — TINIDAZOLE 500 MG PO TABS: 1000.0000 mg | ORAL_TABLET | Freq: Every day | ORAL | Status: DC

## 2014-12-20 ENCOUNTER — Ambulatory Visit (HOSPITAL_COMMUNITY): Admission: RE | Admit: 2014-12-20 | Payer: 59 | Source: Ambulatory Visit

## 2014-12-20 ENCOUNTER — Other Ambulatory Visit: Payer: Self-pay | Admitting: *Deleted

## 2014-12-20 ENCOUNTER — Ambulatory Visit (HOSPITAL_COMMUNITY): Payer: 59

## 2014-12-20 DIAGNOSIS — B9689 Other specified bacterial agents as the cause of diseases classified elsewhere: Secondary | ICD-10-CM

## 2014-12-20 DIAGNOSIS — N760 Acute vaginitis: Principal | ICD-10-CM

## 2014-12-20 MED ORDER — METRONIDAZOLE 500 MG PO TABS: 500.0000 mg | ORAL_TABLET | Freq: Two times a day (BID) | ORAL | Status: DC

## 2014-12-20 NOTE — Progress Notes (Signed)
Metronidazole sent to pharmacy for +BV.  Tinidazole to be D/C due to ins coverage.

## 2014-12-27 ENCOUNTER — Telehealth: Payer: Self-pay

## 2014-12-27 ENCOUNTER — Other Ambulatory Visit: Payer: Self-pay | Admitting: Family Medicine

## 2014-12-27 DIAGNOSIS — H9191 Unspecified hearing loss, right ear: Secondary | ICD-10-CM

## 2014-12-27 NOTE — Telephone Encounter (Signed)
Dr. Joseph Art you did not discuss this at the last office visit. Should pt RTC?

## 2014-12-27 NOTE — Telephone Encounter (Signed)
Patient called yesterday afternoon asking for a referral to ENT specialist. She is having problems with her right ear feeling clogged up. 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 Can something be called in? Can she still get referral also?

## 2015-01-24 ENCOUNTER — Other Ambulatory Visit: Payer: Self-pay | Admitting: Family Medicine

## 2015-03-28 ENCOUNTER — Ambulatory Visit (INDEPENDENT_AMBULATORY_CARE_PROVIDER_SITE_OTHER): Payer: 59 | Admitting: Family Medicine

## 2015-03-28 VITALS — BP 108/80 | HR 77 | Temp 98.0°F | Resp 18 | Ht 65.0 in | Wt 197.1 lb

## 2015-03-28 DIAGNOSIS — L259 Unspecified contact dermatitis, unspecified cause: Secondary | ICD-10-CM | POA: Diagnosis not present

## 2015-03-28 DIAGNOSIS — R51 Headache: Secondary | ICD-10-CM

## 2015-03-28 DIAGNOSIS — I10 Essential (primary) hypertension: Secondary | ICD-10-CM

## 2015-03-28 DIAGNOSIS — R519 Headache, unspecified: Secondary | ICD-10-CM

## 2015-03-28 LAB — POCT CBC
GRANULOCYTE PERCENT: 51.9 % (ref 37–80)
HCT, POC: 42.3 % (ref 37.7–47.9)
Hemoglobin: 13.2 g/dL (ref 12.2–16.2)
Lymph, poc: 3 (ref 0.6–3.4)
MCH, POC: 27.1 pg (ref 27–31.2)
MCHC: 31.1 g/dL — AB (ref 31.8–35.4)
MCV: 87.1 fL (ref 80–97)
MID (CBC): 0.5 (ref 0–0.9)
MPV: 7.9 fL (ref 0–99.8)
PLATELET COUNT, POC: 251 10*3/uL (ref 142–424)
POC GRANULOCYTE: 3.8 (ref 2–6.9)
POC LYMPH %: 41.6 % (ref 10–50)
POC MID %: 6.5 %M (ref 0–12)
RBC: 4.86 M/uL (ref 4.04–5.48)
RDW, POC: 15.4 %
WBC: 7.3 10*3/uL (ref 4.6–10.2)

## 2015-03-28 MED ORDER — HYDROCORTISONE 2.5 % EX OINT: TOPICAL_OINTMENT | Freq: Two times a day (BID) | CUTANEOUS | Status: DC

## 2015-03-28 NOTE — Progress Notes (Signed)
Subjective:    Patient ID: Rachael Garcia, female    DOB: Jan 18, 1956, 59 y.o.   MRN: [id number]  03/28/2015  Blood Pressure Check; Rash; and temple sensitivity   HPI This 59 y.o. female presents for evaluation of the following:  1. Rash anterior chest L: onset one month ago.  Went away after hydrocortisone cream OTC.Then stopped hydrocortisone cream and recurred; now using cream intermittently but stopped due to lack of improvement.  Very itchy.  Wears a lot of jewelry but no new jewelry.  No insect bites.  No new soaps or detergents.  No new perfumes.  Uses Dove or Dial soap.  No perfume on neck. Uses Vaseline for moisturizer.  Similar rash last summer with improvement with hydrocortisone.  Will get worse if eats abundant tomatoes.  Does work in yard  2.  HTN:  With temple pain that developed today, worried about BP.  Patient reports good compliance with medication, good tolerance to medication, and good symptom control. Denies CP/palp/SOB/leg swelling.  3. R temple soreness; sore and burning on R side of face/temple.  Onset today.  No fever/chills/sweats.  No rash.  No ear pain; no eye tearing, discharge, redness; no foreign body sensation; R temple feels weird when trying to blink; no pain with chewing.  +grinds teeth at nighttime.     Review of Systems  Constitutional: Negative for fever, chills, diaphoresis and fatigue.  HENT: Negative for congestion, dental problem, drooling, ear discharge, ear pain, facial swelling, hearing loss, mouth sores, nosebleeds, postnasal drip, rhinorrhea, sinus pressure, sore throat, tinnitus, trouble swallowing and voice change.   Eyes: Negative for photophobia, pain, discharge, redness, itching and visual disturbance.  Respiratory: Negative for cough and shortness of breath.   Cardiovascular: Negative for chest pain, palpitations and leg swelling.  Skin: Positive for color change and rash. Negative for pallor and wound.  Neurological: Negative for  dizziness, seizures, facial asymmetry, weakness, light-headedness, numbness and headaches.    Past Medical History  Diagnosis Date  . Heart murmur   . Hypertension   . GERD (gastroesophageal reflux disease)   . Complication of anesthesia not sure    oxygen dropped post ? hyst  . Blood dyscrasia     sickle cell trait  . Anemia   . Allergy   . Anxiety   . Arthritis   . IBS (irritable bowel syndrome)   . Hiatal hernia   . Diabetes mellitus without complication    Past Surgical History  Procedure Laterality Date  . Tubal ligation    . Dilation and curettage of uterus    . Colonoscopy    . Diagnostic laparoscopy      ovarian cyst removal  . Cesarean section  83,92    x 2  . Carpal tunnel release Right 03  . Carpal tunnel release  08/20/2011    Procedure: CARPAL TUNNEL RELEASE;  Surgeon: Wyn Forster., MD;  Location: [facility];  Service: Orthopedics;  Laterality: Left;   Allergies  Allergen Reactions  . Codeine     Hives   . Erythromycin     hives  . Penicillins     hives  . Sulfonamide Derivatives     swelling   Current Outpatient Prescriptions  Medication Sig Dispense Refill  . amLODipine (NORVASC) 10 MG tablet Take 1 tablet (10 mg total) by mouth daily. 90 tablet 3  . loratadine (CLARITIN) 10 MG tablet Take 1 tablet (10 mg total) by mouth daily. 30 tablet 4  .  hydrocortisone 2.5 % ointment Apply topically 2 (two) times daily. 30 g 1  . metroNIDAZOLE (FLAGYL) 500 MG tablet Take 1 tablet (500 mg total) by mouth 2 (two) times daily. (Patient not taking: Reported on 03/28/2015) 14 tablet 0   No current facility-administered medications for this visit.   History   Social History  . Marital Status: Married    Spouse Name: N/A  . Number of Children: 2  . Years of Education: N/A   Occupational History  . insurance agent/broker    Social History Main Topics  . Smoking status: Former Smoker -- 8 years    Types: Cigarettes    Quit date:  07/13/2011  . Smokeless tobacco: Never Used  . Alcohol Use: 0.0 oz/week    0 Standard drinks or equivalent per week     Comment: OCCASIONALLY WINE  . Drug Use: No  . Sexual Activity: Not Currently    Birth Control/ Protection: Post-menopausal   Other Topics Concern  . Not on file   Social History Narrative   Family History  Problem Relation Age of Onset  . Anesthesia problems Sister   . Arthritis Mother   . Anemia Mother   . Asthma Mother   . Diabetes Sister   . Hypertension Father   . Alcohol abuse Father   . Heart disease Father 81    defibrillator and pacemaker  . Stomach cancer Paternal Grandmother   . Multiple myeloma Maternal Grandmother         Objective:    BP 108/80 mmHg  Pulse 77  Temp(Src) 98 F (36.7 C) (Oral)  Resp 18  Ht 5\' 5"  (1.651 m)  Wt 197 lb 2 oz (89.415 kg)  BMI 32.80 kg/m2  SpO2 98% Physical Exam  Constitutional: She is oriented to person, place, and time. She appears well-developed and well-nourished. No distress.  HENT:  Head: Normocephalic and atraumatic.  Eyes: Conjunctivae and EOM are normal. Pupils are equal, round, and reactive to light.  Neck: Normal range of motion. Neck supple. No thyromegaly present.  Cardiovascular: Normal rate, regular rhythm and normal heart sounds.  Exam reveals no gallop and no friction rub.   No murmur heard. Pulmonary/Chest: Effort normal and breath sounds normal. She has no wheezes. She has no rales.  Lymphadenopathy:    She has no cervical adenopathy.  Neurological: She is alert and oriented to person, place, and time.  Skin: Skin is warm and dry. Rash noted. She is not diaphoretic.     Diffuse erythematous rash 4cm diameter with scaling.  Psychiatric: She has a normal mood and affect. Her behavior is normal.  Nursing note and vitals reviewed.  Results for orders placed or performed in visit on 03/28/15  POCT CBC  Result Value Ref Range   WBC 7.3 4.6 - 10.2 K/uL   Lymph, poc 3.0 0.6 - 3.4    POC LYMPH PERCENT 41.6 10 - 50 %L   MID (cbc) 0.5 0 - 0.9   POC MID % 6.5 0 - 12 %M   POC Granulocyte 3.8 2 - 6.9   Granulocyte percent 51.9 37 - 80 %G   RBC 4.86 4.04 - 5.48 M/uL   Hemoglobin 13.2 12.2 - 16.2 g/dL   HCT, POC 95.2 84.1 - 47.9 %   MCV 87.1 80 - 97 fL   MCH, POC 27.1 27 - 31.2 pg   MCHC 31.1 (A) 31.8 - 35.4 g/dL   RDW, POC 32.4 %   Platelet Count, POC 251 142 -  424 K/uL   MPV 7.9 0 - 99.8 fL       Assessment & Plan:   1. Contact dermatitis   2. Temple tenderness   3. Essential hypertension, benign     1. Contact dermatitis: New. Rx for hydrocortisone ointment 2.5% bid.  Continue Claritin daily. 2.  Temple tenderness R:  New.  Benign exam in office; no visual symptoms; reports teeth grinding at night; recommend mouth guard; will obtain ESR yet doubt temporal arteritis with onset today and mild symptoms.  RTC immediately for acute worsening of symptoms. 3.  HTN: controlled; no changes to management.   Meds ordered this encounter  Medications  . hydrocortisone 2.5 % ointment    Sig: Apply topically 2 (two) times daily.    Dispense:  30 g    Refill:  1    No Follow-up on file.    Terasa Orsini Paulita Fujita, M.D. [facility] [address] [place], [place]  [address] [phone] phone [phone] fax

## 2015-03-28 NOTE — Patient Instructions (Signed)
1. Please return for worsening R temple pain.  Contact Dermatitis Contact dermatitis is a reaction to certain substances that touch the skin. Contact dermatitis can be either irritant contact dermatitis or allergic contact dermatitis. Irritant contact dermatitis does not require previous exposure to the substance for a reaction to occur.Allergic contact dermatitis only occurs if you have been exposed to the substance before. Upon a repeat exposure, your body reacts to the substance.  CAUSES  Many substances can cause contact dermatitis. Irritant dermatitis is most commonly caused by repeated exposure to mildly irritating substances, such as:  Makeup.  Soaps.  Detergents.  Bleaches.  Acids.  Metal salts, such as nickel. Allergic contact dermatitis is most commonly caused by exposure to:  Poisonous plants.  Chemicals (deodorants, shampoos).  Jewelry.  Latex.  Neomycin in triple antibiotic cream.  Preservatives in products, including clothing. SYMPTOMS  The area of skin that is exposed may develop:  Dryness or flaking.  Redness.  Cracks.  Itching.  Pain or a burning sensation.  Blisters. With allergic contact dermatitis, there may also be swelling in areas such as the eyelids, mouth, or genitals.  DIAGNOSIS  Your caregiver can usually tell what the problem is by doing a physical exam. In cases where the cause is uncertain and an allergic contact dermatitis is suspected, a patch skin test may be performed to help determine the cause of your dermatitis. TREATMENT Treatment includes protecting the skin from further contact with the irritating substance by avoiding that substance if possible. Barrier creams, powders, and gloves may be helpful. Your caregiver may also recommend:  Steroid creams or ointments applied 2 times daily. For best results, soak the rash area in cool water for 20 minutes. Then apply the medicine. Cover the area with a plastic wrap. You can store the  steroid cream in the refrigerator for a "chilly" effect on your rash. That may decrease itching. Oral steroid medicines may be needed in more severe cases.  Antibiotics or antibacterial ointments if a skin infection is present.  Antihistamine lotion or an antihistamine taken by mouth to ease itching.  Lubricants to keep moisture in your skin.  Burow's solution to reduce redness and soreness or to dry a weeping rash. Mix one packet or tablet of solution in 2 cups cool water. Dip a clean washcloth in the mixture, wring it out a bit, and put it on the affected area. Leave the cloth in place for 30 minutes. Do this as often as possible throughout the day.  Taking several cornstarch or baking soda baths daily if the area is too large to cover with a washcloth. Harsh chemicals, such as alkalis or acids, can cause skin damage that is like a burn. You should flush your skin for 15 to 20 minutes with cold water after such an exposure. You should also seek immediate medical care after exposure. Bandages (dressings), antibiotics, and pain medicine may be needed for severely irritated skin.  HOME CARE INSTRUCTIONS  Avoid the substance that caused your reaction.  Keep the area of skin that is affected away from hot water, soap, sunlight, chemicals, acidic substances, or anything else that would irritate your skin.  Do not scratch the rash. Scratching may cause the rash to become infected.  You may take cool baths to help stop the itching.  Only take over-the-counter or prescription medicines as directed by your caregiver.  See your caregiver for follow-up care as directed to make sure your skin is healing properly. SEEK MEDICAL CARE IF:  Your condition is not better after 3 days of treatment.  You seem to be getting worse.  You see signs of infection such as swelling, tenderness, redness, soreness, or warmth in the affected area.  You have any problems related to your medicines. Document  Released: 09/25/2000 Document Revised: 12/21/2011 Document Reviewed: 03/03/2011 [facility] Patient Information 2015 [place], [place]. This information is not intended to replace advice given to you by your health care provider. Make sure you discuss any questions you have with your health care provider.

## 2015-03-29 LAB — COMPREHENSIVE METABOLIC PANEL
ALT: 21 U/L (ref 0–35)
AST: 23 U/L (ref 0–37)
Albumin: 4.2 g/dL (ref 3.5–5.2)
Alkaline Phosphatase: 49 U/L (ref 39–117)
BILIRUBIN TOTAL: 0.5 mg/dL (ref 0.2–1.2)
BUN: 13 mg/dL (ref 6–23)
CALCIUM: 9.2 mg/dL (ref 8.4–10.5)
CHLORIDE: 104 meq/L (ref 96–112)
CO2: 26 meq/L (ref 19–32)
Creat: 0.75 mg/dL (ref 0.50–1.10)
Glucose, Bld: 79 mg/dL (ref 70–99)
Potassium: 3.8 mEq/L (ref 3.5–5.3)
SODIUM: 140 meq/L (ref 135–145)
Total Protein: 6.8 g/dL (ref 6.0–8.3)

## 2015-03-29 LAB — SEDIMENTATION RATE: SED RATE: 9 mm/h (ref 0–30)

## 2015-05-01 ENCOUNTER — Ambulatory Visit (INDEPENDENT_AMBULATORY_CARE_PROVIDER_SITE_OTHER): Payer: 59

## 2015-05-01 ENCOUNTER — Ambulatory Visit (INDEPENDENT_AMBULATORY_CARE_PROVIDER_SITE_OTHER): Payer: 59 | Admitting: Family Medicine

## 2015-05-01 VITALS — BP 132/90 | HR 82 | Temp 97.2°F | Resp 18 | Ht 65.0 in | Wt 192.5 lb

## 2015-05-01 DIAGNOSIS — M545 Low back pain, unspecified: Secondary | ICD-10-CM

## 2015-05-01 DIAGNOSIS — M542 Cervicalgia: Secondary | ICD-10-CM

## 2015-05-01 DIAGNOSIS — S161XXA Strain of muscle, fascia and tendon at neck level, initial encounter: Secondary | ICD-10-CM

## 2015-05-01 DIAGNOSIS — M6248 Contracture of muscle, other site: Secondary | ICD-10-CM

## 2015-05-01 DIAGNOSIS — M62838 Other muscle spasm: Secondary | ICD-10-CM

## 2015-05-01 MED ORDER — CYCLOBENZAPRINE HCL 5 MG PO TABS: ORAL_TABLET | ORAL | Status: DC

## 2015-05-01 MED ORDER — NAPROXEN 500 MG PO TABS: 500.0000 mg | ORAL_TABLET | Freq: Two times a day (BID) | ORAL | Status: DC

## 2015-05-01 NOTE — Progress Notes (Addendum)
Subjective:    Patient ID: Rachael Garcia, female    DOB: 26-May-1956, 59 y.o.   MRN: [id number] This chart was scribed for Merri Ray, MD by Marti Sleigh, Medical Scribe. This patient was seen in Room 4 and the patient's care was started a 8:18 PM.  Chief Complaint  Patient presents with  . Back Pain    Started having shooting pains after trying to move a tv this afternoon    HPI HPI Comments: Rachael Garcia is a 59 y.o. female with a hx of chonic neck and back pain who presents to [facility] complaining an acute exacerbation of her back and neck pain for the last hour with associated mild dizziness. Pt states she ws trying to move a TV and felt a sudden pull in her back and neck. Pt states she has been told in the past that she has a pinched nerve in her neck. Pt has worked with a physical therapist in the past for her back pain. Pt has not taken any medication for her pain. Pt denies pain radiating to arms, or weakness in arms or hands. No acute loss of bowel or bladder, or saddle numbness after her injury today.   Pt states she also has hearing loss, and tinnitus and states her hearing issues seem to get worse with pain or neck issues. These issues have been followed in the past ENT.    Patient Active Problem List   Diagnosis Date Noted  . HYPERLIPIDEMIA 08/14/2010  . ANXIETY 08/14/2010  . HYPERTENSION 08/14/2010  . RUQ PAIN 08/14/2010  . LACTOSE INTOLERANCE 08/13/2010  . SICKLE CELL TRAIT 08/13/2010  . GERD 08/13/2010  . IRRITABLE BOWEL SYNDROME 08/13/2010  . ALLERGIC RHINITIS 04/04/2007  . ASTHMA 04/04/2007   Past Medical History  Diagnosis Date  . Heart murmur   . Hypertension   . GERD (gastroesophageal reflux disease)   . Complication of anesthesia not sure    oxygen dropped post ? hyst  . Blood dyscrasia     sickle cell trait  . Anemia   . Allergy   . Anxiety   . Arthritis   . IBS (irritable bowel syndrome)   . Hiatal hernia   . Diabetes mellitus without  complication    Past Surgical History  Procedure Laterality Date  . Tubal ligation    . Dilation and curettage of uterus    . Colonoscopy    . Diagnostic laparoscopy      ovarian cyst removal  . Cesarean section  83,92    x 2  . Carpal tunnel release Right 03  . Carpal tunnel release  08/20/2011    Procedure: CARPAL TUNNEL RELEASE;  Surgeon: Cammie Sickle., MD;  Location: [facility];  Service: Orthopedics;  Laterality: Left;   Allergies  Allergen Reactions  . Codeine     Hives   . Erythromycin     hives  . Penicillins     hives  . Sulfonamide Derivatives     swelling   Prior to Admission medications   Medication Sig Start Date End Date Taking? Authorizing Provider  amLODipine (NORVASC) 10 MG tablet Take 1 tablet (10 mg total) by mouth daily. 11/21/14  Yes Robyn Haber, MD  hydrocortisone 2.5 % ointment Apply topically 2 (two) times daily. 03/28/15  Yes Wardell Honour, MD  loratadine (CLARITIN) 10 MG tablet Take 1 tablet (10 mg total) by mouth daily. 01/24/15  Yes Robyn Haber, MD  metroNIDAZOLE (FLAGYL) 500  MG tablet Take 1 tablet (500 mg total) by mouth 2 (two) times daily. Patient not taking: Reported on 03/28/2015 12/20/14   Shelly Bombard, MD   History   Social History  . Marital Status: Married    Spouse Name: N/A  . Number of Children: 2  . Years of Education: N/A   Occupational History  . insurance agent/broker    Social History Main Topics  . Smoking status: Former Smoker -- 8 years    Types: Cigarettes    Quit date: 07/13/2011  . Smokeless tobacco: Never Used  . Alcohol Use: 0.0 oz/week    0 Standard drinks or equivalent per week     Comment: OCCASIONALLY WINE  . Drug Use: No  . Sexual Activity: Not Currently    Birth Control/ Protection: Post-menopausal   Other Topics Concern  . Not on file   Social History Narrative    Review of Systems  Constitutional: Negative for fever and chills.  Musculoskeletal: Positive for back  pain, neck pain and neck stiffness.  Skin: Negative for color change and wound.  Neurological: Negative for weakness and numbness.       Objective:   Physical Exam  Constitutional: She is oriented to person, place, and time. She appears well-developed and well-nourished. No distress.  HENT:  Head: Normocephalic and atraumatic.  Eyes: Pupils are equal, round, and reactive to light.  Neck: Neck supple.  Cardiovascular: Normal rate.   Pulmonary/Chest: Effort normal. No respiratory distress.  Musculoskeletal: Normal range of motion.  Strength in hands and upper extremities intact. Tingly feeling across lower cervical spine diffusely, localized to lower cervical spine. Slight spasm in lower cervical musculature. Left paraspinal muscles with mild spasm. Decreased flexion and extension in neck. Guarded with lateral flexion of neck on right and left. Some guarding with rotation. Reflexes in biceps 2+. Brachioradialis reflex 2+ on left, 1+ on right. Reflexes 2+ at achilles and patella. Babinski negative. No midline body tenderness in the lumbar spine. Slight tenderness along the left paraspinal muscles. Negative straight leg raise. Able to heel and toe walk without difficulty. Strength in lower extremities intact.   Neurological: She is alert and oriented to person, place, and time. Coordination normal.  Skin: Skin is warm and dry. She is not diaphoretic.  Psychiatric: She has a normal mood and affect. Her behavior is normal.  Nursing note and vitals reviewed.  [facility] reading (PRIMARY) by  Dr. Carlota Raspberry: Cspine: Lower degenerative disease specifically C5-6 and C6-7 with spondylosis and osteophytes. Straightening of C-spine.     Assessment & Plan:   Rachael Garcia is a 59 y.o. female Neck pain, acute - Plan: DG Cervical Spine Complete, Muscle spasms of neck - Plan: cyclobenzaprine (FLEXERIL) 5 MG tablet, Strain of neck muscle, initial encounter - Plan: naproxen (NAPROSYN) 500 MG tablet  -Suspect acute  strain/sprain with secondary muscle spasm versus neuronal impingement on left side. Although decreased right sided reflex, this is equal to the right side. Other reflexes are equal.  -degenerative disease of lower cervical spine.  -Symptomatic care discussed with heat or ice, gentle range of motion, Flexeril up to 10 mg 3 times a day when necessary, but discussed side effects this medication and use low-dose effective dose. Additionally continue Naprosyn 500 mg twice a day when necessary with food. Cardiac risk discussed of NSAIDs, and use lowest effective dose for least amount of time as needed.   -If not improving next week return for repeat evaluation, return sooner for worsening.  Left-sided low back pain without sciatica  -Chronic low back pain by history, no midline bony tenderness or concerning signs on exam versus also no reflux. Decision made not to do imaging tonight, but if persistent symptoms or worsening in spite of symptomatic care above, consider further evaluation.   Meds ordered this encounter  Medications  . naproxen (NAPROSYN) 500 MG tablet    Sig: Take 1 tablet (500 mg total) by mouth 2 (two) times daily with a meal.    Dispense:  30 tablet    Refill:  0  . cyclobenzaprine (FLEXERIL) 5 MG tablet    Sig: 1 to 2 tabs by mouth up to every 8 hours as needed. Start with one pill by mouth each bedtime as needed due to sedation    Dispense:  30 tablet    Refill:  0   Patient Instructions  I suspect you have pulled a muscle in your neck or possible pinched nerve from lifting the TV today. This may has also flared up the pain in your low back.  Range of motion and stretches initially can be helpful. See information on handout below. Ice on and off for 10 minutes to sore areas may be helpful or heat if this feels better. Flexeril 5 mg 1-2 up to every 8 hours, but this can cause sedation so started bedtime and use lowest effective dose. You can also take the naproxen 1-2 once per day to  treat inflammation and pain, take this with food and lowest effective dose for only as needed for acute pain. If your symptoms are not improving into the next 5-7 days, return for repeat evaluation. Sooner if worse.  Return to the clinic or go to the nearest emergency room if any of your symptoms worsen or new symptoms occur.   Cervical Sprain A cervical sprain is an injury in the neck in which the strong, fibrous tissues (ligaments) that connect your neck bones stretch or tear. Cervical sprains can range from mild to severe. Severe cervical sprains can cause the neck vertebrae to be unstable. This can lead to damage of the spinal cord and can result in serious nervous system problems. The amount of time it takes for a cervical sprain to get better depends on the cause and extent of the injury. Most cervical sprains heal in 1 to 3 weeks. CAUSES  Severe cervical sprains may be caused by:   Contact sport injuries (such as from football, rugby, wrestling, hockey, auto racing, gymnastics, diving, martial arts, or boxing).   Motor vehicle collisions.   Whiplash injuries. This is an injury from a sudden forward and backward whipping movement of the head and neck.  Falls.  Mild cervical sprains may be caused by:   Being in an awkward position, such as while cradling a telephone between your ear and shoulder.   Sitting in a chair that does not offer proper support.   Working at a poorly Landscape architect station.   Looking up or down for long periods of time.  SYMPTOMS   Pain, soreness, stiffness, or a burning sensation in the front, back, or sides of the neck. This discomfort may develop immediately after the injury or slowly, 24 hours or more after the injury.   Pain or tenderness directly in the middle of the back of the neck.   Shoulder or upper back pain.   Limited ability to move the neck.   Headache.   Dizziness.   Weakness, numbness, or tingling in the hands  or  arms.   Muscle spasms.   Difficulty swallowing or chewing.   Tenderness and swelling of the neck.  DIAGNOSIS  Most of the time your health care provider can diagnose a cervical sprain by taking your history and doing a physical exam. Your health care provider will ask about previous neck injuries and any known neck problems, such as arthritis in the neck. X-rays may be taken to find out if there are any other problems, such as with the bones of the neck. Other tests, such as a CT scan or MRI, may also be needed.  TREATMENT  Treatment depends on the severity of the cervical sprain. Mild sprains can be treated with rest, keeping the neck in place (immobilization), and pain medicines. Severe cervical sprains are immediately immobilized. Further treatment is done to help with pain, muscle spasms, and other symptoms and may include:  Medicines, such as pain relievers, numbing medicines, or muscle relaxants.   Physical therapy. This may involve stretching exercises, strengthening exercises, and posture training. Exercises and improved posture can help stabilize the neck, strengthen muscles, and help stop symptoms from returning.  HOME CARE INSTRUCTIONS   Put ice on the injured area.   Put ice in a plastic bag.   Place a towel between your skin and the bag.   Leave the ice on for 15-20 minutes, 3-4 times a day.   If your injury was severe, you may have been given a cervical collar to wear. A cervical collar is a two-piece collar designed to keep your neck from moving while it heals.  Do not remove the collar unless instructed by your health care provider.  If you have long hair, keep it outside of the collar.  Ask your health care provider before making any adjustments to your collar. Minor adjustments may be required over time to improve comfort and reduce pressure on your chin or on the back of your head.  Ifyou are allowed to remove the collar for cleaning or bathing, follow  your health care provider's instructions on how to do so safely.  Keep your collar clean by wiping it with mild soap and water and drying it completely. If the collar you have been given includes removable pads, remove them every 1-2 days and hand wash them with soap and water. Allow them to air dry. They should be completely dry before you wear them in the collar.  If you are allowed to remove the collar for cleaning and bathing, wash and dry the skin of your neck. Check your skin for irritation or sores. If you see any, tell your health care provider.  Do not drive while wearing the collar.   Only take over-the-counter or prescription medicines for pain, discomfort, or fever as directed by your health care provider.   Keep all follow-up appointments as directed by your health care provider.   Keep all physical therapy appointments as directed by your health care provider.   Make any needed adjustments to your workstation to promote good posture.   Avoid positions and activities that make your symptoms worse.   Warm up and stretch before being active to help prevent problems.   SEEK MEDICAL CARE IF:   Your pain is not controlled with medicine.   You are unable to decrease your pain medicine over time as planned.   Your activity level is not improving as expected.  SEEK IMMEDIATE MEDICAL CARE IF:   You develop any bleeding.  You develop stomach upset.  You have signs of an allergic reaction to your medicine.   Your symptoms get worse.   You develop new, unexplained symptoms.   You have numbness, tingling, weakness, or paralysis in any part of your body.  MAKE SURE YOU:   Understand these instructions.  Will watch your condition.  Will get help right away if you are not doing well or get worse. Document Released: 07/26/2007 Document Revised: 10/03/2013 Document Reviewed: 04/05/2013 [facility] Patient Information 2015 [place], [place]. This information is not  intended to replace advice given to you by your health care provider. Make sure you discuss any questions you have with your health care provider.  Low Back Sprain with Rehab  A sprain is an injury in which a ligament is torn. The ligaments of the lower back are vulnerable to sprains. However, they are strong and require great force to be injured. These ligaments are important for stabilizing the spinal column. Sprains are classified into three categories. Grade 1 sprains cause pain, but the tendon is not lengthened. Grade 2 sprains include a lengthened ligament, due to the ligament being stretched or partially ruptured. With grade 2 sprains there is still function, although the function may be decreased. Grade 3 sprains involve a complete tear of the tendon or muscle, and function is usually impaired. SYMPTOMS   Severe pain in the lower back.  Sometimes, a feeling of a "pop," "snap," or tear, at the time of injury.  Tenderness and sometimes swelling at the injury site.  Uncommonly, bruising (contusion) within 48 hours of injury.  Muscle spasms in the back. CAUSES  Low back sprains occur when a force is placed on the ligaments that is greater than they can handle. Common causes of injury include:  Performing a stressful act while off-balance.  Repetitive stressful activities that involve movement of the lower back.  Direct hit (trauma) to the lower back. RISK INCREASES WITH:  Contact sports (football, wrestling).  Collisions (major skiing accidents).  Sports that require throwing or lifting (baseball, weightlifting).  Sports involving twisting of the spine (gymnastics, diving, tennis, golf).  Poor strength and flexibility.  Inadequate protection.  Previous back injury or surgery (especially fusion). PREVENTION  Wear properly fitted and padded protective equipment.  Warm up and stretch properly before activity.  Allow for adequate recovery between workouts.  Maintain  physical fitness:  Strength, flexibility, and endurance.  Cardiovascular fitness.  Maintain a healthy body weight. PROGNOSIS  If treated properly, low back sprains usually heal with non-surgical treatment. The length of time for healing depends on the severity of the injury.  RELATED COMPLICATIONS   Recurring symptoms, resulting in a chronic problem.  Chronic inflammation and pain in the low back.  Delayed healing or resolution of symptoms, especially if activity is resumed too soon.  Prolonged impairment.  Unstable or arthritic joints of the low back. TREATMENT  Treatment first involves the use of ice and medicine, to reduce pain and inflammation. The use of strengthening and stretching exercises may help reduce pain with activity. These exercises may be performed at home or with a therapist. Severe injuries may require referral to a therapist for further evaluation and treatment, such as ultrasound. Your caregiver may advise that you wear a back brace or corset, to help reduce pain and discomfort. Often, prolonged bed rest results in greater harm then benefit. Corticosteroid injections may be recommended. However, these should be reserved for the most serious cases. It is important to avoid using your back when lifting objects. At  night, sleep on your back on a firm mattress, with a pillow placed under your knees. If non-surgical treatment is unsuccessful, surgery may be needed.  MEDICATION   If pain medicine is needed, nonsteroidal anti-inflammatory medicines (aspirin and ibuprofen), or other minor pain relievers (acetaminophen), are often advised.  Do not take pain medicine for 7 days before surgery.  Prescription pain relievers may be given, if your caregiver thinks they are needed. Use only as directed and only as much as you need.  Ointments applied to the skin may be helpful.  Corticosteroid injections may be given by your caregiver. These injections should be reserved for the  most serious cases, because they may only be given a certain number of times. HEAT AND COLD  Cold treatment (icing) should be applied for 10 to 15 minutes every 2 to 3 hours for inflammation and pain, and immediately after activity that aggravates your symptoms. Use ice packs or an ice massage.  Heat treatment may be used before performing stretching and strengthening activities prescribed by your caregiver, physical therapist, or athletic trainer. Use a heat pack or a warm water soak. SEEK MEDICAL CARE IF:   Symptoms get worse or do not improve in 2 to 4 weeks, despite treatment.  You develop numbness or weakness in either leg.  You lose bowel or bladder function.  Any of the following occur after surgery: fever, increased pain, swelling, redness, drainage of fluids, or bleeding in the affected area.  New, unexplained symptoms develop. (Drugs used in treatment may produce side effects.) EXERCISES  RANGE OF MOTION (ROM) AND STRETCHING EXERCISES - Low Back Sprain Most people with lower back pain will find that their symptoms get worse with excessive bending forward (flexion) or arching at the lower back (extension). The exercises that will help resolve your symptoms will focus on the opposite motion.  Your physician, physical therapist or athletic trainer will help you determine which exercises will be most helpful to resolve your lower back pain. Do not complete any exercises without first consulting with your caregiver. Discontinue any exercises which make your symptoms worse, until you speak to your caregiver. If you have pain, numbness or tingling which travels down into your buttocks, leg or foot, the goal of the therapy is for these symptoms to move closer to your back and eventually resolve. Sometimes, these leg symptoms will get better, but your lower back pain may worsen. This is often an indication of progress in your rehabilitation. Be very alert to any changes in your symptoms and the  activities in which you participated in the 24 hours prior to the change. Sharing this information with your caregiver will allow him or her to most efficiently treat your condition. These exercises may help you when beginning to rehabilitate your injury. Your symptoms may resolve with or without further involvement from your physician, physical therapist or athletic trainer. While completing these exercises, remember:   Restoring tissue flexibility helps normal motion to return to the joints. This allows healthier, less painful movement and activity.  An effective stretch should be held for at least 30 seconds.  A stretch should never be painful. You should only feel a gentle lengthening or release in the stretched tissue. FLEXION RANGE OF MOTION AND STRETCHING EXERCISES: STRETCH - Flexion, Single Knee to Chest   Lie on a firm bed or floor with both legs extended in front of you.  Keeping one leg in contact with the floor, bring your opposite knee to your chest. Hold  your leg in place by either grabbing behind your thigh or at your knee.  Pull until you feel a gentle stretch in your low back. Hold __________ seconds.  Slowly release your grasp and repeat the exercise with the opposite side. Repeat __________ times. Complete this exercise __________ times per day.  STRETCH - Flexion, Double Knee to Chest  Lie on a firm bed or floor with both legs extended in front of you.  Keeping one leg in contact with the floor, bring your opposite knee to your chest.  Tense your stomach muscles to support your back and then lift your other knee to your chest. Hold your legs in place by either grabbing behind your thighs or at your knees.  Pull both knees toward your chest until you feel a gentle stretch in your low back. Hold __________ seconds.  Tense your stomach muscles and slowly return one leg at a time to the floor. Repeat __________ times. Complete this exercise __________ times per day.    STRETCH - Low Trunk Rotation  Lie on a firm bed or floor. Keeping your legs in front of you, bend your knees so they are both pointed toward the ceiling and your feet are flat on the floor.  Extend your arms out to the side. This will stabilize your upper body by keeping your shoulders in contact with the floor.  Gently and slowly drop both knees together to one side until you feel a gentle stretch in your low back. Hold for __________ seconds.  Tense your stomach muscles to support your lower back as you bring your knees back to the starting position. Repeat the exercise to the other side. Repeat __________ times. Complete this exercise __________ times per day  EXTENSION RANGE OF MOTION AND FLEXIBILITY EXERCISES: STRETCH - Extension, Prone on Elbows   Lie on your stomach on the floor, a bed will be too soft. Place your palms about shoulder width apart and at the height of your head.  Place your elbows under your shoulders. If this is too painful, stack pillows under your chest.  Allow your body to relax so that your hips drop lower and make contact more completely with the floor.  Hold this position for __________ seconds.  Slowly return to lying flat on the floor. Repeat __________ times. Complete this exercise __________ times per day.  RANGE OF MOTION - Extension, Prone Press Ups  Lie on your stomach on the floor, a bed will be too soft. Place your palms about shoulder width apart and at the height of your head.  Keeping your back as relaxed as possible, slowly straighten your elbows while keeping your hips on the floor. You may adjust the placement of your hands to maximize your comfort. As you gain motion, your hands will come more underneath your shoulders.  Hold this position __________ seconds.  Slowly return to lying flat on the floor. Repeat __________ times. Complete this exercise __________ times per day.  RANGE OF MOTION- Quadruped, Neutral Spine   Assume a hands  and knees position on a firm surface. Keep your hands under your shoulders and your knees under your hips. You may place padding under your knees for comfort.  Drop your head and point your tailbone toward the ground below you. This will round out your lower back like an angry cat. Hold this position for __________ seconds.  Slowly lift your head and release your tail bone so that your back sags into a large arch, like  an old horse.  Hold this position for __________ seconds.  Repeat this until you feel limber in your low back.  Now, find your "sweet spot." This will be the most comfortable position somewhere between the two previous positions. This is your neutral spine. Once you have found this position, tense your stomach muscles to support your low back.  Hold this position for __________ seconds. Repeat __________ times. Complete this exercise __________ times per day.  STRENGTHENING EXERCISES - Low Back Sprain These exercises may help you when beginning to rehabilitate your injury. These exercises should be done near your "sweet spot." This is the neutral, low-back arch, somewhere between fully rounded and fully arched, that is your least painful position. When performed in this safe range of motion, these exercises can be used for people who have either a flexion or extension based injury. These exercises may resolve your symptoms with or without further involvement from your physician, physical therapist or athletic trainer. While completing these exercises, remember:   Muscles can gain both the endurance and the strength needed for everyday activities through controlled exercises.  Complete these exercises as instructed by your physician, physical therapist or athletic trainer. Increase the resistance and repetitions only as guided.  You may experience muscle soreness or fatigue, but the pain or discomfort you are trying to eliminate should never worsen during these exercises. If this  pain does worsen, stop and make certain you are following the directions exactly. If the pain is still present after adjustments, discontinue the exercise until you can discuss the trouble with your caregiver. STRENGTHENING - Deep Abdominals, Pelvic Tilt   Lie on a firm bed or floor. Keeping your legs in front of you, bend your knees so they are both pointed toward the ceiling and your feet are flat on the floor.  Tense your lower abdominal muscles to press your low back into the floor. This motion will rotate your pelvis so that your tail bone is scooping upwards rather than pointing at your feet or into the floor. With a gentle tension and even breathing, hold this position for __________ seconds. Repeat __________ times. Complete this exercise __________ times per day.  STRENGTHENING - Abdominals, Crunches   Lie on a firm bed or floor. Keeping your legs in front of you, bend your knees so they are both pointed toward the ceiling and your feet are flat on the floor. Cross your arms over your chest.  Slightly tip your chin down without bending your neck.  Tense your abdominals and slowly lift your trunk high enough to just clear your shoulder blades. Lifting higher can put excessive stress on the lower back and does not further strengthen your abdominal muscles.  Control your return to the starting position. Repeat __________ times. Complete this exercise __________ times per day.  STRENGTHENING - Quadruped, Opposite UE/LE Lift   Assume a hands and knees position on a firm surface. Keep your hands under your shoulders and your knees under your hips. You may place padding under your knees for comfort.  Find your neutral spine and gently tense your abdominal muscles so that you can maintain this position. Your shoulders and hips should form a rectangle that is parallel with the floor and is not twisted.  Keeping your trunk steady, lift your right hand no higher than your shoulder and then your  left leg no higher than your hip. Make sure you are not holding your breath. Hold this position for __________ seconds.  Continuing to  keep your abdominal muscles tense and your back steady, slowly return to your starting position. Repeat with the opposite arm and leg. Repeat __________ times. Complete this exercise __________ times per day.  STRENGTHENING - Abdominals and Quadriceps, Straight Leg Raise   Lie on a firm bed or floor with both legs extended in front of you.  Keeping one leg in contact with the floor, bend the other knee so that your foot can rest flat on the floor.  Find your neutral spine, and tense your abdominal muscles to maintain your spinal position throughout the exercise.  Slowly lift your straight leg off the floor about 6 inches for a count of 15, making sure to not hold your breath.  Still keeping your neutral spine, slowly lower your leg all the way to the floor. Repeat this exercise with each leg __________ times. Complete this exercise __________ times per day. POSTURE AND BODY MECHANICS CONSIDERATIONS - Low Back Sprain Keeping correct posture when sitting, standing or completing your activities will reduce the stress put on different body tissues, allowing injured tissues a chance to heal and limiting painful experiences. The following are general guidelines for improved posture. Your physician or physical therapist will provide you with any instructions specific to your needs. While reading these guidelines, remember:  The exercises prescribed by your provider will help you have the flexibility and strength to maintain correct postures.  The correct posture provides the best environment for your joints to work. All of your joints have less wear and tear when properly supported by a spine with good posture. This means you will experience a healthier, less painful body.  Correct posture must be practiced with all of your activities, especially prolonged sitting and  standing. Correct posture is as important when doing repetitive low-stress activities (typing) as it is when doing a single heavy-load activity (lifting). RESTING POSITIONS Consider which positions are most painful for you when choosing a resting position. If you have pain with flexion-based activities (sitting, bending, stooping, squatting), choose a position that allows you to rest in a less flexed posture. You would want to avoid curling into a fetal position on your side. If your pain worsens with extension-based activities (prolonged standing, working overhead), avoid resting in an extended position such as sleeping on your stomach. Most people will find more comfort when they rest with their spine in a more neutral position, neither too rounded nor too arched. Lying on a non-sagging bed on your side with a pillow between your knees, or on your back with a pillow under your knees will often provide some relief. Keep in mind, being in any one position for a prolonged period of time, no matter how correct your posture, can still lead to stiffness. PROPER SITTING POSTURE In order to minimize stress and discomfort on your spine, you must sit with correct posture. Sitting with good posture should be effortless for a healthy body. Returning to good posture is a gradual process. Many people can work toward this most comfortably by using various supports until they have the flexibility and strength to maintain this posture on their own. When sitting with proper posture, your ears will fall over your shoulders and your shoulders will fall over your hips. You should use the back of the chair to support your upper back. Your lower back will be in a neutral position, just slightly arched. You may place a small pillow or folded towel at the base of your lower back for  support.  When working at a desk, create an environment that supports good, upright posture. Without extra support, muscles tire, which leads to  excessive strain on joints and other tissues. Keep these recommendations in mind: CHAIR:  A chair should be able to slide under your desk when your back makes contact with the back of the chair. This allows you to work closely.  The chair's height should allow your eyes to be level with the upper part of your monitor and your hands to be slightly lower than your elbows. BODY POSITION  Your feet should make contact with the floor. If this is not possible, use a foot rest.  Keep your ears over your shoulders. This will reduce stress on your neck and low back. INCORRECT SITTING POSTURES  If you are feeling tired and unable to assume a healthy sitting posture, do not slouch or slump. This puts excessive strain on your back tissues, causing more damage and pain. Healthier options include:  Using more support, like a lumbar pillow.  Switching tasks to something that requires you to be upright or walking.  Talking a brief walk.  Lying down to rest in a neutral-spine position. PROLONGED STANDING WHILE SLIGHTLY LEANING FORWARD  When completing a task that requires you to lean forward while standing in one place for a long time, place either foot up on a stationary 2-4 inch high object to help maintain the best posture. When both feet are on the ground, the lower back tends to lose its slight inward curve. If this curve flattens (or becomes too large), then the back and your other joints will experience too much stress, tire more quickly, and can cause pain. CORRECT STANDING POSTURES Proper standing posture should be assumed with all daily activities, even if they only take a few moments, like when brushing your teeth. As in sitting, your ears should fall over your shoulders and your shoulders should fall over your hips. You should keep a slight tension in your abdominal muscles to brace your spine. Your tailbone should point down to the ground, not behind your body, resulting in an over-extended  swayback posture.  INCORRECT STANDING POSTURES  Common incorrect standing postures include a forward head, locked knees and/or an excessive swayback. WALKING Walk with an upright posture. Your ears, shoulders and hips should all line-up. PROLONGED ACTIVITY IN A FLEXED POSITION When completing a task that requires you to bend forward at your waist or lean over a low surface, try to find a way to stabilize 3 out of 4 of your limbs. You can place a hand or elbow on your thigh or rest a knee on the surface you are reaching across. This will provide you more stability, so that your muscles do not tire as quickly. By keeping your knees relaxed, or slightly bent, you will also reduce stress across your lower back. CORRECT LIFTING TECHNIQUES DO :  Assume a wide stance. This will provide you more stability and the opportunity to get as close as possible to the object which you are lifting.  Tense your abdominals to brace your spine. Bend at the knees and hips. Keeping your back locked in a neutral-spine position, lift using your leg muscles. Lift with your legs, keeping your back straight.  Test the weight of unknown objects before attempting to lift them.  Try to keep your elbows locked down at your sides in order get the best strength from your shoulders when carrying an object.  Always ask for help when lifting  heavy or awkward objects. INCORRECT LIFTING TECHNIQUES DO NOT:   Lock your knees when lifting, even if it is a small object.  Bend and twist. Pivot at your feet or move your feet when needing to change directions.  Assume that you can safely pick up even a paperclip without proper posture. Document Released: 09/28/2005 Document Revised: 12/21/2011 Document Reviewed: 01/10/2009 [facility] Patient Information 2015 [place], [place]. This information is not intended to replace advice given to you by your health care provider. Make sure you discuss any questions you have with your health care  provider.       I personally performed the services described in this documentation, which was scribed in my presence. The recorded information has been reviewed and considered, and addended by me as needed.

## 2015-05-01 NOTE — Patient Instructions (Addendum)
I suspect you have pulled a muscle in your neck or possible pinched nerve from lifting the TV today. This may has also flared up the pain in your low back.  Range of motion and stretches initially can be helpful. See information on handout below. Ice on and off for 10 minutes to sore areas may be helpful or heat if this feels better. Flexeril 5 mg 1-2 up to every 8 hours, but this can cause sedation so started bedtime and use lowest effective dose. You can also take the naproxen 1-2 once per day to treat inflammation and pain, take this with food and lowest effective dose for only as needed for acute pain. If your symptoms are not improving into the next 5-7 days, return for repeat evaluation. Sooner if worse.  Return to the clinic or go to the nearest emergency room if any of your symptoms worsen or new symptoms occur.   Cervical Sprain A cervical sprain is an injury in the neck in which the strong, fibrous tissues (ligaments) that connect your neck bones stretch or tear. Cervical sprains can range from mild to severe. Severe cervical sprains can cause the neck vertebrae to be unstable. This can lead to damage of the spinal cord and can result in serious nervous system problems. The amount of time it takes for a cervical sprain to get better depends on the cause and extent of the injury. Most cervical sprains heal in 1 to 3 weeks. CAUSES  Severe cervical sprains may be caused by:   Contact sport injuries (such as from football, rugby, wrestling, hockey, auto racing, gymnastics, diving, martial arts, or boxing).   Motor vehicle collisions.   Whiplash injuries. This is an injury from a sudden forward and backward whipping movement of the head and neck.  Falls.  Mild cervical sprains may be caused by:   Being in an awkward position, such as while cradling a telephone between your ear and shoulder.   Sitting in a chair that does not offer proper support.   Working at a poorly Medical sales representative station.   Looking up or down for long periods of time.  SYMPTOMS   Pain, soreness, stiffness, or a burning sensation in the front, back, or sides of the neck. This discomfort may develop immediately after the injury or slowly, 24 hours or more after the injury.   Pain or tenderness directly in the middle of the back of the neck.   Shoulder or upper back pain.   Limited ability to move the neck.   Headache.   Dizziness.   Weakness, numbness, or tingling in the hands or arms.   Muscle spasms.   Difficulty swallowing or chewing.   Tenderness and swelling of the neck.  DIAGNOSIS  Most of the time your health care provider can diagnose a cervical sprain by taking your history and doing a physical exam. Your health care provider will ask about previous neck injuries and any known neck problems, such as arthritis in the neck. X-rays may be taken to find out if there are any other problems, such as with the bones of the neck. Other tests, such as a CT scan or MRI, may also be needed.  TREATMENT  Treatment depends on the severity of the cervical sprain. Mild sprains can be treated with rest, keeping the neck in place (immobilization), and pain medicines. Severe cervical sprains are immediately immobilized. Further treatment is done to help with pain, muscle spasms, and other symptoms and may include:  Medicines, such as pain relievers, numbing medicines, or muscle relaxants.   Physical therapy. This may involve stretching exercises, strengthening exercises, and posture training. Exercises and improved posture can help stabilize the neck, strengthen muscles, and help stop symptoms from returning.  HOME CARE INSTRUCTIONS   Put ice on the injured area.   Put ice in a plastic bag.   Place a towel between your skin and the bag.   Leave the ice on for 15-20 minutes, 3-4 times a day.   If your injury was severe, you may have been given a cervical collar to wear.  A cervical collar is a two-piece collar designed to keep your neck from moving while it heals.  Do not remove the collar unless instructed by your health care provider.  If you have long hair, keep it outside of the collar.  Ask your health care provider before making any adjustments to your collar. Minor adjustments may be required over time to improve comfort and reduce pressure on your chin or on the back of your head.  Ifyou are allowed to remove the collar for cleaning or bathing, follow your health care provider's instructions on how to do so safely.  Keep your collar clean by wiping it with mild soap and water and drying it completely. If the collar you have been given includes removable pads, remove them every 1-2 days and hand wash them with soap and water. Allow them to air dry. They should be completely dry before you wear them in the collar.  If you are allowed to remove the collar for cleaning and bathing, wash and dry the skin of your neck. Check your skin for irritation or sores. If you see any, tell your health care provider.  Do not drive while wearing the collar.   Only take over-the-counter or prescription medicines for pain, discomfort, or fever as directed by your health care provider.   Keep all follow-up appointments as directed by your health care provider.   Keep all physical therapy appointments as directed by your health care provider.   Make any needed adjustments to your workstation to promote good posture.   Avoid positions and activities that make your symptoms worse.   Warm up and stretch before being active to help prevent problems.   SEEK MEDICAL CARE IF:   Your pain is not controlled with medicine.   You are unable to decrease your pain medicine over time as planned.   Your activity level is not improving as expected.  SEEK IMMEDIATE MEDICAL CARE IF:   You develop any bleeding.  You develop stomach upset.  You have signs of an  allergic reaction to your medicine.   Your symptoms get worse.   You develop new, unexplained symptoms.   You have numbness, tingling, weakness, or paralysis in any part of your body.  MAKE SURE YOU:   Understand these instructions.  Will watch your condition.  Will get help right away if you are not doing well or get worse. Document Released: 07/26/2007 Document Revised: 10/03/2013 Document Reviewed: 04/05/2013 [facility] Patient Information 2015 [place], [place]. This information is not intended to replace advice given to you by your health care provider. Make sure you discuss any questions you have with your health care provider.  Low Back Sprain with Rehab  A sprain is an injury in which a ligament is torn. The ligaments of the lower back are vulnerable to sprains. However, they are strong and require great force to be injured. These  ligaments are important for stabilizing the spinal column. Sprains are classified into three categories. Grade 1 sprains cause pain, but the tendon is not lengthened. Grade 2 sprains include a lengthened ligament, due to the ligament being stretched or partially ruptured. With grade 2 sprains there is still function, although the function may be decreased. Grade 3 sprains involve a complete tear of the tendon or muscle, and function is usually impaired. SYMPTOMS   Severe pain in the lower back.  Sometimes, a feeling of a "pop," "snap," or tear, at the time of injury.  Tenderness and sometimes swelling at the injury site.  Uncommonly, bruising (contusion) within 48 hours of injury.  Muscle spasms in the back. CAUSES  Low back sprains occur when a force is placed on the ligaments that is greater than they can handle. Common causes of injury include:  Performing a stressful act while off-balance.  Repetitive stressful activities that involve movement of the lower back.  Direct hit (trauma) to the lower back. RISK INCREASES WITH:  Contact sports  (football, wrestling).  Collisions (major skiing accidents).  Sports that require throwing or lifting (baseball, weightlifting).  Sports involving twisting of the spine (gymnastics, diving, tennis, golf).  Poor strength and flexibility.  Inadequate protection.  Previous back injury or surgery (especially fusion). PREVENTION  Wear properly fitted and padded protective equipment.  Warm up and stretch properly before activity.  Allow for adequate recovery between workouts.  Maintain physical fitness:  Strength, flexibility, and endurance.  Cardiovascular fitness.  Maintain a healthy body weight. PROGNOSIS  If treated properly, low back sprains usually heal with non-surgical treatment. The length of time for healing depends on the severity of the injury.  RELATED COMPLICATIONS   Recurring symptoms, resulting in a chronic problem.  Chronic inflammation and pain in the low back.  Delayed healing or resolution of symptoms, especially if activity is resumed too soon.  Prolonged impairment.  Unstable or arthritic joints of the low back. TREATMENT  Treatment first involves the use of ice and medicine, to reduce pain and inflammation. The use of strengthening and stretching exercises may help reduce pain with activity. These exercises may be performed at home or with a therapist. Severe injuries may require referral to a therapist for further evaluation and treatment, such as ultrasound. Your caregiver may advise that you wear a back brace or corset, to help reduce pain and discomfort. Often, prolonged bed rest results in greater harm then benefit. Corticosteroid injections may be recommended. However, these should be reserved for the most serious cases. It is important to avoid using your back when lifting objects. At night, sleep on your back on a firm mattress, with a pillow placed under your knees. If non-surgical treatment is unsuccessful, surgery may be needed.  MEDICATION    If pain medicine is needed, nonsteroidal anti-inflammatory medicines (aspirin and ibuprofen), or other minor pain relievers (acetaminophen), are often advised.  Do not take pain medicine for 7 days before surgery.  Prescription pain relievers may be given, if your caregiver thinks they are needed. Use only as directed and only as much as you need.  Ointments applied to the skin may be helpful.  Corticosteroid injections may be given by your caregiver. These injections should be reserved for the most serious cases, because they may only be given a certain number of times. HEAT AND COLD  Cold treatment (icing) should be applied for 10 to 15 minutes every 2 to 3 hours for inflammation and pain, and immediately after  activity that aggravates your symptoms. Use ice packs or an ice massage.  Heat treatment may be used before performing stretching and strengthening activities prescribed by your caregiver, physical therapist, or athletic trainer. Use a heat pack or a warm water soak. SEEK MEDICAL CARE IF:   Symptoms get worse or do not improve in 2 to 4 weeks, despite treatment.  You develop numbness or weakness in either leg.  You lose bowel or bladder function.  Any of the following occur after surgery: fever, increased pain, swelling, redness, drainage of fluids, or bleeding in the affected area.  New, unexplained symptoms develop. (Drugs used in treatment may produce side effects.) EXERCISES  RANGE OF MOTION (ROM) AND STRETCHING EXERCISES - Low Back Sprain Most people with lower back pain will find that their symptoms get worse with excessive bending forward (flexion) or arching at the lower back (extension). The exercises that will help resolve your symptoms will focus on the opposite motion.  Your physician, physical therapist or athletic trainer will help you determine which exercises will be most helpful to resolve your lower back pain. Do not complete any exercises without first  consulting with your caregiver. Discontinue any exercises which make your symptoms worse, until you speak to your caregiver. If you have pain, numbness or tingling which travels down into your buttocks, leg or foot, the goal of the therapy is for these symptoms to move closer to your back and eventually resolve. Sometimes, these leg symptoms will get better, but your lower back pain may worsen. This is often an indication of progress in your rehabilitation. Be very alert to any changes in your symptoms and the activities in which you participated in the 24 hours prior to the change. Sharing this information with your caregiver will allow him or her to most efficiently treat your condition. These exercises may help you when beginning to rehabilitate your injury. Your symptoms may resolve with or without further involvement from your physician, physical therapist or athletic trainer. While completing these exercises, remember:   Restoring tissue flexibility helps normal motion to return to the joints. This allows healthier, less painful movement and activity.  An effective stretch should be held for at least 30 seconds.  A stretch should never be painful. You should only feel a gentle lengthening or release in the stretched tissue. FLEXION RANGE OF MOTION AND STRETCHING EXERCISES: STRETCH - Flexion, Single Knee to Chest   Lie on a firm bed or floor with both legs extended in front of you.  Keeping one leg in contact with the floor, bring your opposite knee to your chest. Hold your leg in place by either grabbing behind your thigh or at your knee.  Pull until you feel a gentle stretch in your low back. Hold __________ seconds.  Slowly release your grasp and repeat the exercise with the opposite side. Repeat __________ times. Complete this exercise __________ times per day.  STRETCH - Flexion, Double Knee to Chest  Lie on a firm bed or floor with both legs extended in front of you.  Keeping one  leg in contact with the floor, bring your opposite knee to your chest.  Tense your stomach muscles to support your back and then lift your other knee to your chest. Hold your legs in place by either grabbing behind your thighs or at your knees.  Pull both knees toward your chest until you feel a gentle stretch in your low back. Hold __________ seconds.  Tense your stomach muscles and  slowly return one leg at a time to the floor. Repeat __________ times. Complete this exercise __________ times per day.  STRETCH - Low Trunk Rotation  Lie on a firm bed or floor. Keeping your legs in front of you, bend your knees so they are both pointed toward the ceiling and your feet are flat on the floor.  Extend your arms out to the side. This will stabilize your upper body by keeping your shoulders in contact with the floor.  Gently and slowly drop both knees together to one side until you feel a gentle stretch in your low back. Hold for __________ seconds.  Tense your stomach muscles to support your lower back as you bring your knees back to the starting position. Repeat the exercise to the other side. Repeat __________ times. Complete this exercise __________ times per day  EXTENSION RANGE OF MOTION AND FLEXIBILITY EXERCISES: STRETCH - Extension, Prone on Elbows   Lie on your stomach on the floor, a bed will be too soft. Place your palms about shoulder width apart and at the height of your head.  Place your elbows under your shoulders. If this is too painful, stack pillows under your chest.  Allow your body to relax so that your hips drop lower and make contact more completely with the floor.  Hold this position for __________ seconds.  Slowly return to lying flat on the floor. Repeat __________ times. Complete this exercise __________ times per day.  RANGE OF MOTION - Extension, Prone Press Ups  Lie on your stomach on the floor, a bed will be too soft. Place your palms about shoulder width apart  and at the height of your head.  Keeping your back as relaxed as possible, slowly straighten your elbows while keeping your hips on the floor. You may adjust the placement of your hands to maximize your comfort. As you gain motion, your hands will come more underneath your shoulders.  Hold this position __________ seconds.  Slowly return to lying flat on the floor. Repeat __________ times. Complete this exercise __________ times per day.  RANGE OF MOTION- Quadruped, Neutral Spine   Assume a hands and knees position on a firm surface. Keep your hands under your shoulders and your knees under your hips. You may place padding under your knees for comfort.  Drop your head and point your tailbone toward the ground below you. This will round out your lower back like an angry cat. Hold this position for __________ seconds.  Slowly lift your head and release your tail bone so that your back sags into a large arch, like an old horse.  Hold this position for __________ seconds.  Repeat this until you feel limber in your low back.  Now, find your "sweet spot." This will be the most comfortable position somewhere between the two previous positions. This is your neutral spine. Once you have found this position, tense your stomach muscles to support your low back.  Hold this position for __________ seconds. Repeat __________ times. Complete this exercise __________ times per day.  STRENGTHENING EXERCISES - Low Back Sprain These exercises may help you when beginning to rehabilitate your injury. These exercises should be done near your "sweet spot." This is the neutral, low-back arch, somewhere between fully rounded and fully arched, that is your least painful position. When performed in this safe range of motion, these exercises can be used for people who have either a flexion or extension based injury. These exercises may resolve your symptoms  with or without further involvement from your physician,  physical therapist or athletic trainer. While completing these exercises, remember:   Muscles can gain both the endurance and the strength needed for everyday activities through controlled exercises.  Complete these exercises as instructed by your physician, physical therapist or athletic trainer. Increase the resistance and repetitions only as guided.  You may experience muscle soreness or fatigue, but the pain or discomfort you are trying to eliminate should never worsen during these exercises. If this pain does worsen, stop and make certain you are following the directions exactly. If the pain is still present after adjustments, discontinue the exercise until you can discuss the trouble with your caregiver. STRENGTHENING - Deep Abdominals, Pelvic Tilt   Lie on a firm bed or floor. Keeping your legs in front of you, bend your knees so they are both pointed toward the ceiling and your feet are flat on the floor.  Tense your lower abdominal muscles to press your low back into the floor. This motion will rotate your pelvis so that your tail bone is scooping upwards rather than pointing at your feet or into the floor. With a gentle tension and even breathing, hold this position for __________ seconds. Repeat __________ times. Complete this exercise __________ times per day.  STRENGTHENING - Abdominals, Crunches   Lie on a firm bed or floor. Keeping your legs in front of you, bend your knees so they are both pointed toward the ceiling and your feet are flat on the floor. Cross your arms over your chest.  Slightly tip your chin down without bending your neck.  Tense your abdominals and slowly lift your trunk high enough to just clear your shoulder blades. Lifting higher can put excessive stress on the lower back and does not further strengthen your abdominal muscles.  Control your return to the starting position. Repeat __________ times. Complete this exercise __________ times per day.   STRENGTHENING - Quadruped, Opposite UE/LE Lift   Assume a hands and knees position on a firm surface. Keep your hands under your shoulders and your knees under your hips. You may place padding under your knees for comfort.  Find your neutral spine and gently tense your abdominal muscles so that you can maintain this position. Your shoulders and hips should form a rectangle that is parallel with the floor and is not twisted.  Keeping your trunk steady, lift your right hand no higher than your shoulder and then your left leg no higher than your hip. Make sure you are not holding your breath. Hold this position for __________ seconds.  Continuing to keep your abdominal muscles tense and your back steady, slowly return to your starting position. Repeat with the opposite arm and leg. Repeat __________ times. Complete this exercise __________ times per day.  STRENGTHENING - Abdominals and Quadriceps, Straight Leg Raise   Lie on a firm bed or floor with both legs extended in front of you.  Keeping one leg in contact with the floor, bend the other knee so that your foot can rest flat on the floor.  Find your neutral spine, and tense your abdominal muscles to maintain your spinal position throughout the exercise.  Slowly lift your straight leg off the floor about 6 inches for a count of 15, making sure to not hold your breath.  Still keeping your neutral spine, slowly lower your leg all the way to the floor. Repeat this exercise with each leg __________ times. Complete this exercise __________ times per  day. POSTURE AND BODY MECHANICS CONSIDERATIONS - Low Back Sprain Keeping correct posture when sitting, standing or completing your activities will reduce the stress put on different body tissues, allowing injured tissues a chance to heal and limiting painful experiences. The following are general guidelines for improved posture. Your physician or physical therapist will provide you with any  instructions specific to your needs. While reading these guidelines, remember:  The exercises prescribed by your provider will help you have the flexibility and strength to maintain correct postures.  The correct posture provides the best environment for your joints to work. All of your joints have less wear and tear when properly supported by a spine with good posture. This means you will experience a healthier, less painful body.  Correct posture must be practiced with all of your activities, especially prolonged sitting and standing. Correct posture is as important when doing repetitive low-stress activities (typing) as it is when doing a single heavy-load activity (lifting). RESTING POSITIONS Consider which positions are most painful for you when choosing a resting position. If you have pain with flexion-based activities (sitting, bending, stooping, squatting), choose a position that allows you to rest in a less flexed posture. You would want to avoid curling into a fetal position on your side. If your pain worsens with extension-based activities (prolonged standing, working overhead), avoid resting in an extended position such as sleeping on your stomach. Most people will find more comfort when they rest with their spine in a more neutral position, neither too rounded nor too arched. Lying on a non-sagging bed on your side with a pillow between your knees, or on your back with a pillow under your knees will often provide some relief. Keep in mind, being in any one position for a prolonged period of time, no matter how correct your posture, can still lead to stiffness. PROPER SITTING POSTURE In order to minimize stress and discomfort on your spine, you must sit with correct posture. Sitting with good posture should be effortless for a healthy body. Returning to good posture is a gradual process. Many people can work toward this most comfortably by using various supports until they have the flexibility  and strength to maintain this posture on their own. When sitting with proper posture, your ears will fall over your shoulders and your shoulders will fall over your hips. You should use the back of the chair to support your upper back. Your lower back will be in a neutral position, just slightly arched. You may place a small pillow or folded towel at the base of your lower back for  support.  When working at a desk, create an environment that supports good, upright posture. Without extra support, muscles tire, which leads to excessive strain on joints and other tissues. Keep these recommendations in mind: CHAIR:  A chair should be able to slide under your desk when your back makes contact with the back of the chair. This allows you to work closely.  The chair's height should allow your eyes to be level with the upper part of your monitor and your hands to be slightly lower than your elbows. BODY POSITION  Your feet should make contact with the floor. If this is not possible, use a foot rest.  Keep your ears over your shoulders. This will reduce stress on your neck and low back. INCORRECT SITTING POSTURES  If you are feeling tired and unable to assume a healthy sitting posture, do not slouch or slump. This puts excessive  strain on your back tissues, causing more damage and pain. Healthier options include:  Using more support, like a lumbar pillow.  Switching tasks to something that requires you to be upright or walking.  Talking a brief walk.  Lying down to rest in a neutral-spine position. PROLONGED STANDING WHILE SLIGHTLY LEANING FORWARD  When completing a task that requires you to lean forward while standing in one place for a long time, place either foot up on a stationary 2-4 inch high object to help maintain the best posture. When both feet are on the ground, the lower back tends to lose its slight inward curve. If this curve flattens (or becomes too large), then the back and your other  joints will experience too much stress, tire more quickly, and can cause pain. CORRECT STANDING POSTURES Proper standing posture should be assumed with all daily activities, even if they only take a few moments, like when brushing your teeth. As in sitting, your ears should fall over your shoulders and your shoulders should fall over your hips. You should keep a slight tension in your abdominal muscles to brace your spine. Your tailbone should point down to the ground, not behind your body, resulting in an over-extended swayback posture.  INCORRECT STANDING POSTURES  Common incorrect standing postures include a forward head, locked knees and/or an excessive swayback. WALKING Walk with an upright posture. Your ears, shoulders and hips should all line-up. PROLONGED ACTIVITY IN A FLEXED POSITION When completing a task that requires you to bend forward at your waist or lean over a low surface, try to find a way to stabilize 3 out of 4 of your limbs. You can place a hand or elbow on your thigh or rest a knee on the surface you are reaching across. This will provide you more stability, so that your muscles do not tire as quickly. By keeping your knees relaxed, or slightly bent, you will also reduce stress across your lower back. CORRECT LIFTING TECHNIQUES DO :  Assume a wide stance. This will provide you more stability and the opportunity to get as close as possible to the object which you are lifting.  Tense your abdominals to brace your spine. Bend at the knees and hips. Keeping your back locked in a neutral-spine position, lift using your leg muscles. Lift with your legs, keeping your back straight.  Test the weight of unknown objects before attempting to lift them.  Try to keep your elbows locked down at your sides in order get the best strength from your shoulders when carrying an object.  Always ask for help when lifting heavy or awkward objects. INCORRECT LIFTING TECHNIQUES DO NOT:   Lock  your knees when lifting, even if it is a small object.  Bend and twist. Pivot at your feet or move your feet when needing to change directions.  Assume that you can safely pick up even a paperclip without proper posture. Document Released: 09/28/2005 Document Revised: 12/21/2011 Document Reviewed: 01/10/2009 [facility] Patient Information 2015 [place], [place]. This information is not intended to replace advice given to you by your health care provider. Make sure you discuss any questions you have with your health care provider.

## 2015-05-07 ENCOUNTER — Telehealth: Payer: Self-pay | Admitting: Family Medicine

## 2015-05-07 NOTE — Telephone Encounter (Signed)
Patient dropped off FMLA forms on 05/06/2015. I have already completed forms and they just need review and signature. Please return to [facility] tray when done.

## 2015-05-09 DIAGNOSIS — Z0271 Encounter for disability determination: Secondary | ICD-10-CM

## 2015-05-10 NOTE — Telephone Encounter (Signed)
Done ready for pick-up 

## 2015-07-01 ENCOUNTER — Ambulatory Visit (INDEPENDENT_AMBULATORY_CARE_PROVIDER_SITE_OTHER): Payer: 59 | Admitting: Family Medicine

## 2015-07-01 ENCOUNTER — Ambulatory Visit (INDEPENDENT_AMBULATORY_CARE_PROVIDER_SITE_OTHER): Payer: 59

## 2015-07-01 VITALS — BP 122/72 | HR 89 | Temp 98.3°F | Resp 17 | Ht 65.5 in | Wt 199.0 lb

## 2015-07-01 DIAGNOSIS — S60551A Superficial foreign body of right hand, initial encounter: Secondary | ICD-10-CM | POA: Diagnosis not present

## 2015-07-01 DIAGNOSIS — Z23 Encounter for immunization: Secondary | ICD-10-CM | POA: Diagnosis not present

## 2015-07-01 DIAGNOSIS — S61401A Unspecified open wound of right hand, initial encounter: Secondary | ICD-10-CM

## 2015-07-01 DIAGNOSIS — I10 Essential (primary) hypertension: Secondary | ICD-10-CM

## 2015-07-01 DIAGNOSIS — S61411A Laceration without foreign body of right hand, initial encounter: Secondary | ICD-10-CM

## 2015-07-01 MED ORDER — DOXYCYCLINE HYCLATE 100 MG PO CAPS: 100.0000 mg | ORAL_CAPSULE | Freq: Two times a day (BID) | ORAL | Status: DC

## 2015-07-01 MED ORDER — AMLODIPINE BESYLATE 10 MG PO TABS: 10.0000 mg | ORAL_TABLET | Freq: Every day | ORAL | Status: DC

## 2015-07-01 NOTE — Progress Notes (Signed)
[facility] [address] [place] [address] [phone]  Date:  07/01/2015   Name:  Rachael Garcia   DOB:  [birth year]   MRN:  [id number]  PCP:  Robyn Haber, MD    Chief Complaint: Hand Pain and Medication Refill   History of Present Illness:  Rachael Garcia is a 59 y.o. very pleasant female patient who presents with the following:  She cut her right long finger on a piece of broken glass about 3 weeks ago.  She thought that she might need stitches, but did not end up getting it looked at.  She cut the dorsal aspect of the finger over the PIP joint- she reached into her fridge and there was a broken lightbulb that cut her hand.   The area developed a "knot" about 2 weeks ago and has been getting worse.  She has had some drainage that seemed purulent a few days ago- this is now resolved.    She needs a tetanus shot today- last in 2004 She is right handed Works in data entry  She also needs a RF of her norvasc- notes that her BP looks good, wonders if she might be able to decrease her dose of this medication.  She is able to check her BP at home  She had carpal tunnel surgery done in the past by Dr. Daylene Katayama and then ?Dr. Burney Gauze.  She was pleased with them and would like to go back to their office  BP Readings from Last 3 Encounters:  07/01/15 122/72  05/01/15 132/90  03/28/15 108/80      Patient Active Problem List   Diagnosis Date Noted  . HYPERLIPIDEMIA 08/14/2010  . ANXIETY 08/14/2010  . HYPERTENSION 08/14/2010  . RUQ PAIN 08/14/2010  . LACTOSE INTOLERANCE 08/13/2010  . SICKLE CELL TRAIT 08/13/2010  . GERD 08/13/2010  . IRRITABLE BOWEL SYNDROME 08/13/2010  . ALLERGIC RHINITIS 04/04/2007  . ASTHMA 04/04/2007    Past Medical History  Diagnosis Date  . Heart murmur   . Hypertension   . GERD (gastroesophageal reflux disease)   . Complication of anesthesia not sure    oxygen dropped post ? hyst  . Blood dyscrasia     sickle cell  trait  . Anemia   . Allergy   . Anxiety   . Arthritis   . IBS (irritable bowel syndrome)   . Hiatal hernia   . Diabetes mellitus without complication     Past Surgical History  Procedure Laterality Date  . Tubal ligation    . Dilation and curettage of uterus    . Colonoscopy    . Diagnostic laparoscopy      ovarian cyst removal  . Cesarean section  83,92    x 2  . Carpal tunnel release Right 03  . Carpal tunnel release  08/20/2011    Procedure: CARPAL TUNNEL RELEASE;  Surgeon: Cammie Sickle., MD;  Location: [place];  Service: Orthopedics;  Laterality: Left;    Social History  Substance Use Topics  . Smoking status: Former Smoker -- 8 years    Types: Cigarettes    Quit date: 07/13/2011  . Smokeless tobacco: Never Used  . Alcohol Use: 0.0 oz/week    0 Standard drinks or equivalent per week     Comment: OCCASIONALLY WINE    Family History  Problem Relation Age of Onset  . Anesthesia problems Sister   . Arthritis Mother   . Anemia Mother   .  Asthma Mother   . Diabetes Sister   . Hypertension Father   . Alcohol abuse Father   . Heart disease Father 98    defibrillator and pacemaker  . Stomach cancer Paternal Grandmother   . Multiple myeloma Maternal Grandmother     Allergies  Allergen Reactions  . Codeine     Hives   . Erythromycin     hives  . Penicillins     hives  . Sulfonamide Derivatives     swelling    Medication list has been reviewed and updated.  Current Outpatient Prescriptions on File Prior to Visit  Medication Sig Dispense Refill  . loratadine (CLARITIN) 10 MG tablet Take 1 tablet (10 mg total) by mouth daily. 30 tablet 4  . naproxen (NAPROSYN) 500 MG tablet Take 1 tablet (500 mg total) by mouth 2 (two) times daily with a meal. 30 tablet 0  . amLODipine (NORVASC) 10 MG tablet Take 1 tablet (10 mg total) by mouth daily. (Patient not taking: Reported on 07/01/2015) 90 tablet 3  . cyclobenzaprine (FLEXERIL) 5 MG tablet 1  to 2 tabs by mouth up to every 8 hours as needed. Start with one pill by mouth each bedtime as needed due to sedation (Patient not taking: Reported on 07/01/2015) 30 tablet 0  . hydrocortisone 2.5 % ointment Apply topically 2 (two) times daily. (Patient not taking: Reported on 07/01/2015) 30 g 1   No current facility-administered medications on file prior to visit.    Review of Systems:  As per HPI- otherwise negative.   Physical Examination: Filed Vitals:   07/01/15 0957  BP: 122/72  Pulse: 89  Temp: 98.3 F (36.8 C)  Resp: 17   Filed Vitals:   07/01/15 0957  Height: 5' 5.5" (1.664 m)  Weight: 199 lb (90.266 kg)   Body mass index is 32.6 kg/(m^2). Ideal Body Weight: Weight in (lb) to have BMI = 25: 152.2  GEN: WDWN, NAD, Non-toxic, A & O x 3, large build, looks well HEENT: Atraumatic, Normocephalic. Neck supple. No masses, No LAD. Ears and Nose: No external deformity. CV: RRR, No M/G/R. No JVD. No thrill. No extra heart sounds. PULM: CTA B, no wheezes, crackles, rhonchi. No retractions. No resp. distress. No accessory muscle use. EXTR: No c/c/e NEURO Normal gait.  PSYCH: Normally interactive. Conversant. Not depressed or anxious appearing.  Calm demeanor.  Right hand: the long finger displays a firm, tender nodule on the dorsal aspect.  Normal strength of the finger to flexion and extension. Reduced ROM to flexion due to nodule No heat, redness or drainage of nodule  [facility] reading (PRIMARY) by  Dr. Lorelei Pont. Right hand: there is a FB adjacent to the PIP of the 3rd finger- likely glass  RIGHT HAND - COMPLETE 3+ VIEW  COMPARISON: None.  FINDINGS: Along the ulnar side of the middle finger proximal interphalangeal joint, there is a triangular 0.6 by 0.4 cm flat structure suspicious for a shard of glass from the shattered light bulb. This is fairly deeply embedded in the dorsal-ulnar soft tissues, about 5 mm from the skin surface, with adjacent soft tissue swelling.  No  fracture or bony destructive findings. I am doubtful that this violates the joint capsule although this is not 726% certain.  Mild degenerative findings in the fingers.  IMPRESSION: 1. Triangular glass fragment along the volar- ulnar side of the proximal interphalangeal joint of the middle finger.   Assessment and Plan: Cut of right hand, initial encounter - Plan: DG  Hand Complete Right, doxycycline (VIBRAMYCIN) 100 MG capsule, Ambulatory referral to Hand Surgery  Essential hypertension - Plan: amLODipine (NORVASC) 10 MG tablet  Need for prophylactic vaccination with combined diphtheria-tetanus-pertussis (DTP) vaccine  Foreign body, hand, superficial, right, initial encounter - Plan: Ambulatory referral to Hand Surgery  We were able to get her in with Dr. Burney Gauze tomorrow- appreicate consult.  Will not attempt to remove glass today Did place in a finger splint for support tdap today Doxycycline as she states there was purulent discharge from her finger wound last week  Her BP is very well controlled.  Advised that she can take 1/2 norvasc (35m) and monitor her PB at home.  Will likely do fine on 5 mg  Signed JLamar Blinks MD

## 2015-07-01 NOTE — Patient Instructions (Signed)
You do have a little piece of glass in your hand- I will have you see a hand surgeon to look at this for you.   They may need to remove the piece of glass  Use the doxycycline antibiotic twice a day for 10 days for any infection Wear the splint as needed to protect the finger

## 2015-07-02 ENCOUNTER — Other Ambulatory Visit: Payer: Self-pay | Admitting: Orthopedic Surgery

## 2015-07-02 ENCOUNTER — Encounter (HOSPITAL_BASED_OUTPATIENT_CLINIC_OR_DEPARTMENT_OTHER): Payer: Self-pay | Admitting: *Deleted

## 2015-07-03 ENCOUNTER — Ambulatory Visit (HOSPITAL_BASED_OUTPATIENT_CLINIC_OR_DEPARTMENT_OTHER)
Admission: RE | Admit: 2015-07-03 | Discharge: 2015-07-03 | Disposition: A | Discharge status: Home / Self Care | Payer: 59 | Source: Ambulatory Visit | Attending: Orthopedic Surgery | Admitting: Orthopedic Surgery

## 2015-07-03 ENCOUNTER — Ambulatory Visit (HOSPITAL_BASED_OUTPATIENT_CLINIC_OR_DEPARTMENT_OTHER): Payer: 59 | Admitting: Anesthesiology

## 2015-07-03 ENCOUNTER — Encounter (HOSPITAL_BASED_OUTPATIENT_CLINIC_OR_DEPARTMENT_OTHER): Payer: Self-pay | Admitting: *Deleted

## 2015-07-03 ENCOUNTER — Encounter (HOSPITAL_BASED_OUTPATIENT_CLINIC_OR_DEPARTMENT_OTHER)
Admission: RE | Disposition: A | Discharge status: Home / Self Care | Payer: Self-pay | Source: Ambulatory Visit | Attending: Orthopedic Surgery

## 2015-07-03 DIAGNOSIS — Z882 Allergy status to sulfonamides status: Secondary | ICD-10-CM | POA: Diagnosis not present

## 2015-07-03 DIAGNOSIS — M71341 Other bursal cyst, right hand: Secondary | ICD-10-CM | POA: Diagnosis not present

## 2015-07-03 DIAGNOSIS — Z885 Allergy status to narcotic agent status: Secondary | ICD-10-CM | POA: Diagnosis not present

## 2015-07-03 DIAGNOSIS — R011 Cardiac murmur, unspecified: Secondary | ICD-10-CM | POA: Diagnosis not present

## 2015-07-03 DIAGNOSIS — Z6832 Body mass index (BMI) 32.0-32.9, adult: Secondary | ICD-10-CM | POA: Insufficient documentation

## 2015-07-03 DIAGNOSIS — Z880 Allergy status to penicillin: Secondary | ICD-10-CM | POA: Diagnosis not present

## 2015-07-03 DIAGNOSIS — K219 Gastro-esophageal reflux disease without esophagitis: Secondary | ICD-10-CM | POA: Diagnosis not present

## 2015-07-03 DIAGNOSIS — K589 Irritable bowel syndrome without diarrhea: Secondary | ICD-10-CM | POA: Diagnosis not present

## 2015-07-03 DIAGNOSIS — Z87891 Personal history of nicotine dependence: Secondary | ICD-10-CM | POA: Insufficient documentation

## 2015-07-03 DIAGNOSIS — Z881 Allergy status to other antibiotic agents status: Secondary | ICD-10-CM | POA: Diagnosis not present

## 2015-07-03 DIAGNOSIS — F419 Anxiety disorder, unspecified: Secondary | ICD-10-CM | POA: Insufficient documentation

## 2015-07-03 DIAGNOSIS — I10 Essential (primary) hypertension: Secondary | ICD-10-CM | POA: Diagnosis not present

## 2015-07-03 DIAGNOSIS — D573 Sickle-cell trait: Secondary | ICD-10-CM | POA: Insufficient documentation

## 2015-07-03 DIAGNOSIS — R2231 Localized swelling, mass and lump, right upper limb: Secondary | ICD-10-CM | POA: Diagnosis present

## 2015-07-03 DIAGNOSIS — M795 Residual foreign body in soft tissue: Secondary | ICD-10-CM | POA: Diagnosis not present

## 2015-07-03 HISTORY — PX: MASS EXCISION: SHX2000

## 2015-07-03 SURGERY — EXCISION MASS
Anesthesia: General | Site: Finger | Laterality: Right

## 2015-07-03 MED ORDER — OXYCODONE HCL 5 MG PO TABS: 5.0000 mg | ORAL_TABLET | Freq: Once | ORAL | Status: DC | PRN

## 2015-07-03 MED ORDER — FENTANYL CITRATE (PF) 100 MCG/2ML IJ SOLN
INTRAMUSCULAR | Status: AC
  Filled 2015-07-03: qty 4

## 2015-07-03 MED ORDER — MIDAZOLAM HCL 2 MG/2ML IJ SOLN
INTRAMUSCULAR | Status: AC
  Filled 2015-07-03: qty 4

## 2015-07-03 MED ORDER — CHLORHEXIDINE GLUCONATE 4 % EX LIQD: 60.0000 mL | Freq: Once | CUTANEOUS | Status: DC

## 2015-07-03 MED ORDER — LACTATED RINGERS IV SOLN
INTRAVENOUS | Status: DC
  Administered 2015-07-03: 10:00:00 via INTRAVENOUS

## 2015-07-03 MED ORDER — ONDANSETRON HCL 4 MG/2ML IJ SOLN
INTRAMUSCULAR | Status: DC | PRN
  Administered 2015-07-03: 4 mg via INTRAVENOUS

## 2015-07-03 MED ORDER — FENTANYL CITRATE (PF) 100 MCG/2ML IJ SOLN
50.0000 ug | INTRAMUSCULAR | Status: DC | PRN
  Administered 2015-07-03: 100 ug via INTRAVENOUS

## 2015-07-03 MED ORDER — BUPIVACAINE HCL (PF) 0.25 % IJ SOLN
INTRAMUSCULAR | Status: DC | PRN
  Administered 2015-07-03: 3 mL

## 2015-07-03 MED ORDER — LIDOCAINE HCL (CARDIAC) 20 MG/ML IV SOLN
INTRAVENOUS | Status: DC | PRN
  Administered 2015-07-03: 80 mg via INTRAVENOUS

## 2015-07-03 MED ORDER — VANCOMYCIN HCL IN DEXTROSE 1-5 GM/200ML-% IV SOLN
1000.0000 mg | INTRAVENOUS | Status: AC
  Administered 2015-07-03: 1000 mg via INTRAVENOUS

## 2015-07-03 MED ORDER — SCOPOLAMINE 1 MG/3DAYS TD PT72: 1.0000 | MEDICATED_PATCH | Freq: Once | TRANSDERMAL | Status: DC | PRN

## 2015-07-03 MED ORDER — OXYCODONE HCL 5 MG/5ML PO SOLN: 5.0000 mg | Freq: Once | ORAL | Status: DC | PRN

## 2015-07-03 MED ORDER — MEPERIDINE HCL 25 MG/ML IJ SOLN: 6.2500 mg | INTRAMUSCULAR | Status: DC | PRN

## 2015-07-03 MED ORDER — VANCOMYCIN HCL IN DEXTROSE 1-5 GM/200ML-% IV SOLN
INTRAVENOUS | Status: AC
  Filled 2015-07-03: qty 200

## 2015-07-03 MED ORDER — PROPOFOL 10 MG/ML IV BOLUS
INTRAVENOUS | Status: DC | PRN
  Administered 2015-07-03: 200 mg via INTRAVENOUS

## 2015-07-03 MED ORDER — HYDROMORPHONE HCL 1 MG/ML IJ SOLN
0.2500 mg | INTRAMUSCULAR | Status: DC | PRN
Start: 2015-07-03 — End: 2015-07-03

## 2015-07-03 MED ORDER — DEXAMETHASONE SODIUM PHOSPHATE 10 MG/ML IJ SOLN
INTRAMUSCULAR | Status: DC | PRN
  Administered 2015-07-03: 10 mg via INTRAVENOUS

## 2015-07-03 MED ORDER — MIDAZOLAM HCL 2 MG/2ML IJ SOLN
1.0000 mg | INTRAMUSCULAR | Status: DC | PRN
  Administered 2015-07-03: 2 mg via INTRAVENOUS

## 2015-07-03 MED ORDER — GLYCOPYRROLATE 0.2 MG/ML IJ SOLN: 0.2000 mg | Freq: Once | INTRAMUSCULAR | Status: DC | PRN

## 2015-07-03 MED ORDER — OXYCODONE-ACETAMINOPHEN 5-325 MG PO TABS: 1.0000 | ORAL_TABLET | ORAL | Status: DC | PRN

## 2015-07-03 SURGICAL SUPPLY — 48 items
APL SKNCLS STERI-STRIP NONHPOA (GAUZE/BANDAGES/DRESSINGS)
BAG DECANTER FOR FLEXI CONT (MISCELLANEOUS) IMPLANT
BANDAGE ELASTIC 3 VELCRO ST LF (GAUZE/BANDAGES/DRESSINGS) IMPLANT
BANDAGE ELASTIC 4 VELCRO ST LF (GAUZE/BANDAGES/DRESSINGS) ×2 IMPLANT
BENZOIN TINCTURE PRP APPL 2/3 (GAUZE/BANDAGES/DRESSINGS) IMPLANT
BLADE SURG 15 STRL LF DISP TIS (BLADE) ×1 IMPLANT
BLADE SURG 15 STRL SS (BLADE) ×2
BNDG CMPR 9X4 STRL LF SNTH (GAUZE/BANDAGES/DRESSINGS) ×1
BNDG ESMARK 4X9 LF (GAUZE/BANDAGES/DRESSINGS) ×1 IMPLANT
BNDG GAUZE ELAST 4 BULKY (GAUZE/BANDAGES/DRESSINGS) ×2 IMPLANT
CORDS BIPOLAR (ELECTRODE) ×2 IMPLANT
COVER BACK TABLE 60X90IN (DRAPES) ×2 IMPLANT
CUFF TOURNIQUET SINGLE 18IN (TOURNIQUET CUFF) ×1 IMPLANT
DECANTER SPIKE VIAL GLASS SM (MISCELLANEOUS) ×1 IMPLANT
DRAPE EXTREMITY T 121X128X90 (DRAPE) ×2 IMPLANT
DRAPE SURG 17X23 STRL (DRAPES) ×2 IMPLANT
DURAPREP 26ML APPLICATOR (WOUND CARE) ×2 IMPLANT
GAUZE SPONGE 4X4 12PLY STRL (GAUZE/BANDAGES/DRESSINGS) ×2 IMPLANT
GAUZE XEROFORM 1X8 LF (GAUZE/BANDAGES/DRESSINGS) ×1 IMPLANT
GLOVE BIOGEL M STRL SZ7.5 (GLOVE) ×1 IMPLANT
GLOVE BIOGEL PI IND STRL 8.5 (GLOVE) IMPLANT
GLOVE BIOGEL PI INDICATOR 8.5 (GLOVE) ×1
GLOVE SURG SYN 8.0 (GLOVE) ×4 IMPLANT
GLOVE SURG SYN 8.0 PF PI (GLOVE) ×2 IMPLANT
GOWN STRL REUS W/ TWL LRG LVL3 (GOWN DISPOSABLE) ×1 IMPLANT
GOWN STRL REUS W/TWL LRG LVL3 (GOWN DISPOSABLE)
GOWN STRL REUS W/TWL XL LVL3 (GOWN DISPOSABLE) ×4 IMPLANT
NDL HYPO 25X1 1.5 SAFETY (NEEDLE) IMPLANT
NEEDLE HYPO 25X1 1.5 SAFETY (NEEDLE) ×2 IMPLANT
NS IRRIG 1000ML POUR BTL (IV SOLUTION) ×2 IMPLANT
PACK BASIN DAY SURGERY FS (CUSTOM PROCEDURE TRAY) ×2 IMPLANT
PAD CAST 3X4 CTTN HI CHSV (CAST SUPPLIES) ×1 IMPLANT
PADDING CAST COTTON 3X4 STRL (CAST SUPPLIES)
SHEET MEDIUM DRAPE 40X70 STRL (DRAPES) ×2 IMPLANT
SPLINT PLASTER CAST XFAST 4X15 (CAST SUPPLIES) ×5 IMPLANT
SPLINT PLASTER XTRA FAST SET 4 (CAST SUPPLIES)
STOCKINETTE 4X48 STRL (DRAPES) ×2 IMPLANT
STRIP CLOSURE SKIN 1/2X4 (GAUZE/BANDAGES/DRESSINGS) IMPLANT
SUT ETHILON 5 0 PS 2 18 (SUTURE) ×1 IMPLANT
SUT PROLENE 3 0 PS 2 (SUTURE) IMPLANT
SUT VIC AB 4-0 P-3 18XBRD (SUTURE) IMPLANT
SUT VIC AB 4-0 P3 18 (SUTURE)
SUT VICRYL RAPIDE 4-0 (SUTURE) IMPLANT
SUT VICRYL RAPIDE 4/0 PS 2 (SUTURE) IMPLANT
SYR BULB 3OZ (MISCELLANEOUS) ×2 IMPLANT
SYRINGE 10CC LL (SYRINGE) ×1 IMPLANT
TOWEL OR 17X24 6PK STRL BLUE (TOWEL DISPOSABLE) ×2 IMPLANT
UNDERPAD 30X30 (UNDERPADS AND DIAPERS) ×2 IMPLANT

## 2015-07-03 NOTE — Anesthesia Postprocedure Evaluation (Signed)
  Anesthesia Post-op Note  Patient: Rachael Garcia  Procedure(s) Performed: Procedure(s): EXCISION RIGHT LONG FINGER MASS (Right)  Patient Location: PACU  Anesthesia Type: General   Level of Consciousness: awake, alert  and oriented  Airway and Oxygen Therapy: Patient Spontanous Breathing  Post-op Pain: none  Post-op Assessment: Post-op Vital signs reviewed  Post-op Vital Signs: Reviewed  Last Vitals:  Filed Vitals:   07/03/15 1109  BP: 142/76  Pulse: 68  Temp: 36.6 C  Resp: 18    Complications: No apparent anesthesia complications

## 2015-07-03 NOTE — Op Note (Signed)
See note [id number]

## 2015-07-03 NOTE — Anesthesia Procedure Notes (Signed)
Procedure Name: LMA Insertion Date/Time: 07/03/2015 9:46 AM Performed by: Maryella Shivers Pre-anesthesia Checklist: Patient identified, Emergency Drugs available, Suction available and Patient being monitored Patient Re-evaluated:Patient Re-evaluated prior to inductionOxygen Delivery Method: Circle System Utilized Preoxygenation: Pre-oxygenation with 100% oxygen Intubation Type: IV induction Ventilation: Mask ventilation without difficulty LMA: LMA inserted LMA Size: 5.0 Number of attempts: 1 Airway Equipment and Method: Bite block Placement Confirmation: positive ETCO2 Tube secured with: Tape Dental Injury: Teeth and Oropharynx as per pre-operative assessment

## 2015-07-03 NOTE — Discharge Instructions (Signed)

## 2015-07-03 NOTE — H&P (Signed)
Rachael Garcia is an 59 y.o. female.   Chief Complaint: right long finger painful mass HPI: as above with increasing pain and swelling over right long PIPJ  Past Medical History  Diagnosis Date  . Heart murmur   . Hypertension   . GERD (gastroesophageal reflux disease)   . Complication of anesthesia not sure    oxygen dropped post ? hyst  . Blood dyscrasia     sickle cell trait  . Anemia   . Allergy   . Anxiety   . Arthritis   . IBS (irritable bowel syndrome)     Past Surgical History  Procedure Laterality Date  . Tubal ligation    . Dilation and curettage of uterus    . Colonoscopy    . Diagnostic laparoscopy      ovarian cyst removal  . Cesarean section  83,92    x 2  . Carpal tunnel release Right 03  . Carpal tunnel release  08/20/2011    Procedure: CARPAL TUNNEL RELEASE;  Surgeon: Cammie Sickle., MD;  Location: [facility];  Service: Orthopedics;  Laterality: Left;    Family History  Problem Relation Age of Onset  . Anesthesia problems Sister   . Arthritis Mother   . Anemia Mother   . Asthma Mother   . Diabetes Sister   . Hypertension Father   . Alcohol abuse Father   . Heart disease Father 35    defibrillator and pacemaker  . Stomach cancer Paternal Grandmother   . Multiple myeloma Maternal Grandmother    Social History:  reports that she quit smoking about 3 years ago. Her smoking use included Cigarettes. She quit after 8 years of use. She has never used smokeless tobacco. She reports that she drinks alcohol. She reports that she does not use illicit drugs.  Allergies:  Allergies  Allergen Reactions  . Codeine     Hives   . Erythromycin     hives  . Penicillins     hives  . Sulfonamide Derivatives     swelling    No prescriptions prior to admission    No results found for this or any previous visit (from the past 48 hour(s)). Dg Hand Complete Right  07/01/2015   CLINICAL DATA:  Light bulb broke in patient's hand, middle  finger pain and swelling.  EXAM: RIGHT HAND - COMPLETE 3+ VIEW  COMPARISON:  None.  FINDINGS: Along the ulnar side of the middle finger proximal interphalangeal joint, there is a triangular 0.6 by 0.4 cm flat structure suspicious for a shard of glass from the shattered light bulb. This is fairly deeply embedded in the dorsal-ulnar soft tissues, about 5 mm from the skin surface, with adjacent soft tissue swelling.  No fracture or bony destructive findings. I am doubtful that this violates the joint capsule although this is not 161% certain.  Mild degenerative findings in the fingers.  IMPRESSION: 1. Triangular glass fragment along the volar- ulnar side of the proximal interphalangeal joint of the middle finger.   Electronically Signed   By: Van Clines M.D.   On: 07/01/2015 11:08    Review of Systems  All other systems reviewed and are negative.   Height 5' 5" (1.651 m), weight 90.266 kg (199 lb). Physical Exam  Constitutional: She is oriented to person, place, and time. She appears well-developed and well-nourished.  HENT:  Head: Normocephalic and atraumatic.  Cardiovascular: Normal rate.   Respiratory: Effort normal.  Musculoskeletal:  Right hand: She exhibits tenderness and swelling.  Right long finger PIPJ mass  Neurological: She is alert and oriented to person, place, and time.  Skin: Skin is warm.  Psychiatric: She has a normal mood and affect. Her behavior is normal. Judgment and thought content normal.     Assessment/Plan As above   Plan excision of above  WEINGOLD,MATTHEW A 07/03/2015, 7:15 AM

## 2015-07-03 NOTE — Anesthesia Preprocedure Evaluation (Signed)
Anesthesia Evaluation  Patient identified by MRN, date of birth, ID band Patient awake    Reviewed: Allergy & Precautions, NPO status , Patient's Chart, lab work & pertinent test results  Airway Mallampati: I  TM Distance: >3 FB Neck ROM: Full    Dental  (+) Teeth Intact, Dental Advisory Given   Pulmonary former smoker,    breath sounds clear to auscultation       Cardiovascular hypertension, Pt. on medications  Rhythm:Regular Rate:Normal     Neuro/Psych    GI/Hepatic GERD  Medicated and Controlled,  Endo/Other  Morbid obesity  Renal/GU      Musculoskeletal   Abdominal   Peds  Hematology   Anesthesia Other Findings   Reproductive/Obstetrics                             Anesthesia Physical Anesthesia Plan  ASA: III  Anesthesia Plan: General   Post-op Pain Management:    Induction: Intravenous  Airway Management Planned: LMA  Additional Equipment:   Intra-op Plan:   Post-operative Plan: Extubation in OR  Informed Consent: I have reviewed the patients History and Physical, chart, labs and discussed the procedure including the risks, benefits and alternatives for the proposed anesthesia with the patient or authorized representative who has indicated his/her understanding and acceptance.   Dental advisory given  Plan Discussed with: CRNA, Anesthesiologist and Surgeon  Anesthesia Plan Comments:         Anesthesia Quick Evaluation

## 2015-07-03 NOTE — Op Note (Signed)
NAMEEVOLEHT, HOVATTER               ACCOUNT NO.:  [id number]  MEDICAL RECORD NO.:  [id number]  LOCATION:                               FACILITY:  [facility]  PHYSICIAN:  Sheral Apley. Zakaria Fromer, M.D.DATE OF BIRTH:  20-Sep-1956  DATE OF PROCEDURE:  07/03/2015 DATE OF DISCHARGE:  07/03/2015                              OPERATIVE REPORT   PREOPERATIVE DIAGNOSIS:  Right long finger ulnar-sided proximal interphalangeal joint deep mass.  POSTOPERATIVE DIAGNOSIS:  Right long finger ulnar-sided proximal interphalangeal joint deep mass.  PROCEDURE:  Excisional biopsy, inclusion cyst, right long finger.  SURGEON:  Sheral Apley. Burney Gauze, M.D.  ASSISTANT:  None.  ANESTHESIA:  General.  COMPLICATIONS:  No complications.  DRAINS:  No drains.  SPECIMEN:  One specimen sent.  DESCRIPTION OF PROCEDURE:  The patient was taken to the operating suite. After induction of adequate general anesthetic, right upper extremity was prepped and draped in usual sterile fashion.  An Esmarch was used to exsanguinate the limb.  Tourniquet was inflated to 250 mmHg.  At this point in time, an incision was made along the ulnar side, dorsoulnar of the PIP joint, right long finger, where a 1 x 1 cm mass overlying the soft tissues was identified.  We incised the skin 3 cm.  Dissection was carried down interval between the common extensor and lateral band. There was a 1.5 x 1 cm mass consistent with an inclusion cyst, carefully dissected free.  We removed it from the operative field.  We opened that, there was a large piece of glass.  The wound was thoroughly irrigated.  We sent the specimen for pathologic confirmation.  We achieved hemostasis with bipolar cautery.  We loosely closed it with three 4-0 nylon horizontal mattress sutures.  Xeroform, 4x4s, and Coban wrap were applied.  The patient tolerated the procedure well, went to the recovery room in stable fashion.     Sheral Apley Burney Gauze, M.D.    MAW/MEDQ  D:   07/03/2015  T:  07/03/2015  Job:  [id number]

## 2015-07-03 NOTE — Transfer of Care (Signed)
Immediate Anesthesia Transfer of Care Note  Patient: Rachael Garcia  Procedure(s) Performed: Procedure(s): EXCISION RIGHT LONG FINGER MASS (Right)  Patient Location: PACU  Anesthesia Type:General  Level of Consciousness: sedated  Airway & Oxygen Therapy: Patient Spontanous Breathing and Patient connected to face mask oxygen  Post-op Assessment: Report given to RN and Post -op Vital signs reviewed and stable  Post vital signs: Reviewed and stable  Last Vitals:  Filed Vitals:   07/03/15 1009  BP:   Pulse: 79  Temp:   Resp:     Complications: No apparent anesthesia complications

## 2015-07-04 ENCOUNTER — Encounter (HOSPITAL_BASED_OUTPATIENT_CLINIC_OR_DEPARTMENT_OTHER): Payer: Self-pay | Admitting: Orthopedic Surgery

## 2015-09-13 ENCOUNTER — Other Ambulatory Visit: Payer: Self-pay

## 2015-09-13 DIAGNOSIS — Z1231 Encounter for screening mammogram for malignant neoplasm of breast: Secondary | ICD-10-CM

## 2015-09-17 ENCOUNTER — Ambulatory Visit (INDEPENDENT_AMBULATORY_CARE_PROVIDER_SITE_OTHER): Payer: 59 | Admitting: Family Medicine

## 2015-09-17 ENCOUNTER — Encounter: Payer: Self-pay | Admitting: Family Medicine

## 2015-09-17 VITALS — BP 148/93 | HR 75 | Temp 98.0°F | Resp 16 | Ht 65.0 in | Wt 201.0 lb

## 2015-09-17 DIAGNOSIS — E785 Hyperlipidemia, unspecified: Secondary | ICD-10-CM | POA: Diagnosis not present

## 2015-09-17 DIAGNOSIS — R7309 Other abnormal glucose: Secondary | ICD-10-CM | POA: Diagnosis not present

## 2015-09-17 DIAGNOSIS — J302 Other seasonal allergic rhinitis: Secondary | ICD-10-CM | POA: Diagnosis not present

## 2015-09-17 DIAGNOSIS — R05 Cough: Secondary | ICD-10-CM

## 2015-09-17 DIAGNOSIS — R062 Wheezing: Secondary | ICD-10-CM

## 2015-09-17 DIAGNOSIS — Z124 Encounter for screening for malignant neoplasm of cervix: Secondary | ICD-10-CM | POA: Diagnosis not present

## 2015-09-17 DIAGNOSIS — I10 Essential (primary) hypertension: Secondary | ICD-10-CM

## 2015-09-17 DIAGNOSIS — J339 Nasal polyp, unspecified: Secondary | ICD-10-CM

## 2015-09-17 DIAGNOSIS — R059 Cough, unspecified: Secondary | ICD-10-CM

## 2015-09-17 MED ORDER — ALBUTEROL SULFATE HFA 108 (90 BASE) MCG/ACT IN AERS
2.0000 | INHALATION_SPRAY | Freq: Four times a day (QID) | RESPIRATORY_TRACT | Status: DC | PRN
Start: 2015-09-17 — End: 2016-09-15

## 2015-09-17 NOTE — Progress Notes (Signed)
Subjective:    Patient ID: Rachael Garcia, female    DOB: [birth year], 59 y.o.   MRN: [id number]  HPI This is a 59 yo female who presents today for PAP. She had a complete physical 1/16 without a PAP. Her gyn left her insurance network and so she presents today.  She had elevated lipids at last visit, will recheck those today. She also had some elevated hemoglobin [name] and we will recheck today.   She has been having a dry cough for a week. Feels like cotton is caught at top of her chest. No wheeze, no pain with deep breathing. No fever. No headache or sinus pressure. Some SOB. She wonders if this is more related to her weight. Has started taking stairs at work again. Has been told she has bronchial asthma. Was prescribed albuterol in the past, never got it filled. Has seasonal allergies and takes loratadine nightly. Has tried flonase in the past, doesn't like nasal sprays.   Past Medical History  Diagnosis Date  . Heart murmur   . Hypertension   . GERD (gastroesophageal reflux disease)   . Complication of anesthesia not sure    oxygen dropped post ? hyst  . Blood dyscrasia     sickle cell trait  . Anemia   . Allergy   . Anxiety   . Arthritis   . IBS (irritable bowel syndrome)    Past Surgical History  Procedure Laterality Date  . Tubal ligation    . Dilation and curettage of uterus    . Colonoscopy    . Diagnostic laparoscopy      ovarian cyst removal  . Cesarean section  83,92    x 2  . Carpal tunnel release Right 03  . Carpal tunnel release  08/20/2011    Procedure: CARPAL TUNNEL RELEASE;  Surgeon: Cammie Sickle., MD;  Location: [facility];  Service: Orthopedics;  Laterality: Left;  Marland Kitchen Mass excision Right 07/03/2015    Procedure: EXCISION RIGHT LONG FINGER MASS;  Surgeon: Charlotte Crumb, MD;  Location: [facility];  Service: Orthopedics;  Laterality: Right;   Family History  Problem Relation Age of Onset  . Anesthesia problems Sister   .  Arthritis Mother   . Anemia Mother   . Asthma Mother   . Diabetes Sister   . Hypertension Father   . Alcohol abuse Father   . Heart disease Father 43    defibrillator and pacemaker  . Stomach cancer Paternal Grandmother   . Multiple myeloma Maternal Grandmother    Social History  Substance Use Topics  . Smoking status: Former Smoker -- 8 years    Types: Cigarettes    Quit date: 07/13/2011  . Smokeless tobacco: Never Used  . Alcohol Use: 0.0 oz/week    0 Standard drinks or equivalent per week     Comment: OCCASIONALLY WINE    Review of Systems  Constitutional: Negative for fever, chills and fatigue.  HENT: Negative for congestion, ear pain, rhinorrhea and sinus pressure.   Respiratory: Positive for cough and shortness of breath. Negative for chest tightness and wheezing.   Cardiovascular: Negative for chest pain, palpitations and leg swelling.  Genitourinary: Negative for frequency, hematuria, vaginal bleeding, vaginal discharge, vaginal pain, pelvic pain and dyspareunia.      Objective:   Physical Exam  Constitutional: She is oriented to person, place, and time. She appears well-developed and well-nourished.  HENT:  Head: Normocephalic and atraumatic.  Right Ear: Tympanic  membrane, external ear and ear canal normal.  Left Ear: Tympanic membrane, external ear and ear canal normal.  Nose: Nasal deformity (?right nasal polyp) present. Right sinus exhibits no maxillary sinus tenderness and no frontal sinus tenderness. Left sinus exhibits no maxillary sinus tenderness and no frontal sinus tenderness.  Mouth/Throat: Uvula is midline, oropharynx is clear and moist and mucous membranes are normal.  Cardiovascular: Normal rate, regular rhythm and normal heart sounds.   Pulmonary/Chest: Effort normal and breath sounds normal. No respiratory distress. She has no wheezes. She has no rales. She exhibits no tenderness.  Genitourinary: Vagina normal. Pelvic exam was performed with patient  supine. No labial fusion. There is no rash, tenderness, lesion or injury on the right labia. There is no rash, tenderness, lesion or injury on the left labia. Cervix exhibits no discharge and no friability.  Musculoskeletal: Normal range of motion.  Neurological: She is alert and oriented to person, place, and time.  Skin: Skin is warm and dry.  Psychiatric: She has a normal mood and affect. Her behavior is normal. Judgment and thought content normal.  Vitals reviewed.  BP 148/93 mmHg  Pulse 75  Temp(Src) 98 F (36.7 C)  Resp 16  Ht '5\' 5"'  (1.651 m)  Wt 201 lb (91.173 kg)  BMI 33.45 kg/m2 Wt Readings from Last 3 Encounters:  09/17/15 201 lb (91.173 kg)  07/03/15 197 lb (89.359 kg)  07/01/15 199 lb (90.266 kg)       Assessment & Plan:  1. Essential hypertension - tolerating amlodipine 10 mg, continue this dose   2. Other seasonal allergic rhinitis - discussed treatment options, encouraged her to consider inhaled nasal steroid. Can also try TID-QID saline spray.  3. Hyperlipidemia - Lipid panel  4. Screening for cervical cancer - Pap IG, CT/NG w/ reflex HPV when ASC-U  5. Elevated glucose - Hemoglobin [name]  6. Nasal polyp - Ambulatory referral to ENT  7. Cough - albuterol (PROVENTIL HFA;VENTOLIN HFA) 108 (90 BASE) MCG/ACT inhaler; Inhale 2 puffs into the lungs every 6 (six) hours as needed for wheezing or shortness of breath.  Dispense: 8 g; Refill: 1  8. Wheeze - albuterol (PROVENTIL HFA;VENTOLIN HFA) 108 (90 BASE) MCG/ACT inhaler; Inhale 2 puffs into the lungs every 6 (six) hours as needed for wheezing or shortness of breath.  Dispense: 8 g; Refill: 1 -RTC precautions- no improvement with albuterol, mucinex, hydration. Fever/chills/purulent sputum.   Clarene Reamer, FNP-BC  [facility]  09/19/2015 8:43 PM

## 2015-09-17 NOTE — Patient Instructions (Signed)
Please continue mucinex for your secretions Drink 8-10 glasses of water daily Try to use some saline nasal spray for nasal congestion Continue your loratadine

## 2015-09-18 LAB — LIPID PANEL
CHOL/HDL RATIO: 3.9 ratio (ref 0.0–4.4)
Cholesterol, Total: 238 mg/dL — ABNORMAL HIGH (ref 100–199)
HDL: 61 mg/dL (ref >39)
LDL Calculated: 159 mg/dL — ABNORMAL HIGH (ref 0–99)
Triglycerides: 91 mg/dL (ref 0–149)
VLDL CHOLESTEROL CAL: 18 mg/dL (ref 5–40)

## 2015-09-18 LAB — HEMOGLOBIN A1C
ESTIMATED AVERAGE GLUCOSE: 154 mg/dL
HEMOGLOBIN A1C: 7 % — AB (ref 4.8–5.6)

## 2015-09-22 ENCOUNTER — Encounter: Payer: Self-pay | Admitting: Family Medicine

## 2015-09-22 LAB — PAP IG, CT-NG, RFX HPV ASCU
Chlamydia, Nuc. Acid Amp: NEGATIVE
Gonococcus by Nucleic Acid Amp: NEGATIVE
PAP SMEAR COMMENT: 0

## 2015-10-03 ENCOUNTER — Ambulatory Visit
Admission: RE | Admit: 2015-10-03 | Discharge: 2015-10-03 | Disposition: A | Discharge status: Home / Self Care | Payer: 59 | Source: Ambulatory Visit

## 2015-10-03 DIAGNOSIS — Z1231 Encounter for screening mammogram for malignant neoplasm of breast: Secondary | ICD-10-CM

## 2015-11-05 ENCOUNTER — Other Ambulatory Visit: Payer: Self-pay | Admitting: Family Medicine

## 2015-12-05 DIAGNOSIS — J343 Hypertrophy of nasal turbinates: Secondary | ICD-10-CM | POA: Insufficient documentation

## 2016-05-28 ENCOUNTER — Ambulatory Visit (INDEPENDENT_AMBULATORY_CARE_PROVIDER_SITE_OTHER): Payer: BLUE CROSS/BLUE SHIELD | Admitting: Physician Assistant

## 2016-05-28 ENCOUNTER — Encounter: Payer: Self-pay | Admitting: Physician Assistant

## 2016-05-28 VITALS — BP 110/70 | HR 65 | Temp 98.1°F | Resp 16 | Ht 65.0 in | Wt 198.0 lb

## 2016-05-28 DIAGNOSIS — J029 Acute pharyngitis, unspecified: Secondary | ICD-10-CM | POA: Diagnosis not present

## 2016-05-28 DIAGNOSIS — R05 Cough: Secondary | ICD-10-CM

## 2016-05-28 DIAGNOSIS — R059 Cough, unspecified: Secondary | ICD-10-CM

## 2016-05-28 LAB — POCT RAPID STREP A (OFFICE): Rapid Strep A Screen: NEGATIVE

## 2016-05-28 MED ORDER — BENZONATATE 100 MG PO CAPS: 100.0000 mg | ORAL_CAPSULE | Freq: Three times a day (TID) | ORAL | 0 refills | Status: DC | PRN

## 2016-05-28 MED ORDER — CETIRIZINE HCL 10 MG PO TABS: 10.0000 mg | ORAL_TABLET | Freq: Every day | ORAL | 11 refills | Status: DC

## 2016-05-28 MED ORDER — PROMETHAZINE-DM 6.25-15 MG/5ML PO SYRP: 5.0000 mL | ORAL_SOLUTION | Freq: Four times a day (QID) | ORAL | 0 refills | Status: DC | PRN

## 2016-05-28 NOTE — Progress Notes (Signed)
Rachael Garcia  MRN: [id number] DOB: 03-12-1956  Subjective:  Rachael Garcia is a 60 y.o. female seen in office today for a chief complaint of sore throat x 1 day. Has associated raspy voice, trouble swallowing , seasonal allergies, and dry cough. Denies nasal congestion, fever, SOB, and wheezing. Has been around grandson who has been sick.   Pt has as a history of upper respitaroy issues and severe yearly allergies. Feel as if claritin is not working anymore. Notes that around this time each year she tends to have worsening of symptoms.   Pt has tried hot liquids and salt water gargles with moderate relief. Pt is still using flonase daily.   Review of Systems  Constitutional: Negative for unexpected weight change.  Eyes: Positive for discharge (watery). Negative for itching.  Cardiovascular: Negative for chest pain and palpitations.  Neurological: Positive for dizziness ( baseline).  Psychiatric/Behavioral: Positive for sleep disturbance.    Patient Active Problem List   Diagnosis Date Noted  . Hyperlipidemia 08/14/2010  . ANXIETY 08/14/2010  . Essential hypertension 08/14/2010  . RUQ PAIN 08/14/2010  . LACTOSE INTOLERANCE 08/13/2010  . SICKLE CELL TRAIT 08/13/2010  . GERD 08/13/2010  . IRRITABLE BOWEL SYNDROME 08/13/2010  . Allergic rhinitis 04/04/2007  . ASTHMA 04/04/2007    Current Outpatient Prescriptions on File Prior to Visit  Medication Sig Dispense Refill  . albuterol (PROVENTIL HFA;VENTOLIN HFA) 108 (90 BASE) MCG/ACT inhaler Inhale 2 puffs into the lungs every 6 (six) hours as needed for wheezing or shortness of breath. 8 g 1  . amLODipine (NORVASC) 10 MG tablet Take 1 tablet (10 mg total) by mouth daily. 90 tablet 3  . loratadine (CLARITIN) 10 MG tablet take 1 tablet by mouth once daily 30 tablet 4   No current facility-administered medications on file prior to visit.     Allergies  Allergen Reactions  . Codeine     Hives   . Erythromycin     hives  .  Penicillins     hives  . Sulfonamide Derivatives     swelling   Social History   Social History  . Marital status: Widowed    Spouse name: N/A  . Number of children: 2  . Years of education: N/A   Occupational History  . insurance agent/broker [place] History Main Topics  . Smoking status: Former Smoker    Years: 8.00    Types: Cigarettes    Quit date: 07/13/2011  . Smokeless tobacco: Never Used  . Alcohol use 0.0 oz/week     Comment: OCCASIONALLY WINE  . Drug use: No  . Sexual activity: Not Currently    Birth control/ protection: Post-menopausal   Other Topics Concern  . Not on file   Social History Narrative  . No narrative on file    Objective:  BP 110/70 (BP Location: Right Arm, Patient Position: Sitting, Cuff Size: Normal)   Pulse 65   Temp 98.1 F (36.7 C) (Oral)   Resp 16   Ht 5\' 5"  (1.651 m)   Wt 198 lb (89.8 kg)   SpO2 98%   BMI 32.95 kg/m   Physical Exam  Constitutional: She is oriented to person, place, and time and well-developed, well-nourished, and in no distress.  HENT:  Head: Normocephalic and atraumatic.  Right Ear: Tympanic membrane, external ear and ear canal normal.  Left Ear: Tympanic membrane, external ear and ear canal normal.  Nose: Mucosal edema (baseline for  patient) present.  Mouth/Throat: Uvula is midline and mucous membranes are normal. Posterior oropharyngeal erythema present.  Eyes: Right conjunctiva is injected. Left conjunctiva is injected.  Neck: Normal range of motion.  Pulmonary/Chest: Effort normal.  Lymphadenopathy:       Head (right side): Submandibular adenopathy present. No submental, no tonsillar, no preauricular, no posterior auricular and no occipital adenopathy present.       Head (left side): Submandibular adenopathy present. No submental, no tonsillar, no preauricular, no posterior auricular and no occipital adenopathy present.    She has no cervical adenopathy.        Right: No supraclavicular adenopathy present.       Left: No supraclavicular adenopathy present.  Neurological: She is alert and oriented to person, place, and time. Gait normal.  Skin: Skin is warm and dry.  Psychiatric: Affect normal.  Vitals reviewed.  No results found for this or any previous visit (from the past 24 hour(s)).  Assessment and Plan :  1. Sore throat -History and physical exam findings consistent with acute laryngitis, likely triggered by allergens. -Recommend to gain better control over allergies and supportive care  - POCT rapid strep A -Pt already has appointment scheduled with ENT on 06/28/16 -Pt to follow up in one week for annual physical exam. Will check labs. Consider zantac as option at this time if laryngitis not improved.    Tenna Delaine PA-C  [facility] 05/28/2016 3:04 PM

## 2016-05-28 NOTE — Patient Instructions (Addendum)
-   We will treat this as a respiratory viral infection.  - I recommend you rest, drink plenty of fluids, eat light meals including soups.  - You may use cough syrup at night for your cough and sore throat, Tessalon pearls during the day.  - You may also use Tylenol or ibuprofen over-the-counter for your sore throat.  -OTC antihistamine eye drops  -Begin taking zyrtec daily, discontinue loratidine -Continue flonase -OTC throat lozenges -OTC chloroseptic spray -Follow up in one week for annual physical exam    Laryngitis Laryngitis is swelling (inflammation) of your vocal cords. This causes hoarseness, coughing, loss of voice, sore throat, or a dry throat. When your vocal cords are inflamed, your voice sounds different. Laryngitis can be temporary (acute) or long-term (chronic). Most cases of acute laryngitis improve with time. Chronic laryngitis is laryngitis that lasts for more than three weeks. HOME CARE  Drink enough fluid to keep your pee (urine) clear or pale yellow.  Breathe in moist air. Use a humidifier if you live in a dry climate.  Take medicines only as told by your doctor.  Do not smoke cigarettes or electronic cigarettes. If you need help quitting, ask your doctor.  Talk as little as possible. Also avoid whispering, which can cause vocal strain.  Write instead of talking. Do this until your voice is back to normal. GET HELP IF:  You have a fever.  Your pain is worse.  You have trouble swallowing. GET HELP RIGHT AWAY IF:  You cough up blood.  You have trouble breathing.   This information is not intended to replace advice given to you by your health care provider. Make sure you discuss any questions you have with your health care provider.   Document Released: 09/17/2011 Document Revised: 10/19/2014 Document Reviewed: 03/13/2014 Elsevier Interactive Patient Education 2016 [employer].      IF you received an x-ray today, you will receive an invoice from  [facility]. Please contact [facility] at [phone] with questions or concerns regarding your invoice.   IF you received labwork today, you will receive an invoice from [employer]. Please contact [facility] at [phone] with questions or concerns regarding your invoice.   Our billing staff will not be able to assist you with questions regarding bills from these companies.  You will be contacted with the lab results as soon as they are available. The fastest way to get your results is to activate your My Chart account. Instructions are located on the last page of this paperwork. If you have not heard from [place] regarding the results in 2 weeks, please contact this office.

## 2016-05-30 LAB — CULTURE, GROUP A STREP: ORGANISM ID, BACTERIA: NORMAL

## 2016-06-25 DIAGNOSIS — R1313 Dysphagia, pharyngeal phase: Secondary | ICD-10-CM | POA: Insufficient documentation

## 2016-06-30 ENCOUNTER — Other Ambulatory Visit: Payer: Self-pay | Admitting: Family Medicine

## 2016-07-02 ENCOUNTER — Ambulatory Visit (INDEPENDENT_AMBULATORY_CARE_PROVIDER_SITE_OTHER): Payer: BLUE CROSS/BLUE SHIELD | Admitting: Physician Assistant

## 2016-07-02 VITALS — BP 134/80 | HR 65 | Temp 98.3°F | Resp 16 | Ht 65.0 in | Wt 198.4 lb

## 2016-07-02 DIAGNOSIS — Z114 Encounter for screening for human immunodeficiency virus [HIV]: Secondary | ICD-10-CM

## 2016-07-02 DIAGNOSIS — Z0000 Encounter for general adult medical examination without abnormal findings: Secondary | ICD-10-CM

## 2016-07-02 DIAGNOSIS — I10 Essential (primary) hypertension: Secondary | ICD-10-CM

## 2016-07-02 DIAGNOSIS — E785 Hyperlipidemia, unspecified: Secondary | ICD-10-CM | POA: Diagnosis not present

## 2016-07-02 DIAGNOSIS — N390 Urinary tract infection, site not specified: Secondary | ICD-10-CM | POA: Diagnosis not present

## 2016-07-02 DIAGNOSIS — Z1159 Encounter for screening for other viral diseases: Secondary | ICD-10-CM | POA: Diagnosis not present

## 2016-07-02 DIAGNOSIS — R5383 Other fatigue: Secondary | ICD-10-CM

## 2016-07-02 DIAGNOSIS — E119 Type 2 diabetes mellitus without complications: Secondary | ICD-10-CM

## 2016-07-02 DIAGNOSIS — R3915 Urgency of urination: Secondary | ICD-10-CM

## 2016-07-02 LAB — POCT URINALYSIS DIP (MANUAL ENTRY)
Bilirubin, UA: NEGATIVE
GLUCOSE UA: NEGATIVE
Ketones, POC UA: NEGATIVE
NITRITE UA: NEGATIVE
PH UA: 6
PROTEIN UA: NEGATIVE
RBC UA: NEGATIVE
SPEC GRAV UA: 1.01
UROBILINOGEN UA: 0.2

## 2016-07-02 LAB — POC MICROSCOPIC URINALYSIS (UMFC): MUCUS RE: ABSENT

## 2016-07-02 MED ORDER — NITROFURANTOIN MONOHYD MACRO 100 MG PO CAPS: 100.0000 mg | ORAL_CAPSULE | Freq: Two times a day (BID) | ORAL | 0 refills | Status: AC

## 2016-07-02 NOTE — Patient Instructions (Addendum)
I will call you with your lab results.  Diabetes Mellitus and Food It is important for you to manage your blood sugar (glucose) level. Your blood glucose level can be greatly affected by what you eat. Eating healthier foods in the appropriate amounts throughout the day at about the same time each day will help you control your blood glucose level. It can also help slow or prevent worsening of your diabetes mellitus. Healthy eating may even help you improve the level of your blood pressure and reach or maintain a healthy weight.  General recommendations for healthful eating and cooking habits include:  Eating meals and snacks regularly. Avoid going long periods of time without eating to lose weight.  Eating a diet that consists mainly of plant-based foods, such as fruits, vegetables, nuts, legumes, and whole grains.  Using low-heat cooking methods, such as baking, instead of high-heat cooking methods, such as deep frying. Work with your dietitian to make sure you understand how to use the Nutrition Facts information on food labels. HOW CAN FOOD AFFECT ME? Carbohydrates Carbohydrates affect your blood glucose level more than any other type of food. Your dietitian will help you determine how many carbohydrates to eat at each meal and teach you how to count carbohydrates. Counting carbohydrates is important to keep your blood glucose at a healthy level, especially if you are using insulin or taking certain medicines for diabetes mellitus. Alcohol Alcohol can cause sudden decreases in blood glucose (hypoglycemia), especially if you use insulin or take certain medicines for diabetes mellitus. Hypoglycemia can be a life-threatening condition. Symptoms of hypoglycemia (sleepiness, dizziness, and disorientation) are similar to symptoms of having too much alcohol.  If your health care provider has given you approval to drink alcohol, do so in moderation and use the following guidelines:  Women should not  have more than one drink per day, and men should not have more than two drinks per day. One drink is equal to:  12 oz of beer.  5 oz of wine.  1 oz of hard liquor.  Do not drink on an empty stomach.  Keep yourself hydrated. Have water, diet soda, or unsweetened iced tea.  Regular soda, juice, and other mixers might contain a lot of carbohydrates and should be counted. WHAT FOODS ARE NOT RECOMMENDED? As you make food choices, it is important to remember that all foods are not the same. Some foods have fewer nutrients per serving than other foods, even though they might have the same number of calories or carbohydrates. It is difficult to get your body what it needs when you eat foods with fewer nutrients. Examples of foods that you should avoid that are high in calories and carbohydrates but low in nutrients include:  Trans fats (most processed foods list trans fats on the Nutrition Facts label).  Regular soda.  Juice.  Candy.  Sweets, such as cake, pie, doughnuts, and cookies.  Fried foods. WHAT FOODS CAN I EAT? Eat nutrient-rich foods, which will nourish your body and keep you healthy. The food you should eat also will depend on several factors, including:  The calories you need.  The medicines you take.  Your weight.  Your blood glucose level.  Your blood pressure level.  Your cholesterol level. You should eat a variety of foods, including:  Protein.  Lean cuts of meat.  Proteins low in saturated fats, such as fish, egg whites, and beans. Avoid processed meats.  Fruits and vegetables.  Fruits and vegetables that may help  control blood glucose levels, such as apples, mangoes, and yams.  Dairy products.  Choose fat-free or low-fat dairy products, such as milk, yogurt, and cheese.  Grains, bread, pasta, and rice.  Choose whole grain products, such as multigrain bread, whole oats, and brown rice. These foods may help control blood pressure.  Fats.  Foods  containing healthful fats, such as nuts, avocado, olive oil, canola oil, and fish. DOES EVERYONE WITH DIABETES MELLITUS HAVE THE SAME MEAL PLAN? Because every person with diabetes mellitus is different, there is not one meal plan that works for everyone. It is very important that you meet with a dietitian who will help you create a meal plan that is just right for you.   This information is not intended to replace advice given to you by your health care provider. Make sure you discuss any questions you have with your health care provider.   Document Released: 06/25/2005 Document Revised: 10/19/2014 Document Reviewed: 08/25/2013 Elsevier Interactive Patient Education 2016 Elsevier Inc.   Heart-Healthy Eating Plan Heart-healthy meal planning includes:  Limiting unhealthy fats.  Increasing healthy fats.  Making other small dietary changes. You may need to talk with your doctor or a diet specialist (dietitian) to create an eating plan that is right for you. WHAT TYPES OF FAT SHOULD I CHOOSE?  Choose healthy fats. These include olive oil and canola oil, flaxseeds, walnuts, almonds, and seeds.  Eat more omega-3 fats. These include salmon, mackerel, sardines, tuna, flaxseed oil, and ground flaxseeds. Try to eat fish at least twice each week.  Limit saturated fats.  Saturated fats are often found in animal products, such as meats, butter, and cream.  Plant sources of saturated fats include palm oil, palm kernel oil, and coconut oil.  Avoid foods with partially hydrogenated oils in them. These include stick margarine, some tub margarines, cookies, crackers, and other baked goods. These contain trans fats. WHAT GENERAL GUIDELINES DO I NEED TO FOLLOW?  Check food labels carefully. Identify foods with trans fats or high amounts of saturated fat.  Fill one half of your plate with vegetables and green salads. Eat 4-5 servings of vegetables per day. A serving of vegetables is:  1 cup of raw  leafy vegetables.   cup of raw or cooked cut-up vegetables.   cup of vegetable juice.  Fill one fourth of your plate with whole grains. Look for the word "whole" as the first word in the ingredient list.  Fill one fourth of your plate with lean protein foods.  Eat 4-5 servings of fruit per day. A serving of fruit is:  One medium whole fruit.   cup of dried fruit.   cup of fresh, frozen, or canned fruit.   cup of 100% fruit juice.  Eat more foods that contain soluble fiber. These include apples, broccoli, carrots, beans, peas, and barley. Try to get 20-30 g of fiber per day.  Eat more home-cooked food. Eat less restaurant, buffet, and fast food.  Limit or avoid alcohol.  Limit foods high in starch and sugar.  Avoid fried foods.  Avoid frying your food. Try baking, boiling, grilling, or broiling it instead. You can also reduce fat by:  Removing the skin from poultry.  Removing all visible fats from meats.  Skimming the fat off of stews, soups, and gravies before serving them.  Steaming vegetables in water or broth.  Lose weight if you are overweight.  Eat 4-5 servings of nuts, legumes, and seeds per week:  One serving of  dried beans or legumes equals  cup after being cooked.  One serving of nuts equals 1 ounces.  One serving of seeds equals  ounce or one tablespoon.  You may need to keep track of how much salt or sodium you eat. This is especially true if you have high blood pressure. Talk with your doctor or dietitian to get more information. WHAT FOODS CAN I EAT? Grains Breads, including [place], white, pita, wheat, raisin, rye, oatmeal, and [place]. Tortillas that are neither fried nor made with lard or trans fat. Low-fat rolls, including hotdog and hamburger buns and English muffins. Biscuits. Muffins. Waffles. Pancakes. Light popcorn. Whole-grain cereals. Flatbread. Melba toast. Pretzels. Breadsticks. Rusks. Low-fat snacks. Low-fat crackers, including  oyster, saltine, matzo, graham, animal, and rye. Rice and pasta, including brown rice and pastas that are made with whole wheat.  Vegetables All vegetables.  Fruits All fruits, but limit coconut. Meats and Other Protein Sources Lean, well-trimmed beef, veal, pork, and lamb. Chicken and [place] without skin. All fish and shellfish. Wild duck, rabbit, pheasant, and venison. Egg whites or low-cholesterol egg substitutes. Dried beans, peas, lentils, and tofu. Seeds and most nuts. Dairy Low-fat or nonfat cheeses, including ricotta, string, and mozzarella. Skim or 1% milk that is liquid, powdered, or evaporated. Buttermilk that is made with low-fat milk. Nonfat or low-fat yogurt. Beverages Mineral water. Diet carbonated beverages. Sweets and Desserts Sherbets and fruit ices. Honey, jam, marmalade, jelly, and syrups. Meringues and gelatins. Pure sugar candy, such as hard candy, jelly beans, gumdrops, mints, marshmallows, and small amounts of dark chocolate. [employer] Eat all sweets and desserts in moderation. Fats and Oils Nonhydrogenated (trans-free) margarines. Vegetable oils, including soybean, sesame, sunflower, olive, peanut, safflower, corn, canola, and cottonseed. Salad dressings or mayonnaise made with a vegetable oil. Limit added fats and oils that you use for cooking, baking, salads, and as spreads. Other Cocoa powder. Coffee and tea. All seasonings and condiments. The items listed above may not be a complete list of recommended foods or beverages. Contact your dietitian for more options. WHAT FOODS ARE NOT RECOMMENDED? Grains Breads that are made with saturated or trans fats, oils, or whole milk. Croissants. Butter rolls. Cheese breads. Sweet rolls. Donuts. Buttered popcorn. Chow mein noodles. High-fat crackers, such as cheese or butter crackers. Meats and Other Protein Sources Fatty meats, such as hotdogs, short ribs, sausage, spareribs, bacon, rib eye roast or steak, and mutton.  High-fat deli meats, such as salami and bologna. Caviar. Domestic duck and goose. Organ meats, such as kidney, liver, sweetbreads, and heart. Dairy Cream, sour cream, cream cheese, and creamed cottage cheese. Whole-milk cheeses, including blue (bleu), Monte[place] Jack, [place], [place], [place], [place], Swiss, cheddar, [place], and [place]. Whole or 2% milk that is liquid, evaporated, or condensed. Whole buttermilk. Cream sauce or high-fat cheese sauce. Yogurt that is made from whole milk. Beverages Regular sodas and juice drinks with added sugar. Sweets and Desserts Frosting. Pudding. Cookies. Cakes other than angel food cake. Candy that has milk chocolate or white chocolate, hydrogenated fat, butter, coconut, or unknown ingredients. Buttered syrups. Full-fat ice cream or ice cream drinks. Fats and Oils Gravy that has suet, meat fat, or shortening. Cocoa butter, hydrogenated oils, palm oil, coconut oil, palm kernel oil. These can often be found in baked products, candy, fried foods, nondairy creamers, and whipped toppings. Solid fats and shortenings, including bacon fat, salt pork, lard, and butter. Nondairy cream substitutes, such as coffee creamers and sour cream substitutes. Salad dressings that are made  of unknown oils, cheese, or sour cream. The items listed above may not be a complete list of foods and beverages to avoid. Contact your dietitian for more information.   This information is not intended to replace advice given to you by your health care provider. Make sure you discuss any questions you have with your health care provider.   Document Released: 03/29/2012 Document Revised: 10/19/2014 Document Reviewed: 03/22/2014 Elsevier Interactive Patient Education [employer].    IF you received an x-ray today, you will receive an invoice from [facility]. Please contact [facility] at [phone] with questions or concerns regarding your invoice.   IF you received  labwork today, you will receive an invoice from [employer]. Please contact [facility] at [phone] with questions or concerns regarding your invoice.   Our billing staff will not be able to assist you with questions regarding bills from these companies.  You will be contacted with the lab results as soon as they are available. The fastest way to get your results is to activate your My Chart account. Instructions are located on the last page of this paperwork. If you have not heard from [place] regarding the results in 2 weeks, please contact this office.

## 2016-07-02 NOTE — Progress Notes (Signed)
Rachael Garcia  MRN: [id number] DOB: 10/26/1955  Subjective:  Pt is a 60 y.o. female who presents for annual physical exam. She is about to start a diabetic nutrition program for work and wants to be evaluated before starting. Pt has history of elevated [name] x 3 years but has never wanted to start taking medication. She reports having it checked last week by the nutrition program and it was 7.2. They recommended that she have it rechecked in office.   Pt is not fasting today.    Exercise: Walks 3-4 times a week for 30 minutes at a time.   Diet: She eats potatoes, whole oat toast, fried food (tries to limit this), vegetables (lots of greens), chicken, fish, and minimal fruit.  Eats three meals a day. Drinks water all day. Limits ginger ale and coffee intake. Pt likes sweets but doesn't have to have one daily. One of her bigger issues is portion control.   Sleep: Gets about 7 hours a night. Sometimes has difficulty falling asleep but once she does she sleeps well. Does note that she likes to browse the Internet on her phone prior to bed.   Hypertension: Checks bp occasionally. Notes that it is typically in the 120s. She had an incident before coming in today that upset so she believes that this is why it is high in office.   Last dental exam: 06/2015 Last vision exam: 06/2015 Last pap smear: 09/17/2015 Last mammogram: 10/03/2015 Last colonoscopy: 11/16/2014 Vaccinations      Tetanus: 06/30/2016       Patient Active Problem List   Diagnosis Date Noted  . Hyperlipidemia 08/14/2010  . ANXIETY 08/14/2010  . Essential hypertension 08/14/2010  . RUQ PAIN 08/14/2010  . LACTOSE INTOLERANCE 08/13/2010  . SICKLE CELL TRAIT 08/13/2010  . GERD 08/13/2010  . IRRITABLE BOWEL SYNDROME 08/13/2010  . Allergic rhinitis 04/04/2007  . ASTHMA 04/04/2007    Current Outpatient Prescriptions on File Prior to Visit  Medication Sig Dispense Refill  . albuterol (PROVENTIL HFA;VENTOLIN HFA) 108 (90 BASE)  MCG/ACT inhaler Inhale 2 puffs into the lungs every 6 (six) hours as needed for wheezing or shortness of breath. 8 g 1  . amLODipine (NORVASC) 10 MG tablet Take 1 tablet (10 mg total) by mouth daily. 90 tablet 3  . fluticasone (FLONASE) 50 MCG/ACT nasal spray 2 sprays by Each Nare route daily.    Marland Kitchen loratadine (CLARITIN) 10 MG tablet take 1 tablet by mouth once daily 30 tablet 2  . Specialty Vitamins Products (MAGNESIUM, AMINO ACID CHELATE,) 133 MG tablet Take by mouth.    . benzonatate (TESSALON) 100 MG capsule Take 1-2 capsules (100-200 mg total) by mouth 3 (three) times daily as needed for cough. 40 capsule 0  . cetirizine (ZYRTEC) 10 MG tablet Take 1 tablet (10 mg total) by mouth daily. (Patient not taking: Reported on 07/02/2016) 30 tablet 11  . promethazine-dextromethorphan (PROMETHAZINE-DM) 6.25-15 MG/5ML syrup Take 5 mLs by mouth 4 (four) times daily as needed for cough. (Patient not taking: Reported on 07/02/2016) 118 mL 0   No current facility-administered medications on file prior to visit.     Allergies  Allergen Reactions  . Codeine     Hives   . Erythromycin     hives  . Penicillins     hives  . Sulfonamide Derivatives     swelling    Social History   Social History  . Marital status: Widowed    Spouse name: N/A  .  Number of children: 2  . Years of education: N/A   Occupational History  . insurance agent/broker [place] History Main Topics  . Smoking status: Former Smoker    Years: 8.00    Types: Cigarettes    Quit date: 07/13/2011  . Smokeless tobacco: Never Used  . Alcohol use 0.0 oz/week     Comment: OCCASIONALLY WINE  . Drug use: No  . Sexual activity: Not Currently    Birth control/ protection: Post-menopausal   Other Topics Concern  . None   Social History Narrative  . None    Past Surgical History:  Procedure Laterality Date  . CARPAL TUNNEL RELEASE Right 03  . CARPAL TUNNEL RELEASE  08/20/2011    Procedure: CARPAL TUNNEL RELEASE;  Surgeon: Cammie Sickle., MD;  Location: [place];  Service: Orthopedics;  Laterality: Left;  . CESAREAN SECTION  83,92   x 2  . COLONOSCOPY    . DIAGNOSTIC LAPAROSCOPY     ovarian cyst removal  . DILATION AND CURETTAGE OF UTERUS    . MASS EXCISION Right 07/03/2015   Procedure: EXCISION RIGHT LONG FINGER MASS;  Surgeon: Charlotte Crumb, MD;  Location: [place];  Service: Orthopedics;  Laterality: Right;  . TUBAL LIGATION      Family History  Problem Relation Age of Onset  . Arthritis Mother   . Anemia Mother   . Asthma Mother   . Diabetes Sister   . Hypertension Father   . Alcohol abuse Father   . Heart disease Father 54    defibrillator and pacemaker  . Hyperlipidemia Father   . Stomach cancer Paternal Grandmother   . Multiple myeloma Maternal Grandmother   . Anesthesia problems Sister   . Diabetes Brother     Review of Systems  Constitutional: Positive for diaphoresis and fatigue. Negative for activity change, appetite change, chills, fever and unexpected weight change.  HENT: Positive for congestion, hearing loss, postnasal drip and trouble swallowing. Negative for dental problem, drooling, ear discharge, ear pain, facial swelling, mouth sores, nosebleeds, rhinorrhea, sinus pressure, sneezing, sore throat, tinnitus and voice change.   Eyes: Positive for itching and visual disturbance. Negative for photophobia, pain, discharge and redness.  Respiratory: Negative.   Cardiovascular: Negative.   Gastrointestinal: Negative.   Endocrine: Positive for heat intolerance and polyphagia. Negative for cold intolerance, polydipsia and polyuria.  Genitourinary: Positive for urgency (for the past few days). Negative for decreased urine volume, difficulty urinating, dyspareunia, dysuria, enuresis, flank pain, frequency, genital sores, hematuria, menstrual problem, pelvic pain, vaginal bleeding, vaginal discharge and  vaginal pain.  Musculoskeletal: Positive for arthralgias, back pain and gait problem. Negative for joint swelling, myalgias, neck pain and neck stiffness.  Skin: Negative.   Allergic/Immunologic: Positive for environmental allergies. Negative for food allergies and immunocompromised state.  Neurological: Positive for dizziness. Negative for tremors, seizures, syncope, facial asymmetry, speech difficulty, weakness, light-headedness, numbness and headaches.  Hematological: Negative for adenopathy. Bruises/bleeds easily.  Psychiatric/Behavioral: Negative for agitation, behavioral problems, confusion, decreased concentration, dysphoric mood, hallucinations, self-injury, sleep disturbance and suicidal ideas. The patient is nervous/anxious. The patient is not hyperactive.    Objective:  BP 134/80   Pulse 65   Temp 98.3 F (36.8 C) (Oral)   Resp 16   Ht _0  (1.651 m)   Wt 198 lb 6.4 oz (90 kg)   SpO2 98%   BMI 33.02 kg/m   Physical Exam  Constitutional: She is oriented  to person, place, and time and well-developed, well-nourished, and in no distress.  HENT:  Head: Normocephalic and atraumatic.  Right Ear: Hearing, tympanic membrane, external ear and ear canal normal.  Left Ear: Hearing, tympanic membrane, external ear and ear canal normal.  Nose: Mucosal edema present.  Mouth/Throat: Uvula is midline, oropharynx is clear and moist and mucous membranes are normal. No oropharyngeal exudate.  Eyes: Conjunctivae, EOM and lids are normal. Pupils are equal, round, and reactive to light. No scleral icterus.  Neck: Trachea normal and normal range of motion. No thyroid mass and no thyromegaly present.  Cardiovascular: Normal rate, regular rhythm, normal heart sounds and intact distal pulses.   Pulmonary/Chest: Effort normal and breath sounds normal.  Abdominal: Soft. Normal appearance and bowel sounds are normal. There is no tenderness.  Lymphadenopathy:       Head (right side): No tonsillar, no  preauricular, no posterior auricular and no occipital adenopathy present.       Head (left side): No tonsillar, no preauricular, no posterior auricular and no occipital adenopathy present.    She has no cervical adenopathy.       Right: No supraclavicular adenopathy present.       Left: No supraclavicular adenopathy present.  Neurological: She is alert and oriented to person, place, and time. She has normal sensation, normal strength and normal reflexes. Gait normal.  Skin: Skin is warm and dry.  Psychiatric: Affect normal.   Diabetic Foot Exam - Simple   Simple Foot Form Diabetic Foot exam was performed with the following findings:  Yes 07/02/2016  5:23 PM  Visual Inspection No deformities, no ulcerations, no other skin breakdown bilaterally:  Yes Sensation Testing Intact to touch and monofilament testing bilaterally:  Yes Pulse Check Posterior Tibialis and Dorsalis pulse intact bilaterally:  Yes Comments     No exam data present  EKG shows sinus rhythm with negative T wave in lead III. Findings consistent with previous EKG in 2015. EKG findings presented to Dr. Carlota Raspberry.    Results for orders placed or performed in visit on 07/02/16 (from the past 24 hour(s))  POCT Microscopic Urinalysis ([facility])     Status: Abnormal   Collection Time: 07/02/16  7:26 PM  Result Value Ref Range   WBC,UR,HPF,POC Few (A) None WBC/hpf   RBC,UR,HPF,POC None None RBC/hpf   Bacteria None None, Too numerous to count   Mucus Absent Absent   Epithelial Cells, UR Per Microscopy None None, Too numerous to count cells/hpf  POCT urinalysis dipstick     Status: Abnormal   Collection Time: 07/02/16  7:26 PM  Result Value Ref Range   Color, UA yellow yellow   Clarity, UA clear clear   Glucose, UA negative negative   Bilirubin, UA negative negative   Ketones, POC UA negative negative   Spec Grav, UA 1.010    Blood, UA negative negative   pH, UA 6.0    Protein Ur, POC negative negative   Urobilinogen, UA 0.2     Nitrite, UA Negative Negative   Leukocytes, UA small (1+) (A) Negative    Assessment and Plan :  Discussed healthy lifestyle, diet, exercise, preventative care, vaccinations, and addressed patient's concerns. Plan for follow up in 3 months. Otherwise, plan for specific conditions below.  1. Annual physical exam -Await lab results  2. Other fatigue - TSH - CBC with Differential/Platelet  3. Type 2 diabetes mellitus without complication, without long-term current use of insulin (HCC) -Pt has agreed to being placed on  metformin once [name] results return, will also initiate a statin after lipid panel next week, she wants to wait being started on ACEI at this time - Microalbumin, urine - POCT Microscopic Urinalysis ([facility]) - POCT urinalysis dipstick - Hemoglobin [name] - COMPLETE METABOLIC PANEL WITH GFR - HM Diabetes Foot Exam  4. Hyperlipidemia -Initiate statin once labs return  - Lipid panel; Future  5. Need for hepatitis C screening test - Hepatitis C antibody  6. Screening for HIV (human immunodeficiency virus) - HIV antibody  7. Essential hypertension - EKG 12-Lead  8. Urinary urgency - Urine culture  9. UTI (lower urinary tract infection) - nitrofurantoin, macrocrystal-monohydrate, (MACROBID) 100 MG capsule; Take 1 capsule (100 mg total) by mouth 2 (two) times daily.  Dispense: 10 capsule; Refill: 0   Tenna Delaine PA-C  [facility] 07/02/2016 10:33 PM

## 2016-07-03 LAB — COMPLETE METABOLIC PANEL WITH GFR
ALT: 15 U/L (ref 6–29)
AST: 22 U/L (ref 10–35)
Albumin: 4.3 g/dL (ref 3.6–5.1)
Alkaline Phosphatase: 49 U/L (ref 33–130)
BUN: 13 mg/dL (ref 7–25)
CALCIUM: 9.4 mg/dL (ref 8.6–10.4)
CHLORIDE: 104 mmol/L (ref 98–110)
CO2: 27 mmol/L (ref 20–31)
CREATININE: 0.81 mg/dL (ref 0.50–0.99)
GFR, Est Non African American: 79 mL/min (ref >=60)
Glucose, Bld: 82 mg/dL (ref 65–99)
POTASSIUM: 4.3 mmol/L (ref 3.5–5.3)
Sodium: 140 mmol/L (ref 135–146)
Total Bilirubin: 0.5 mg/dL (ref 0.2–1.2)
Total Protein: 6.8 g/dL (ref 6.1–8.1)

## 2016-07-03 LAB — MICROALBUMIN, URINE: MICROALB UR: 0.9 mg/dL

## 2016-07-05 LAB — URINE CULTURE

## 2016-07-07 ENCOUNTER — Other Ambulatory Visit: Payer: Self-pay | Admitting: Physician Assistant

## 2016-07-07 NOTE — Progress Notes (Signed)
I have contacted patient and let her know her culture grew out an organism not covered by the initial antibiotic placed on for UTI. She informed me that she did not pick up this antibiotic in the first place. She has penicillins listed as her allergy in EPIC. When asked about this, she states she had mild hives to penicillin a long time ago, no anaphylaxis. She also told me that she has used cephalosporins in the past, specifically cephalexin, without any side effects or reactions. We will use keflex for UTI. Pt understands that if she has any reaction to discontinue the medication.

## 2016-07-08 ENCOUNTER — Other Ambulatory Visit: Payer: Self-pay | Admitting: *Deleted

## 2016-07-08 DIAGNOSIS — Z1159 Encounter for screening for other viral diseases: Secondary | ICD-10-CM

## 2016-07-08 DIAGNOSIS — E785 Hyperlipidemia, unspecified: Secondary | ICD-10-CM

## 2016-07-08 DIAGNOSIS — Z114 Encounter for screening for human immunodeficiency virus [HIV]: Secondary | ICD-10-CM

## 2016-07-08 DIAGNOSIS — E119 Type 2 diabetes mellitus without complications: Secondary | ICD-10-CM

## 2016-07-08 DIAGNOSIS — R5383 Other fatigue: Secondary | ICD-10-CM

## 2016-07-08 MED ORDER — CEPHALEXIN 500 MG PO CAPS: 500.0000 mg | ORAL_CAPSULE | Freq: Two times a day (BID) | ORAL | 0 refills | Status: AC

## 2016-07-09 ENCOUNTER — Other Ambulatory Visit (INDEPENDENT_AMBULATORY_CARE_PROVIDER_SITE_OTHER): Payer: BLUE CROSS/BLUE SHIELD | Admitting: Physician Assistant

## 2016-07-09 DIAGNOSIS — R5383 Other fatigue: Secondary | ICD-10-CM

## 2016-07-09 DIAGNOSIS — Z114 Encounter for screening for human immunodeficiency virus [HIV]: Secondary | ICD-10-CM

## 2016-07-09 DIAGNOSIS — Z1159 Encounter for screening for other viral diseases: Secondary | ICD-10-CM

## 2016-07-09 DIAGNOSIS — E785 Hyperlipidemia, unspecified: Secondary | ICD-10-CM

## 2016-07-09 DIAGNOSIS — E119 Type 2 diabetes mellitus without complications: Secondary | ICD-10-CM

## 2016-07-09 NOTE — Progress Notes (Signed)
I did not personally examine this patient.  

## 2016-07-10 LAB — LIPID PANEL
CHOL/HDL RATIO: 3.8 ratio (ref 0.0–4.4)
Cholesterol, Total: 254 mg/dL — ABNORMAL HIGH (ref 100–199)
HDL: 66 mg/dL (ref >39)
LDL CALC: 155 mg/dL — AB (ref 0–99)
TRIGLYCERIDES: 165 mg/dL — AB (ref 0–149)
VLDL CHOLESTEROL CAL: 33 mg/dL (ref 5–40)

## 2016-07-10 LAB — CBC WITH DIFFERENTIAL/PLATELET
Basophils Absolute: 0.1 10*3/uL (ref 0.0–0.2)
Basos: 1 %
EOS (ABSOLUTE): 0.2 10*3/uL (ref 0.0–0.4)
EOS: 3 %
HEMATOCRIT: 38.5 % (ref 34.0–46.6)
HEMOGLOBIN: 12.7 g/dL (ref 11.1–15.9)
Immature Grans (Abs): 0 10*3/uL (ref 0.0–0.1)
Immature Granulocytes: 0 %
LYMPHS ABS: 2.2 10*3/uL (ref 0.7–3.1)
Lymphs: 34 %
MCH: 27.7 pg (ref 26.6–33.0)
MCHC: 33 g/dL (ref 31.5–35.7)
MCV: 84 fL (ref 79–97)
MONOCYTES: 9 %
MONOS ABS: 0.6 10*3/uL (ref 0.1–0.9)
NEUTROS ABS: 3.4 10*3/uL (ref 1.4–7.0)
Neutrophils: 53 %
Platelets: 243 10*3/uL (ref 150–379)
RBC: 4.58 x10E6/uL (ref 3.77–5.28)
RDW: 14.9 % (ref 12.3–15.4)
WBC: 6.4 10*3/uL (ref 3.4–10.8)

## 2016-07-10 LAB — HEMOGLOBIN A1C
Est. average glucose Bld gHb Est-mCnc: 140 mg/dL
Hgb A1c MFr Bld: 6.5 % — ABNORMAL HIGH (ref 4.8–5.6)

## 2016-07-10 LAB — TSH: TSH: 2.06 u[IU]/mL (ref 0.450–4.500)

## 2016-07-10 LAB — HEPATITIS C ANTIBODY: Hep C Virus Ab: <0.1 s/co ratio (ref 0.0–0.9)

## 2016-07-10 LAB — HIV ANTIBODY (ROUTINE TESTING W REFLEX): HIV Screen 4th Generation wRfx: NONREACTIVE

## 2016-07-14 ENCOUNTER — Other Ambulatory Visit: Payer: Self-pay | Admitting: Physician Assistant

## 2016-07-14 ENCOUNTER — Telehealth: Payer: Self-pay | Admitting: Physician Assistant

## 2016-07-14 DIAGNOSIS — E785 Hyperlipidemia, unspecified: Secondary | ICD-10-CM

## 2016-07-14 MED ORDER — ATORVASTATIN CALCIUM 20 MG PO TABS: 20.0000 mg | ORAL_TABLET | Freq: Every day | ORAL | 0 refills | Status: DC

## 2016-07-14 NOTE — Progress Notes (Signed)
Pt called and lab results were discussed. [name] was 6.5, down from 7.0 ten months ago, with no medication and dietary/lifestyle changes. Pt is very adamant about not starting diabetes medication. She has just enrolled in a 14 week diabetes education/nutrition course with her work and states she has already noticed a difference. We discussed the option of her completing this course then returning to clinic for repeat [name]. At this time if [name] still elevated >6.4, we will initiate diabetes medication. Pt also informed of elevated cholesterol. Considering her values have increased since last year, we will initiate a statin. She is to follow up in clinic in three months for repeat labs and reevaluation. Pt understands and agrees with treatment plan.

## 2016-07-14 NOTE — Telephone Encounter (Signed)
Pt called, no answer. Left message to call me back so we can discuss lab results.

## 2016-09-02 ENCOUNTER — Other Ambulatory Visit: Payer: Self-pay | Admitting: Family Medicine

## 2016-09-14 ENCOUNTER — Telehealth: Payer: Self-pay

## 2016-09-14 ENCOUNTER — Other Ambulatory Visit: Payer: Self-pay | Admitting: Physician Assistant

## 2016-09-14 DIAGNOSIS — Z1231 Encounter for screening mammogram for malignant neoplasm of breast: Secondary | ICD-10-CM

## 2016-09-14 NOTE — Telephone Encounter (Signed)
Patient is calling because we sent her labs to the wrong place and they were supposed to have gone to [facility]. Patient states that she spoke to someone who said that we will take care of the bill but she can't remember who it was. Patient is still getting bills in the mail and would like to speak with someone within the next 48 hours.  [phone]

## 2016-09-15 ENCOUNTER — Ambulatory Visit (INDEPENDENT_AMBULATORY_CARE_PROVIDER_SITE_OTHER): Payer: BLUE CROSS/BLUE SHIELD

## 2016-09-15 ENCOUNTER — Ambulatory Visit (INDEPENDENT_AMBULATORY_CARE_PROVIDER_SITE_OTHER): Payer: BLUE CROSS/BLUE SHIELD | Admitting: Urgent Care

## 2016-09-15 VITALS — BP 131/80 | HR 76 | Temp 98.6°F | Resp 16 | Ht 65.0 in | Wt 193.2 lb

## 2016-09-15 DIAGNOSIS — M5030 Other cervical disc degeneration, unspecified cervical region: Secondary | ICD-10-CM

## 2016-09-15 DIAGNOSIS — M549 Dorsalgia, unspecified: Secondary | ICD-10-CM | POA: Diagnosis not present

## 2016-09-15 DIAGNOSIS — M5134 Other intervertebral disc degeneration, thoracic region: Secondary | ICD-10-CM | POA: Diagnosis not present

## 2016-09-15 LAB — POCT URINALYSIS DIP (MANUAL ENTRY)
Bilirubin, UA: NEGATIVE
Blood, UA: NEGATIVE
GLUCOSE UA: NEGATIVE
Ketones, POC UA: NEGATIVE
NITRITE UA: NEGATIVE
Protein Ur, POC: NEGATIVE
Spec Grav, UA: 1.01
UROBILINOGEN UA: 0.2
pH, UA: 5.5

## 2016-09-15 LAB — POC MICROSCOPIC URINALYSIS (UMFC): MUCUS RE: ABSENT

## 2016-09-15 NOTE — Telephone Encounter (Signed)
Given to Fabens to handle

## 2016-09-15 NOTE — Patient Instructions (Addendum)
Schedule Tylenol every 6 hours at a dose of 500mg . Use Anaprox 550mg  every 12 hours as needed for break through pain and make sure that you take this with food.  Degenerative Disk Disease Introduction Degenerative disk disease is a condition caused by the changes that occur in spinal disks as you grow older. Spinal disks are soft and compressible disks located between the bones of your spine (vertebrae). These disks act like shock absorbers. Degenerative disk disease can affect the whole spine. However, the neck and lower back are most commonly affected. Many changes can occur in the spinal disks with aging, such as:  The spinal disks may dry and shrink.  Small tears may occur in the tough, outer covering of the disk (annulus).  The disk space may become smaller due to loss of water.  Abnormal growths in the bone (spurs) may occur. This can put pressure on the nerve roots exiting the spinal canal, causing pain.  The spinal canal may become narrowed. What increases the risk?  Being overweight.  Having a family history of degenerative disk disease.  Smoking.  There is increased risk if you are doing heavy lifting or have a sudden injury. What are the signs or symptoms? Symptoms vary from person to person and may include:  Pain that varies in intensity. Some people have no pain, while others have severe pain. The location of the pain depends on the part of your backbone that is affected.  You will have neck or arm pain if a disk in the neck area is affected.  You will have pain in your back, buttocks, or legs if a disk in the lower back is affected.  Pain that becomes worse while bending, reaching up, or with twisting movements.  Pain that may start gradually and then get worse as time passes. It may also start after a major or minor injury.  Numbness or tingling in the arms or legs. How is this diagnosed? Your health care provider will ask you about your symptoms and about  activities or habits that may cause the pain. He or she may also ask about any injuries, diseases, or treatments you have had. Your health care provider will examine you to check for the range of movement that is possible in the affected area, to check for strength in your extremities, and to check for sensation in the areas of the arms and legs supplied by different nerve roots. You may also have:  An X-ray of the spine.  Other imaging tests, such as MRI. How is this treated? Your health care provider will advise you on the best plan for treatment. Treatment may include:  Medicines.  Rehabilitation exercises. Follow these instructions at home:  Follow proper lifting and walking techniques as advised by your health care provider.  Maintain good posture.  Exercise regularly as advised by your health care provider.  Perform relaxation exercises.  Change your sitting, standing, and sleeping habits as advised by your health care provider.  Change positions frequently.  Lose weight or maintain a healthy weight as advised by your health care provider.  Do not use any tobacco products, including cigarettes, chewing tobacco, or electronic cigarettes. If you need help quitting, ask your health care provider.  Wear supportive footwear.  Take medicines only as directed by your health care provider. Contact a health care provider if:  Your pain does not go away within 1-4 weeks.  You have significant appetite or weight loss. Get help right away if:  Your pain is severe.  You notice weakness in your arms, hands, or legs.  You begin to lose control of your bladder or bowel movements.  You have fevers or night sweats. This information is not intended to replace advice given to you by your health care provider. Make sure you discuss any questions you have with your health care provider. Document Released: 07/26/2007 Document Revised: 03/05/2016 Document Reviewed: 01/30/2014  2017  Elsevier   IF you received an x-ray today, you will receive an invoice from [facility]. Please contact [facility] at [phone] with questions or concerns regarding your invoice.   IF you received labwork today, you will receive an invoice from [employer]. Please contact [facility] at [phone] with questions or concerns regarding your invoice.   Our billing staff will not be able to assist you with questions regarding bills from these companies.  You will be contacted with the lab results as soon as they are available. The fastest way to get your results is to activate your My Chart account. Instructions are located on the last page of this paperwork. If you have not heard from [place] regarding the results in 2 weeks, please contact this office.

## 2016-09-15 NOTE — Progress Notes (Signed)
MRN: [id number] DOB: [birth year]  Subjective:   Rachael Garcia is a 60 y.o. female presenting for chief complaint of Back Pain (Dx DDD some years ago - [facility] - Need a handicap sticker)  Reports a history of intermittent severe back pain between her neck and mid back. She has difficulty ambulating, has to turn in very specific ways to even get out of the car. Has had back pain for the past 6 weeks after she started working at a job that has her standing for the entirety of her work shift, does not do heavy lifting. She did quit this job, would like a temporary handicap placard to help her while she is having severe back pain. Has tried Tylenol Extra Strength with some relief. Previously used Anaprox, Tramadol. Anaprox helps but Tramadol doesn't. She gets hives with codeine. States that she was diagnosed with DDD. CT of the neck confirms moderate DDD between C3-C6. Was seen by neurologist ~6 years ago, recommended surgery which she declined and has been managing her pain with medical therapy alone. She has previously completed physical therapy, last sessions were in 2013. Denies trauma, car accidents, falls, radiculopathy. However, she does admit having radiculopathy in the past.    Rachael Garcia has a current medication list which includes the following prescription(s): amlodipine, fluticasone, loratadine, and promethazine-dextromethorphan. Also is allergic to codeine; erythromycin; penicillins; and sulfonamide derivatives.  Rachael Garcia  has a past medical history of Allergy; Anemia; Anxiety; Arthritis; Blood dyscrasia; Complication of anesthesia (not sure); GERD (gastroesophageal reflux disease); Heart murmur; Hypertension; IBS (irritable bowel syndrome); and Sickle cell anemia ([facility]). Also  has a past surgical history that includes Tubal ligation; Dilation and curettage of uterus; Colonoscopy; Diagnostic laparoscopy; Cesarean section [id number]); Carpal tunnel release (Right, 03); Carpal tunnel release (08/20/2011); and  Mass excision (Right, 07/03/2015).   Objective:   Vitals: BP 131/80 (BP Location: Right Arm, Patient Position: Sitting, Cuff Size: Large)   Pulse 76   Temp 98.6 F (37 C) (Oral)   Resp 16   Ht 5\' 5"  (1.651 m)   Wt 193 lb 3.2 oz (87.6 kg)   SpO2 99%   BMI 32.15 kg/m   Physical Exam  Constitutional: She is oriented to person, place, and time. She appears well-developed and well-nourished.  Cardiovascular: Normal rate.   Pulmonary/Chest: Effort normal.  Musculoskeletal:       Cervical back: She exhibits decreased range of motion (flexion, lateral rotation to the left). She exhibits no tenderness, no bony tenderness, no swelling, no edema, no deformity and no spasm.       Thoracic back: She exhibits decreased range of motion (flexion and extension) and tenderness (over area depicted). She exhibits no bony tenderness, no swelling, no edema, no deformity and no spasm.       Lumbar back: She exhibits normal range of motion, no tenderness, no bony tenderness, no swelling, no edema, no deformity, no laceration and no spasm.       Back:  Mildly positive Spurling maneuver to the left.  Neurological: She is alert and oriented to person, place, and time. She displays normal reflexes. Coordination (moving gingerly favoring her upper back) abnormal.  Skin: Skin is warm and dry.   Dg Thoracic Spine 2 View  Result Date: 09/15/2016 CLINICAL DATA:  Mid back pain, no known injury, initial encounter EXAM: THORACIC SPINE 2 VIEWS COMPARISON:  None. FINDINGS: Vertebral body height is well maintained. Mild osteophytic changes are noted. No paraspinal mass lesion or pedicle abnormality is noted. Some  calcifications are noted projecting over the mid right kidney which may represent renal calculi. The need for further evaluation can be determined on a clinical basis. IMPRESSION: Degenerative change of the thoracic spine. Changes suggestive of right renal calculi. Electronically Signed   By: Inez Catalina M.D.    On: 09/15/2016 15:23    Results for orders placed or performed in visit on 09/15/16 (from the past 24 hour(s))  POCT urinalysis dipstick     Status: Abnormal   Collection Time: 09/15/16  4:14 PM  Result Value Ref Range   Color, UA yellow yellow   Clarity, UA clear clear   Glucose, UA negative negative   Bilirubin, UA negative negative   Ketones, POC UA negative negative   Spec Grav, UA 1.010    Blood, UA negative negative   pH, UA 5.5    Protein Ur, POC negative negative   Urobilinogen, UA 0.2    Nitrite, UA Negative Negative   Leukocytes, UA Trace (A) Negative  POCT Microscopic Urinalysis ([facility])     Status: Abnormal   Collection Time: 09/15/16  4:44 PM  Result Value Ref Range   WBC,UR,HPF,POC Few (A) None WBC/hpf   RBC,UR,HPF,POC None None RBC/hpf   Bacteria None None, Too numerous to count   Mucus Absent Absent   Epithelial Cells, UR Per Microscopy Few (A) None, Too numerous to count cells/hpf   Assessment and Plan :   1. Mid back pain 2. DDD (degenerative disc disease), cervical 3. DDD (degenerative disc disease), thoracic - May be due to both her mild DDD in thoracic area and/or renal stones. I counseled patient on scheduling her APAP, using Anaprox for break through pain. I provided her with a temporary placard for 1 month. If she has no improvement, f/u then and consider repeat x-rays, PT or referral back to neurosurgery. - She will hydrate much better to address possible renal stone. Denies fever, n/v, abdominal pain, dysuria, hematuria. U/A is reassuring. She will strain her urine as well and bring in a stone if she passes this.  Jaynee Eagles, PA-C [facility] [phone] 09/15/2016 2:38 PM

## 2016-09-16 ENCOUNTER — Telehealth: Payer: Self-pay

## 2016-09-16 NOTE — Telephone Encounter (Signed)
Patient called stating she saw [facility] on 09/15/16 and she wanted to get something for anxiety, but it was not brought up in the office visit. So she wants to know if Bess Harvest will call her something in.  I let the patient know that she would more than likely have to come in and be seen again because this is something that has to be discussed to see what her symptoms are to see what medication she needs to be on but she still wanted me to put in a phone message.  Her call back number is [phone]

## 2016-09-17 NOTE — Telephone Encounter (Signed)
Please advise 

## 2016-09-17 NOTE — Telephone Encounter (Signed)
Great job, Engineer, [employer]! You are 100% correct! She needs an OV. I only saw her for back pain, handicap placard at her last OV.

## 2016-09-17 NOTE — Telephone Encounter (Signed)
Pt advised transferred up front for an appt.

## 2016-09-18 ENCOUNTER — Other Ambulatory Visit: Payer: Self-pay | Admitting: Emergency Medicine

## 2016-09-18 ENCOUNTER — Other Ambulatory Visit: Payer: Self-pay | Admitting: Family Medicine

## 2016-09-18 DIAGNOSIS — I10 Essential (primary) hypertension: Secondary | ICD-10-CM

## 2016-09-22 ENCOUNTER — Encounter: Payer: Self-pay | Admitting: Family Medicine

## 2016-09-22 ENCOUNTER — Ambulatory Visit (INDEPENDENT_AMBULATORY_CARE_PROVIDER_SITE_OTHER): Payer: BLUE CROSS/BLUE SHIELD | Admitting: Family Medicine

## 2016-09-22 VITALS — BP 136/80 | HR 65 | Temp 98.2°F | Resp 18 | Ht 65.0 in | Wt 197.0 lb

## 2016-09-22 DIAGNOSIS — J209 Acute bronchitis, unspecified: Secondary | ICD-10-CM | POA: Diagnosis not present

## 2016-09-22 DIAGNOSIS — L819 Disorder of pigmentation, unspecified: Secondary | ICD-10-CM

## 2016-09-22 MED ORDER — FLUCONAZOLE 150 MG PO TABS: 150.0000 mg | ORAL_TABLET | Freq: Once | ORAL | 0 refills | Status: AC

## 2016-09-22 MED ORDER — DOXYCYCLINE HYCLATE 100 MG PO TABS: 100.0000 mg | ORAL_TABLET | Freq: Two times a day (BID) | ORAL | 0 refills | Status: AC

## 2016-09-22 NOTE — Progress Notes (Signed)
Chief Complaint  Patient presents with  . Cough    x 1 wk  . other    spots on ankle, pt wants to make sure its nothing    HPI  Pt reports that she has one week of cough that is nonproductive and unimproved with mucinex and tessalon perles with promethazine cough syrup which did not help much.  She reports that it is finally started to "break up after starting mucinex".  She stopped taking promethazine cough medication. She is drinking plenty of water.  She denies wheezing.  She took an inhaler and it did not help much.  She denies shortness of breath. No fevers or chills.   Ankle  She rpots that a place on her ankle that started like a bump that was itchy and broke open and noticed that she felt like it was scaly and itchy.  She states that it is only on her ankle and noticed that as the bump broke and drained it became hyperpigmented.   Past Medical History:  Diagnosis Date  . Allergy   . Anemia   . Anxiety   . Arthritis   . Blood dyscrasia    sickle cell trait  . Complication of anesthesia not sure   oxygen dropped post ? hyst  . GERD (gastroesophageal reflux disease)   . Heart murmur   . Hypertension   . IBS (irritable bowel syndrome)   . Sickle cell anemia ([facility])    trait    Current Outpatient Prescriptions  Medication Sig Dispense Refill  . amLODipine (NORVASC) 10 MG tablet take 1 tablet by mouth once daily 90 tablet 3  . loratadine (CLARITIN) 10 MG tablet take 1 tablet by mouth once daily 30 tablet 2  . doxycycline (VIBRA-TABS) 100 MG tablet Take 1 tablet (100 mg total) by mouth 2 (two) times daily. 14 tablet 0  . fluticasone (FLONASE) 50 MCG/ACT nasal spray 2 sprays by Each Nare route daily.    . promethazine-dextromethorphan (PROMETHAZINE-DM) 6.25-15 MG/5ML syrup Take 5 mLs by mouth 4 (four) times daily as needed for cough. (Patient not taking: Reported on 09/22/2016) 118 mL 0   No current facility-administered medications for this visit.     Allergies:    Allergies  Allergen Reactions  . Codeine     Hives   . Erythromycin     hives  . Penicillins     hives  . Sulfonamide Derivatives     swelling    Past Surgical History:  Procedure Laterality Date  . CARPAL TUNNEL RELEASE Right 03  . CARPAL TUNNEL RELEASE  08/20/2011   Procedure: CARPAL TUNNEL RELEASE;  Surgeon: Cammie Sickle., MD;  Location: [facility];  Service: Orthopedics;  Laterality: Left;  . CESAREAN SECTION  83,92   x 2  . COLONOSCOPY    . DIAGNOSTIC LAPAROSCOPY     ovarian cyst removal  . DILATION AND CURETTAGE OF UTERUS    . MASS EXCISION Right 07/03/2015   Procedure: EXCISION RIGHT LONG FINGER MASS;  Surgeon: Charlotte Crumb, MD;  Location: [facility];  Service: Orthopedics;  Laterality: Right;  . TUBAL LIGATION      Social History   Social History  . Marital status: Widowed    Spouse name: N/A  . Number of children: 2  . Years of education: N/A   Occupational History  . insurance agent/broker [employer] History Main Topics  . Smoking status: Former Smoker  Years: 8.00    Types: Cigarettes    Quit date: 07/13/2011  . Smokeless tobacco: Never Used  . Alcohol use 0.0 oz/week     Comment: OCCASIONALLY WINE  . Drug use: No  . Sexual activity: Not Currently    Birth control/ protection: Post-menopausal   Other Topics Concern  . Not on file   Social History Narrative  . No narrative on file    ROS  Objective: Vitals:   09/22/16 1733  BP: 136/80  Pulse: 65  Resp: 18  Temp: 98.2 F (36.8 C)  TempSrc: Oral  SpO2: 99%  Weight: 197 lb (89.4 kg)  Height: 5\' 5"  (1.651 m)    Physical Exam  General: alert, oriented, in NAD Head: normocephalic, atraumatic, no sinus tenderness Eyes: EOM intact, no scleral icterus or conjunctival injection Ears: TM clear bilaterally Throat: no pharyngeal exudate or erythema Lymph: no posterior auricular, submental or cervical lymph  adenopathy Heart: normal rate, normal sinus rhythm, no murmurs Lungs: clear to auscultation bilaterally, scant wheezing Skin: hyperpigmented lesion on the left ankle with a regular border   Assessment and Plan Ameilya was seen today for cough and other.  Diagnoses and all orders for this visit:  Acute bronchitis, unspecified organism- due to patient allergies will treat with doxycycline  Hyperpigmented skin lesion- advised vitamin E oil or cocoa butter Will monitor and if it does not lighten will do a bigger derm work up [employer] like it started as a bite and underwent an inflammatory reaction  Other orders -     doxycycline (VIBRA-TABS) 100 MG tablet; Take 1 tablet (100 mg total) by mouth 2 (two) times daily.     [place]

## 2016-09-22 NOTE — Patient Instructions (Addendum)
     IF you received an x-ray today, you will receive an invoice from [facility]. Please contact [facility] at [phone] with questions or concerns regarding your invoice.   IF you received labwork today, you will receive an invoice from [facility]. Please contact [facility] at [phone] with questions or concerns regarding your invoice.   Our billing staff will not be able to assist you with questions regarding bills from these companies.  You will be contacted with the lab results as soon as they are available. The fastest way to get your results is to activate your My Chart account. Instructions are located on the last page of this paperwork. If you have not heard from us regarding the results in 2 weeks, please contact this office.      Acute Bronchitis, Adult Acute bronchitis is when air tubes (bronchi) in the lungs suddenly get swollen. The condition can make it hard to breathe. It can also cause these symptoms:  A cough.  Coughing up clear, yellow, or green mucus.  Wheezing.  Chest congestion.  Shortness of breath.  A fever.  Body aches.  Chills.  A sore throat. Follow these instructions at home: Medicines  Take over-the-counter and prescription medicines only as told by your doctor.  If you were prescribed an antibiotic medicine, take it as told by your doctor. Do not stop taking the antibiotic even if you start to feel better. General instructions  Rest.  Drink enough fluids to keep your pee (urine) clear or pale yellow.  Avoid smoking and secondhand smoke. If you smoke and you need help quitting, ask your doctor. Quitting will help your lungs heal faster.  Use an inhaler, cool mist vaporizer, or humidifier as told by your doctor.  Keep all follow-up visits as told by your doctor. This is important. How is this prevented? To lower your risk of getting this condition again:  Wash your hands often with  soap and water. If you cannot use soap and water, use hand sanitizer.  Avoid contact with people who have cold symptoms.  Try not to touch your hands to your mouth, nose, or eyes.  Make sure to get the flu shot every year. Contact a doctor if:  Your symptoms do not get better in 2 weeks. Get help right away if:  You cough up blood.  You have chest pain.  You have very bad shortness of breath.  You become dehydrated.  You faint (pass out) or keep feeling like you are going to pass out.  You keep throwing up (vomiting).  You have a very bad headache.  Your fever or chills gets worse. This information is not intended to replace advice given to you by your health care provider. Make sure you discuss any questions you have with your health care provider. Document Released: 03/16/2008 Document Revised: 05/06/2016 Document Reviewed: 03/18/2016 Elsevier Interactive Patient Education  2017 Elsevier Inc[employer]  

## 2016-10-01 ENCOUNTER — Other Ambulatory Visit: Payer: Self-pay | Admitting: Family Medicine

## 2016-10-19 ENCOUNTER — Ambulatory Visit
Admission: RE | Admit: 2016-10-19 | Discharge: 2016-10-19 | Disposition: A | Discharge status: Home / Self Care | Payer: BLUE CROSS/BLUE SHIELD | Source: Ambulatory Visit | Attending: Physician Assistant | Admitting: Physician Assistant

## 2016-10-19 DIAGNOSIS — Z1231 Encounter for screening mammogram for malignant neoplasm of breast: Secondary | ICD-10-CM

## 2016-11-04 ENCOUNTER — Encounter: Payer: Self-pay | Admitting: Internal Medicine

## 2016-11-05 ENCOUNTER — Encounter: Payer: Self-pay | Admitting: Internal Medicine

## 2016-11-05 DIAGNOSIS — E1140 Type 2 diabetes mellitus with diabetic neuropathy, unspecified: Secondary | ICD-10-CM | POA: Insufficient documentation

## 2016-11-05 DIAGNOSIS — M519 Unspecified thoracic, thoracolumbar and lumbosacral intervertebral disc disorder: Secondary | ICD-10-CM | POA: Insufficient documentation

## 2016-11-05 NOTE — Progress Notes (Signed)
Opened in error; Disregard.

## 2016-11-20 ENCOUNTER — Ambulatory Visit (INDEPENDENT_AMBULATORY_CARE_PROVIDER_SITE_OTHER): Payer: BLUE CROSS/BLUE SHIELD | Admitting: Family Medicine

## 2016-11-20 VITALS — BP 134/82 | HR 77 | Temp 97.8°F | Ht 65.0 in | Wt 198.4 lb

## 2016-11-20 DIAGNOSIS — J0100 Acute maxillary sinusitis, unspecified: Secondary | ICD-10-CM | POA: Diagnosis not present

## 2016-11-20 DIAGNOSIS — K05219 Aggressive periodontitis, localized, unspecified severity: Secondary | ICD-10-CM

## 2016-11-20 MED ORDER — IBUPROFEN 600 MG PO TABS: 600.0000 mg | ORAL_TABLET | Freq: Three times a day (TID) | ORAL | 0 refills | Status: DC | PRN

## 2016-11-20 MED ORDER — AMOXICILLIN-POT CLAVULANATE 875-125 MG PO TABS: 1.0000 | ORAL_TABLET | Freq: Two times a day (BID) | ORAL | 0 refills | Status: AC

## 2016-11-20 NOTE — Progress Notes (Signed)
Chief Complaint  Patient presents with  . Facial Pain    X 3 days  . Sinus Problem    HPI   Patient reports that she had an acute dental abscess She reports that about 3 days ago she started having swelling on the  She has been taking ibuprofen She feels like something in her nose feels like pressure In the air it feels worse No fevers or chills She reports a history of dental caries and lost her dental insurance so was not able to get her tooth extraction.  Past Medical History:  Diagnosis Date  . Allergy   . Anemia   . Anxiety   . Arthritis   . Blood dyscrasia    sickle cell trait  . Complication of anesthesia not sure   oxygen dropped post ? hyst  . GERD (gastroesophageal reflux disease)   . Heart murmur   . Hypertension   . IBS (irritable bowel syndrome)   . Sickle cell anemia (HCC)    trait    Current Outpatient Prescriptions  Medication Sig Dispense Refill  . amLODipine (NORVASC) 10 MG tablet take 1 tablet by mouth once daily 90 tablet 3  . fluticasone (FLONASE) 50 MCG/ACT nasal spray 2 sprays by Each Nare route daily.    Marland Kitchen loratadine (CLARITIN) 10 MG tablet take 1 tablet by mouth once daily 30 tablet 11  . amoxicillin-clavulanate (AUGMENTIN) 875-125 MG tablet Take 1 tablet by mouth 2 (two) times daily. 20 tablet 0  . ibuprofen (ADVIL,MOTRIN) 600 MG tablet Take 1 tablet (600 mg total) by mouth every 8 (eight) hours as needed. 30 tablet 0  . promethazine-dextromethorphan (PROMETHAZINE-DM) 6.25-15 MG/5ML syrup Take 5 mLs by mouth 4 (four) times daily as needed for cough. (Patient not taking: Reported on 11/20/2016) 118 mL 0   No current facility-administered medications for this visit.     Allergies:  Allergies  Allergen Reactions  . Codeine     Hives   . Erythromycin     hives  . Sulfonamide Derivatives     swelling    Past Surgical History:  Procedure Laterality Date  . CARPAL TUNNEL RELEASE Right 03  . CARPAL TUNNEL RELEASE  08/20/2011   Procedure:  CARPAL TUNNEL RELEASE;  Surgeon: Cammie Sickle., MD;  Location: [facility];  Service: Orthopedics;  Laterality: Left;  . CESAREAN SECTION  83,92   x 2  . COLONOSCOPY    . DIAGNOSTIC LAPAROSCOPY     ovarian cyst removal  . DILATION AND CURETTAGE OF UTERUS    . MASS EXCISION Right 07/03/2015   Procedure: EXCISION RIGHT LONG FINGER MASS;  Surgeon: Charlotte Crumb, MD;  Location: [facility];  Service: Orthopedics;  Laterality: Right;  . TUBAL LIGATION      Social History   Social History  . Marital status: Widowed    Spouse name: N/A  . Number of children: 2  . Years of education: N/A   Occupational History  . insurance agent/broker [employer] History Main Topics  . Smoking status: Former Smoker    Years: 8.00    Types: Cigarettes    Quit date: 07/13/2011  . Smokeless tobacco: Never Used  . Alcohol use 0.0 oz/week     Comment: OCCASIONALLY WINE  . Drug use: No  . Sexual activity: Not Currently    Birth control/ protection: Post-menopausal   Other Topics Concern  . None   Social History Narrative  .  None    ROS  Objective: Vitals:   11/20/16 1203  BP: 134/82  Pulse: 77  Temp: 97.8 F (36.6 C)  TempSrc: Oral  SpO2: 98%  Weight: 198 lb 6.4 oz (90 kg)  Height: 5\' 5"  (1.651 m)    Physical Exam General: alert, oriented, in NAD Head: normocephalic, atraumatic, +maxillary sinus tenderness on the left Teeth: dental caries noted, edema of the gum around the upper molars  Eyes: EOM intact, no scleral icterus or conjunctival injection Ears: TM clear bilaterally Throat: no pharyngeal exudate or erythema Lymph: no posterior auricular, submental or cervical lymph adenopathy Heart: normal rate, normal sinus rhythm, no murmurs Lungs: clear to auscultation bilaterally, no wheezing   Assessment and Plan Rachael Garcia was seen today for facial pain and sinus problem.  Diagnoses and all orders for this  visit:  Acute non-recurrent maxillary sinusitis Periodontal abscess  ADVISED follow up with dentist for deep cleaning and xrays Advised pt to take NSAID and augmentin Removed penicillin allergy since pt tolerated it well in the past -     amoxicillin-clavulanate (AUGMENTIN) 875-125 MG tablet; Take 1 tablet by mouth 2 (two) times daily. -     ibuprofen (ADVIL,MOTRIN) 600 MG tablet; Take 1 tablet (600 mg total) by mouth every 8 (eight) hours as needed.     [place]

## 2016-11-20 NOTE — Patient Instructions (Addendum)
   IF you received an x-ray today, you will receive an invoice from [facility]. Please contact [facility] at [phone] with questions or concerns regarding your invoice.   IF you received labwork today, you will receive an invoice from [facility]. Please contact [facility] at [phone] with questions or concerns regarding your invoice.   Our billing staff will not be able to assist you with questions regarding bills from these companies.  You will be contacted with the lab results as soon as they are available. The fastest way to get your results is to activate your My Chart account. Instructions are located on the last page of this paperwork. If you have not heard from us regarding the results in 2 weeks, please contact this office.      Sinusitis, Adult Sinusitis is soreness and inflammation of your sinuses. Sinuses are hollow spaces in the bones around your face. Your sinuses are located:  Around your eyes.  In the middle of your forehead.  Behind your nose.  In your cheekbones. Your sinuses and nasal passages are lined with a stringy fluid (mucus). Mucus normally drains out of your sinuses. When your nasal tissues become inflamed or swollen, the mucus can become trapped or blocked so air cannot flow through your sinuses. This allows bacteria, viruses, and funguses to grow, which leads to infection. Sinusitis can develop quickly and last for 7?10 days (acute) or for more than 12 weeks (chronic). Sinusitis often develops after a cold. What are the causes? This condition is caused by anything that creates swelling in the sinuses or stops mucus from draining, including:  Allergies.  Asthma.  Bacterial or viral infection.  Abnormally shaped bones between the nasal passages.  Nasal growths that contain mucus (nasal polyps).  Narrow sinus openings.  Pollutants, such as chemicals or irritants in the air.  A foreign object stuck in the nose.  A fungal  infection. This is rare. What increases the risk? The following factors may make you more likely to develop this condition:  Having allergies or asthma.  Having had a recent cold or respiratory tract infection.  Having structural deformities or blockages in your nose or sinuses.  Having a weak immune system.  Doing a lot of swimming or diving.  Overusing nasal sprays.  Smoking. What are the signs or symptoms? The main symptoms of this condition are pain and a feeling of pressure around the affected sinuses. Other symptoms include:  Upper toothache.  Earache.  Headache.  Bad breath.  Decreased sense of smell and taste.  A cough that may get worse at night.  Fatigue.  Fever.  Thick drainage from your nose. The drainage is often green and it may contain pus (purulent).  Stuffy nose or congestion.  Postnasal drip. This is when extra mucus collects in the throat or back of the nose.  Swelling and warmth over the affected sinuses.  Sore throat.  Sensitivity to light. How is this diagnosed? This condition is diagnosed based on symptoms, a medical history, and a physical exam. To find out if your condition is acute or chronic, your health care provider may:  Look in your nose for signs of nasal polyps.  Tap over the affected sinus to check for signs of infection.  View the inside of your sinuses using an imaging device that has a light attached (endoscope). If your health care provider suspects that you have chronic sinusitis, you may also:  Be tested for allergies.  Have a sample of   mucus taken from your nose (nasal culture) and checked for bacteria.  Have a mucus sample examined to see if your sinusitis is related to an allergy. If your sinusitis does not respond to treatment and it lasts longer than 8 weeks, you may have an MRI or CT scan to check your sinuses. These scans also help to determine how severe your infection is. In rare cases, a bone biopsy may  be done to rule out more serious types of fungal sinus disease. How is this treated? Treatment for sinusitis depends on the cause and whether your condition is chronic or acute. If a virus is causing your sinusitis, your symptoms will go away on their own within 10 days. You may be given medicines to relieve your symptoms, including:  Topical nasal decongestants. They shrink swollen nasal passages and let mucus drain from your sinuses.  Antihistamines. These drugs block inflammation that is triggered by allergies. This can help to ease swelling in your nose and sinuses.  Topical nasal corticosteroids. These are nasal sprays that ease inflammation and swelling in your nose and sinuses.  Nasal saline washes. These rinses can help to get rid of thick mucus in your nose. If your condition is caused by bacteria, you will be given an antibiotic medicine. If your condition is caused by a fungus, you will be given an antifungal medicine. Surgery may be needed to correct underlying conditions, such as narrow nasal passages. Surgery may also be needed to remove polyps. Follow these instructions at home: Medicines   Take, use, or apply over-the-counter and prescription medicines only as told by your health care provider. These may include nasal sprays.  If you were prescribed an antibiotic medicine, take it as told by your health care provider. Do not stop taking the antibiotic even if you start to feel better. Hydrate and Humidify   Drink enough water to keep your urine clear or pale yellow. Staying hydrated will help to thin your mucus.  Use a cool mist humidifier to keep the humidity level in your home above 50%.  Inhale steam for 10-15 minutes, 3-4 times a day or as told by your health care provider. You can do this in the bathroom while a hot shower is running.  Limit your exposure to cool or dry air. Rest   Rest as much as possible.  Sleep with your head raised (elevated).  Make sure to  get enough sleep each night. General instructions   Apply a warm, moist washcloth to your face 3-4 times a day or as told by your health care provider. This will help with discomfort.  Wash your hands often with soap and water to reduce your exposure to viruses and other germs. If soap and water are not available, use hand sanitizer.  Do not smoke. Avoid being around people who are smoking (secondhand smoke).  Keep all follow-up visits as told by your health care provider. This is important. Contact a health care provider if:  You have a fever.  Your symptoms get worse.  Your symptoms do not improve within 10 days. Get help right away if:  You have a severe headache.  You have persistent vomiting.  You have pain or swelling around your face or eyes.  You have vision problems.  You develop confusion.  Your neck is stiff.  You have trouble breathing. This information is not intended to replace advice given to you by your health care provider. Make sure you discuss any questions you have with   your health care provider. Document Released: 09/28/2005 Document Revised: 05/24/2016 Document Reviewed: 07/24/2015 Elsevier Interactive Patient Education  2017 Elsevier Inc.  

## 2016-12-03 ENCOUNTER — Ambulatory Visit (INDEPENDENT_AMBULATORY_CARE_PROVIDER_SITE_OTHER): Payer: BLUE CROSS/BLUE SHIELD | Admitting: Physician Assistant

## 2016-12-03 VITALS — BP 138/74 | HR 68 | Temp 98.3°F | Resp 18 | Ht 65.0 in | Wt 200.0 lb

## 2016-12-03 DIAGNOSIS — R21 Rash and other nonspecific skin eruption: Secondary | ICD-10-CM | POA: Diagnosis not present

## 2016-12-03 MED ORDER — CLOTRIMAZOLE-BETAMETHASONE 1-0.05 % EX CREA: 1.0000 "application " | TOPICAL_CREAM | Freq: Two times a day (BID) | CUTANEOUS | 0 refills | Status: DC

## 2016-12-03 NOTE — Patient Instructions (Signed)
     IF you received an x-ray today, you will receive an invoice from [facility]. Please contact [facility] at [phone] with questions or concerns regarding your invoice.   IF you received labwork today, you will receive an invoice from [facility]. Please contact [facility] at [phone] with questions or concerns regarding your invoice.   Our billing staff will not be able to assist you with questions regarding bills from these companies.  You will be contacted with the lab results as soon as they are available. The fastest way to get your results is to activate your My Chart account. Instructions are located on the last page of this paperwork. If you have not heard from us regarding the results in 2 weeks, please contact this office.     

## 2016-12-03 NOTE — Progress Notes (Addendum)
12/03/2016 3:22 PM   DOB: 08-Oct-1956 / MRN: [id number]  SUBJECTIVE:  Rachael Garcia is a 61 y.o. female presenting spots on her ankles. These started 3 months ago.  These are circular.   They do itch but not bad. There is no pain.  She has never had anything like this in the past.  She denies fever, chills, and joint aches.  She is concerned this may be her diabetes but her [name] is most recently 6.5.  She has tried Cortizone cream, peroxide, Vaseline without improvement. She has not taking any new medications since.  Denies a family history of sarcosis.     She is allergic to codeine; erythromycin; and sulfonamide derivatives.   She  has a past medical history of Allergy; Anemia; Anxiety; Arthritis; Blood dyscrasia; Complication of anesthesia (not sure); GERD (gastroesophageal reflux disease); Heart murmur; Hypertension; IBS (irritable bowel syndrome); and Sickle cell anemia ([place]).    She  reports that she quit smoking about 5 years ago. Her smoking use included Cigarettes. She quit after 8.00 years of use. She has never used smokeless tobacco. She reports that she drinks alcohol. She reports that she does not use drugs. She  reports that she does not currently engage in sexual activity. She reports using the following method of birth control/protection: Post-menopausal. The patient  has a past surgical history that includes Tubal ligation; Dilation and curettage of uterus; Colonoscopy; Diagnostic laparoscopy; Cesarean section (94,17); Carpal tunnel release (Right, 03); Carpal tunnel release (08/20/2011); and Mass excision (Right, 07/03/2015).  Her family history includes Alcohol abuse in her father; Anemia in her mother; Anesthesia problems in her sister; Arthritis in her mother; Asthma in her mother; Diabetes in her brother and sister; Heart disease (age of onset: 57) in her father; Hyperlipidemia in her father; Hypertension in her father; Multiple myeloma in her maternal grandmother; Stomach cancer in her  paternal grandmother.  Review of Systems  Constitutional: Negative for chills and fever.  HENT: Negative for sore throat.   Skin: Positive for itching and rash.  Neurological: Negative for dizziness.    The problem list and medications were reviewed and updated by myself where necessary and exist elsewhere in the encounter.   OBJECTIVE:  BP 138/74   Pulse 68   Temp 98.3 F (36.8 C) (Oral)   Resp 18   Ht '5\' 5"'  (1.651 m)   Wt 200 lb (90.7 kg)   SpO2 98%   BMI 33.28 kg/m   Lab Results  Component Value Date   HGBA1C 6.5 (H) 07/09/2016          Physical Exam  Constitutional: She appears well-developed and well-nourished. No distress.  Cardiovascular: Normal rate and regular rhythm.   Pulmonary/Chest: Effort normal and breath sounds normal.  Musculoskeletal: Normal range of motion. She exhibits no edema, tenderness or deformity.  Skin: Rash noted.    Risk and benefits discussed and verbal consent obtained.  Anesthetic allergies review. Sterile prep and drape.  Skin anesthetized with 3 cc 2% lidocaine with epi.  Lesion punched out with large bore punch.  Skin repair with 4-0 ethilon using 2 throws.  Wound edges well approximated.  Mupirocin and sterile bandage applied.    No results found for this or any previous visit (from the past 72 hour(s)).  No results found.  ASSESSMENT AND PLAN:  Rachael Garcia was seen today for rash.  Diagnoses and all orders for this visit:  Rash and nonspecific skin eruption -     clotrimazole-betamethasone (LOTRISONE) cream;  Apply 1 application topically 2 (two) times daily. -     Ambulatory referral to Dermatology -     Dermatology pathology    The patient is advised to call or return to clinic if she does not see an improvement in symptoms, or to seek the care of the closest emergency department if she worsens with the above plan.   Philis Fendt, MHS, PA-C [facility] 12/03/2016 3:22 PM

## 2016-12-12 ENCOUNTER — Telehealth: Payer: Self-pay | Admitting: Physician Assistant

## 2016-12-12 DIAGNOSIS — L439 Lichen planus, unspecified: Secondary | ICD-10-CM

## 2016-12-12 MED ORDER — BETAMETHASONE DIPROPIONATE 0.05 % EX OINT: TOPICAL_OINTMENT | Freq: Two times a day (BID) | CUTANEOUS | 0 refills | Status: DC

## 2016-12-12 NOTE — Telephone Encounter (Signed)
Spoke with patient regarding punch biopsy and results being consistent with lichenplanus.  Will step up therapy to high potency topical ointment.  She will RTC for lipid re-screen as these were elevated.  Hep C negative.  She will go to derm if the problem does not remiss. Philis Fendt, MS, PA-C 9:10 AM, 12/12/2016

## 2016-12-30 ENCOUNTER — Ambulatory Visit (INDEPENDENT_AMBULATORY_CARE_PROVIDER_SITE_OTHER): Payer: BLUE CROSS/BLUE SHIELD

## 2016-12-30 ENCOUNTER — Ambulatory Visit (INDEPENDENT_AMBULATORY_CARE_PROVIDER_SITE_OTHER): Payer: BLUE CROSS/BLUE SHIELD | Admitting: Family Medicine

## 2016-12-30 VITALS — BP 134/84 | HR 70 | Temp 97.7°F | Resp 17 | Ht 65.0 in | Wt 202.0 lb

## 2016-12-30 DIAGNOSIS — R350 Frequency of micturition: Secondary | ICD-10-CM | POA: Diagnosis not present

## 2016-12-30 DIAGNOSIS — N200 Calculus of kidney: Secondary | ICD-10-CM

## 2016-12-30 DIAGNOSIS — R1031 Right lower quadrant pain: Secondary | ICD-10-CM

## 2016-12-30 LAB — POC MICROSCOPIC URINALYSIS (UMFC): Mucus: ABSENT

## 2016-12-30 LAB — POCT URINALYSIS DIP (MANUAL ENTRY)
Bilirubin, UA: NEGATIVE
Glucose, UA: NEGATIVE
Ketones, POC UA: NEGATIVE
Nitrite, UA: NEGATIVE
Protein Ur, POC: NEGATIVE
Spec Grav, UA: 1.005
Urobilinogen, UA: 0.2
pH, UA: 5.5

## 2016-12-30 MED ORDER — TAMSULOSIN HCL 0.4 MG PO CAPS: 0.4000 mg | ORAL_CAPSULE | Freq: Every day | ORAL | 0 refills | Status: DC

## 2016-12-30 MED ORDER — TRAMADOL HCL 50 MG PO TABS: 100.0000 mg | ORAL_TABLET | Freq: Three times a day (TID) | ORAL | 0 refills | Status: DC | PRN

## 2016-12-30 NOTE — Progress Notes (Signed)
Rachael Garcia is a 61 y.o. female who presents to Primary Care at [facility] today for:  1.  Abdominal Pain. She presents with right-sided back pain that has radiated to her right lower quadrant since yesterday. Describes the pain as sharp and dull. Denies dysuria, fever, vaginal discharge. Has had some nausea but without emesis. Unsure if she has blood in urine. Did notice yesterday that she had blood in her toilet bowel after hard bowel movement but unaware if from rectum or bladder.  H/o constipation and chronic back pain. This back pan is new.   ROS as above.  Pertinently, no chest pain, palpitations, SOB, Fever, Chills, diarrhea.   PMH reviewed. Patient is a nonsmoker.   Past Medical History:  Diagnosis Date  . Allergy   . Anemia   . Anxiety   . Arthritis   . Blood dyscrasia    sickle cell trait  . Complication of anesthesia not sure   oxygen dropped post ? hyst  . GERD (gastroesophageal reflux disease)   . Heart murmur   . Hypertension   . IBS (irritable bowel syndrome)   . Sickle cell anemia (HCC)    trait   Past Surgical History:  Procedure Laterality Date  . CARPAL TUNNEL RELEASE Right 03  . CARPAL TUNNEL RELEASE  08/20/2011   Procedure: CARPAL TUNNEL RELEASE;  Surgeon: Cammie Sickle., MD;  Location: [facility];  Service: Orthopedics;  Laterality: Left;  . CESAREAN SECTION  83,92   x 2  . COLONOSCOPY    . DIAGNOSTIC LAPAROSCOPY     ovarian cyst removal  . DILATION AND CURETTAGE OF UTERUS    . MASS EXCISION Right 07/03/2015   Procedure: EXCISION RIGHT LONG FINGER MASS;  Surgeon: Charlotte Crumb, MD;  Location: [facility];  Service: Orthopedics;  Laterality: Right;  . TUBAL LIGATION      Medications reviewed. Current Outpatient Prescriptions  Medication Sig Dispense Refill  . amLODipine (NORVASC) 10 MG tablet take 1 tablet by mouth once daily 90 tablet 3  . betamethasone dipropionate (DIPROLENE) 0.05 % ointment Apply topically 2  (two) times daily. 30 g 0  . clotrimazole-betamethasone (LOTRISONE) cream Apply 1 application topically 2 (two) times daily. 30 g 0  . fluticasone (FLONASE) 50 MCG/ACT nasal spray 2 sprays by Each Nare route daily.    Marland Kitchen loratadine (CLARITIN) 10 MG tablet take 1 tablet by mouth once daily 30 tablet 11   No current facility-administered medications for this visit.     Physical Exam:  BP 134/84 (BP Location: Right Arm, Patient Position: Sitting, Cuff Size: Normal)   Pulse 70   Temp 97.7 F (36.5 C) (Oral)   Resp 17   Ht 5\' 5"  (1.651 m)   Wt 202 lb (91.6 kg)   SpO2 100%   BMI 33.61 kg/m  Gen:  Alert, cooperative patient who appears stated age in mild acute distress.  Vital signs reviewed. HEENT: EOMI,  MMM Pulm:  Clear to auscultation bilaterally with good air movement.  No wheezes or rales noted.   Cardiac:  Regular rate and rhythm without murmur auscultated.  Good S1/S2. Abd:  Soft, right sided tenderness.  Good bowel sounds throughout all four quadrants.  No masses noted. No guarding or rebound.  Results for orders placed or performed in visit on 12/30/16  POCT Microscopic Urinalysis ([facility])  Result Value Ref Range   WBC,UR,HPF,POC Few (A) None WBC/hpf   RBC,UR,HPF,POC Too numerous to count  (A) None  RBC/hpf   Bacteria None None, Too numerous to count   Mucus Absent Absent   Epithelial Cells, UR Per Microscopy Few (A) None, Too numerous to count cells/hpf  POCT urinalysis dipstick  Result Value Ref Range   Color, UA brown (A) yellow   Clarity, UA clear clear   Glucose, UA negative negative   Bilirubin, UA negative negative   Ketones, POC UA negative negative   Spec Grav, UA 1.005 1.030 - 1.035   Blood, UA large (A) negative   pH, UA 5.5 5.0 - 8.0   Protein Ur, POC negative negative   Urobilinogen, UA 0.2 Negative - 2.0   Nitrite, UA Negative Negative   Leukocytes, UA Trace (A) Negative    Dg Abd 1 View  Result Date: 12/30/2016 CLINICAL DATA:  Right lower quadrant  pain. EXAM: ABDOMEN - 1 VIEW COMPARISON:  No recent prior . FINDINGS: Soft tissue structures are unremarkable. Calcifications noted over the kidneys consistent with stones. Prominent calcifications in the pelvis consistent with fibroids. Distal ureteral stone fragment cannot be excluded. Air-filled loops of small large bowel noted consistent adynamic ileus. No acute bony abnormality identified. Degenerative changes lumbar spine and both hips. IMPRESSION: 1.  Right nephrolithiasis. 2. Calcified pelvic fibroids. Calcifications in pelvis also noted consistent phleboliths. Distal ureteral stones cannot be completely excluded. Electronically Signed   By: Marcello Moores  Register   On: 12/30/2016 17:03     Assessment and Plan:  1. Right lower quadrant abdominal pain RLQ abdominal pain with history concerning for renal stones. Patient with KUB showing renal stones. Patient also had a stool burden. Instructed patient to start a bowel regimen as this can also be causing some abdominal pain.  - DG Abd 1 View; Future  2. Urinary frequency UA with large blood. No signs of UTI. Renal stones most likely causing patient to have decreased urinary stream.  - POCT Microscopic Urinalysis ([facility]) - POCT urinalysis dipstick  3. Renal stones Seen on imaging today. Lab findings and history corroborate this. Will give Rx for flomax to help with flow. Patient instructed to drink plenty of fluids. Rx for tramadol given for pain. Patient to return to clinic in 3 days for reevaluation. Return precautions given. Handout provided.     Luiz Blare, DO  12/30/2016, 4:29 PM PGY-3, [place]

## 2016-12-30 NOTE — Patient Instructions (Addendum)
Kidney Stones Kidney stones (urolithiasis) are solid, rock-like deposits that form inside of the organs that make urine (kidneys). A kidney stone may form in a kidney and move into the bladder, where it can cause intense pain and block the flow of urine. Kidney stones are created when high levels of certain minerals are found in the urine. They are usually passed through urination, but in some cases, medical treatment may be needed to remove them. What are the causes? Kidney stones may be caused by:  A condition in which certain glands produce too much parathyroid hormone (primary hyperparathyroidism), which causes too much calcium buildup in the blood.  Buildup of uric acid crystals in the bladder (hyperuricosuria). Uric acid is a chemical that the body produces when you eat certain foods. It usually exits the body in the urine.  Narrowing (stricture) of one or both of the tubes that drain urine from the kidneys to the bladder (ureters).  A kidney blockage that is present at birth (congenital obstruction).  Past surgery on the kidney or the ureters, such as gastric bypass surgery. What increases the risk? The following factors make you more likely to develop kidney stones:  Having had a kidney stone in the past.  Having a family history of kidney stones.  Not drinking enough water.  Eating a diet that is high in protein, salt (sodium), or sugar.  Being overweight or obese. What are the signs or symptoms? Symptoms of a kidney stone may include:  Nausea.  Vomiting.  Blood in the urine (hematuria).  Pain in the side of the abdomen, right below the ribs (flank pain). Pain usually spreads (radiates) to the groin.  Needing to urinate frequently or urgently. How is this diagnosed? This condition may be diagnosed based on:  Your medical history.  A physical exam.  Blood tests.  Urine tests.  CT scan.  Abdominal X-ray.  A procedure to examine the inside of the bladder  (cystoscopy). How is this treated? Treatment for kidney stones depends on the size, location, and makeup of the stones. Treatment may involve:  Analyzing your urine before and after you pass the stone through urination.  Being monitored at the hospital until you pass the stone through urination.  Increasing your fluid intake and decreasing the amount of calcium and protein in your diet.  A procedure to break up kidney stones in the bladder using:  A focused beam of light (laser therapy).  Shock waves (extracorporeal shock wave lithotripsy).  Surgery to remove kidney stones. This may be needed if you have severe pain or have stones that block your urinary tract. Follow these instructions at home: Eating and drinking    Drink enough fluid to keep your urine clear or pale yellow. This will help you to pass the kidney stone.  If directed, change your diet. This may include:  Limiting how much sodium you eat.  Eating more fruits and vegetables.  Limiting how much meat, poultry, fish, and eggs you eat.  Follow instructions from your health care provider about eating or drinking restrictions. General instructions   Collect urine samples as told by your health care provider. You may need to collect a urine sample:  24 hours after you pass the stone.  8-12 weeks after passing the kidney stone, and every 6-12 months after that.  Strain your urine every time you urinate, for as long as directed. Use the strainer that your health care provider recommends.  Do not throw out the kidney stone  after passing it. Keep the stone so it can be tested by your health care provider. Testing the makeup of your kidney stone may help prevent you from getting kidney stones in the future.  Take over-the-counter and prescription medicines only as told by your health care provider.  Keep all follow-up visits as told by your health care provider. This is important. You may need follow-up X-rays or  ultrasounds to make sure that your stone has passed. How is this prevented? To prevent another kidney stone:  Drink enough fluid to keep your urine clear or pale yellow. This is the best way to prevent kidney stones.  Eat a healthy diet and follow recommendations from your health care provider about foods to avoid. You may be instructed to eat a low-protein diet. Recommendations vary depending on the type of kidney stone that you have.  Maintain a healthy weight. Contact a health care provider if:  You have pain that gets worse or does not get better with medicine. Get help right away if:  You have a fever or chills.  You develop severe pain.  You develop new abdominal pain.  You faint.  You are unable to urinate. This information is not intended to replace advice given to you by your health care provider. Make sure you discuss any questions you have with your health care provider. Document Released: 09/28/2005 Document Revised: 04/17/2016 Document Reviewed: 03/13/2016 Elsevier Interactive Patient Education  2017 [employer].     IF you received an x-ray today, you will receive an invoice from [facility]. Please contact [facility] at [phone] with questions or concerns regarding your invoice.   IF you received labwork today, you will receive an invoice from [place]. Please contact [facility] at [phone] with questions or concerns regarding your invoice.   Our billing staff will not be able to assist you with questions regarding bills from these companies.  You will be contacted with the lab results as soon as they are available. The fastest way to get your results is to activate your My Chart account. Instructions are located on the last page of this paperwork. If you have not heard from [place] regarding the results in 2 weeks, please contact this office.

## 2017-02-15 ENCOUNTER — Ambulatory Visit (INDEPENDENT_AMBULATORY_CARE_PROVIDER_SITE_OTHER): Payer: BLUE CROSS/BLUE SHIELD | Admitting: Emergency Medicine

## 2017-02-15 ENCOUNTER — Encounter: Payer: Self-pay | Admitting: Emergency Medicine

## 2017-02-15 VITALS — BP 131/77 | HR 83 | Temp 98.6°F | Resp 16 | Ht 65.0 in | Wt 199.6 lb

## 2017-02-15 DIAGNOSIS — W57XXXA Bitten or stung by nonvenomous insect and other nonvenomous arthropods, initial encounter: Secondary | ICD-10-CM

## 2017-02-15 DIAGNOSIS — L299 Pruritus, unspecified: Secondary | ICD-10-CM

## 2017-02-15 DIAGNOSIS — L089 Local infection of the skin and subcutaneous tissue, unspecified: Secondary | ICD-10-CM

## 2017-02-15 DIAGNOSIS — J029 Acute pharyngitis, unspecified: Secondary | ICD-10-CM | POA: Diagnosis not present

## 2017-02-15 MED ORDER — CEFADROXIL 500 MG PO CAPS: 500.0000 mg | ORAL_CAPSULE | Freq: Two times a day (BID) | ORAL | 0 refills | Status: AC

## 2017-02-15 MED ORDER — PROMETHAZINE-DM 6.25-15 MG/5ML PO SYRP: 5.0000 mL | ORAL_SOLUTION | Freq: Four times a day (QID) | ORAL | 0 refills | Status: DC | PRN

## 2017-02-15 NOTE — Patient Instructions (Addendum)
     IF you received an x-ray today, you will receive an invoice from [facility]. Please contact [facility] at [phone] with questions or concerns regarding your invoice.   IF you received labwork today, you will receive an invoice from [place]. Please contact [facility] at [phone] with questions or concerns regarding your invoice.   Our billing staff will not be able to assist you with questions regarding bills from these companies.  You will be contacted with the lab results as soon as they are available. The fastest way to get your results is to activate your My Chart account. Instructions are located on the last page of this paperwork. If you have not heard from [place] regarding the results in 2 weeks, please contact this office.      Sore Throat When you have a sore throat, your throat may:  Hurt.  Burn.  Feel irritated.  Feel scratchy. Many things can cause a sore throat, including:  An infection.  Allergies.  Dryness in the air.  Smoke or pollution.  Gastroesophageal reflux disease (GERD).  A tumor. A sore throat can be the first sign of another sickness. It can happen with other problems, like coughing or a fever. Most sore throats go away without treatment. Follow these instructions at home:  Take over-the-counter medicines only as told by your doctor.  Drink enough fluids to keep your pee (urine) clear or pale yellow.  Rest when you feel you need to.  To help with pain, try:  Sipping warm liquids, such as broth, herbal tea, or warm water.  Eating or drinking cold or frozen liquids, such as frozen ice pops.  Gargling with a salt-water mixture 3-4 times a day or as needed. To make a salt-water mixture, add -1 tsp of salt in 1 cup of warm water. Mix it until you cannot see the salt anymore.  Sucking on hard candy or throat lozenges.  Putting a cool-mist humidifier in your bedroom at night.  Sitting in the bathroom with the door  closed for 5-10 minutes while you run hot water in the shower.  Do not use any tobacco products, such as cigarettes, chewing tobacco, and e-cigarettes. If you need help quitting, ask your doctor. Contact a doctor if:  You have a fever for more than 2-3 days.  You keep having symptoms for more than 2-3 days.  Your throat does not get better in 7 days.  You have a fever and your symptoms suddenly get worse. Get help right away if:  You have trouble breathing.  You cannot swallow fluids, soft foods, or your saliva.  You have swelling in your throat or neck that gets worse.  You keep feeling like you are going to throw up (vomit).  You keep throwing up. This information is not intended to replace advice given to you by your health care provider. Make sure you discuss any questions you have with your health care provider. Document Released: 07/07/2008 Document Revised: 05/24/2016 Document Reviewed: 07/19/2015 Elsevier Interactive Patient Education  2017 [employer].

## 2017-02-15 NOTE — Progress Notes (Signed)
Rachael Garcia 61 y.o.   Chief Complaint  Patient presents with  . URI    hurting really bad last night but not as bad today but swallowing is painful. cough x 3 days, very itchy     HISTORY OF PRESENT ILLNESS: This is a 61 y.o. female complaining of sore throat x 3- days; also c/o itchy mosquito bites x 1 week. No other significant symptoms.  HPI   Prior to Admission medications   Medication Sig Start Date End Date Taking? Authorizing Provider  amLODipine (NORVASC) 10 MG tablet take 1 tablet by mouth once daily 09/18/16  Yes Copland, Gay Filler, MD  betamethasone dipropionate (DIPROLENE) 0.05 % ointment Apply topically 2 (two) times daily. 12/12/16  Yes Tereasa Coop, PA-C  clotrimazole-betamethasone (LOTRISONE) cream Apply 1 application topically 2 (two) times daily. 12/03/16  Yes Tereasa Coop, PA-C  fluticasone (FLONASE) 50 MCG/ACT nasal spray 2 sprays by Each Nare route daily. 12/05/15  Yes [provider]  loratadine (CLARITIN) 10 MG tablet take 1 tablet by mouth once daily 10/02/16  Yes Stallings, Zoe A, MD  tamsulosin (FLOMAX) 0.4 MG CAPS capsule Take 1 capsule (0.4 mg total) by mouth daily. 12/30/16  Yes Katheren Shams, DO  traMADol (ULTRAM) 50 MG tablet Take 2 tablets (100 mg total) by mouth every 8 (eight) hours as needed for moderate pain or severe pain. 12/30/16  Yes Luiz Blare Y, DO    Allergies  Allergen Reactions  . Codeine     Hives   . Erythromycin     hives  . Sulfonamide Derivatives     swelling    Patient Active Problem List   Diagnosis Date Noted  . Neuropathy in diabetes ([place])   . Lumbosacral disc disease   . Hyperlipidemia 08/14/2010  . ANXIETY 08/14/2010  . Essential hypertension 08/14/2010  . RUQ PAIN 08/14/2010  . LACTOSE INTOLERANCE 08/13/2010  . SICKLE CELL TRAIT 08/13/2010  . GERD 08/13/2010  . IRRITABLE BOWEL SYNDROME 08/13/2010  . Allergic rhinitis 04/04/2007  . ASTHMA 04/04/2007    Past Medical History:  Diagnosis  Date  . Allergy   . Anemia   . Anxiety   . Arthritis   . Blood dyscrasia    sickle cell trait  . Complication of anesthesia not sure   oxygen dropped post ? hyst  . GERD (gastroesophageal reflux disease)   . Heart murmur   . Hypertension   . IBS (irritable bowel syndrome)   . Sickle cell anemia (H[facility])    trait    Past Surgical History:  Procedure Laterality Date  . CARPAL TUNNEL RELEASE Right 03  . CARPAL TUNNEL RELEASE  08/20/2011   Procedure: CARPAL TUNNEL RELEASE;  Surgeon: Cammie Sickle., MD;  Location: [facility];  Service: Orthopedics;  Laterality: Left;  . CESAREAN SECTION  83,92   x 2  . COLONOSCOPY    . DIAGNOSTIC LAPAROSCOPY     ovarian cyst removal  . DILATION AND CURETTAGE OF UTERUS    . MASS EXCISION Right 07/03/2015   Procedure: EXCISION RIGHT LONG FINGER MASS;  Surgeon: Charlotte Crumb, MD;  Location: [place];  Service: Orthopedics;  Laterality: Right;  . TUBAL LIGATION      Social History   Social History  . Marital status: Widowed    Spouse name: N/A  . Number of children: 2  . Years of education: N/A   Occupational History  . 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 Smoking status: Former Smoker    Years: 8.00    Types: Cigarettes    Quit date: 07/13/2011  . Smokeless tobacco: Never Used  . Alcohol use 0.0 oz/week     Comment: OCCASIONALLY WINE  . Drug use: No  . Sexual activity: Not Currently    Birth control/ protection: Post-menopausal   Other Topics Concern  . Not on file   Social History Narrative  . No narrative on file    Family History  Problem Relation Age of Onset  . Arthritis Mother   . Anemia Mother   . Asthma Mother   . Diabetes Sister   . Hypertension Father   . Alcohol abuse Father   . Heart disease Father 43    defibrillator and pacemaker  . Hyperlipidemia Father   . Stomach cancer Paternal Grandmother   . Multiple  myeloma Maternal Grandmother   . Anesthesia problems Sister   . Diabetes Brother      Review of Systems  Constitutional: Negative.  Negative for chills, fever and malaise/fatigue.  HENT: Positive for sore throat. Negative for congestion, ear pain and nosebleeds.   Eyes: Negative for discharge and redness.  Respiratory: Positive for cough. Negative for shortness of breath and wheezing.   Cardiovascular: Negative for chest pain, palpitations and leg swelling.  Gastrointestinal: Negative for abdominal pain, diarrhea, nausea and vomiting.  Genitourinary: Negative for dysuria and hematuria.  Musculoskeletal: Negative for back pain, myalgias and neck pain.  Skin: Positive for itching and rash.  Neurological: Negative for dizziness and headaches.  Endo/Heme/Allergies: Negative.   All other systems reviewed and are negative.  Vitals:   02/15/17 1334  BP: 131/77  Pulse: 83  Resp: 16  Temp: 98.6 F (37 C)     Physical Exam  Constitutional: She is oriented to person, place, and time. She appears well-developed and well-nourished.  HENT:  Head: Normocephalic and atraumatic.  Mouth/Throat: Uvula is midline and mucous membranes are normal. Posterior oropharyngeal edema (mild) and posterior oropharyngeal erythema present. No oropharyngeal exudate.  Eyes: Conjunctivae and EOM are normal. Pupils are equal, round, and reactive to light.  Neck: Normal range of motion. Neck supple. No JVD present. No thyromegaly present.  Cardiovascular: Normal rate, regular rhythm and normal heart sounds.   Pulmonary/Chest: Effort normal and breath sounds normal.  Abdominal: Soft. Bowel sounds are normal. She exhibits no distension. There is no tenderness.  Musculoskeletal: Normal range of motion.  Lymphadenopathy:    She has no cervical adenopathy.  Neurological: She is alert and oriented to person, place, and time. No sensory deficit. She exhibits normal muscle tone.  Skin: Skin is warm and dry. Capillary  refill takes less than 2 seconds.  Several infected mosquito bites in trunk and arms  Psychiatric: She has a normal mood and affect. Her behavior is normal.  Vitals reviewed.    ASSESSMENT & PLAN: Rachael Garcia was seen today for uri.  Diagnoses and all orders for this visit:  Acute pharyngitis, unspecified etiology  Mosquito bite, initial encounter  Itching  Skin infection  Other orders -     cefadroxil (DURICEF) 500 MG capsule; Take 1 capsule (500 mg total) by mouth 2 (two) times daily. -     promethazine-dextromethorphan (PROMETHAZINE-DM) 6.25-15 MG/5ML syrup; Take 5 mLs by mouth 4 (four) times daily as needed for cough.    Patient Instructions       IF you received an x-ray today, you will receive an  invoice from [facility]. Please contact [facility] at [phone] with questions or concerns regarding your invoice.   IF you received labwork today, you will receive an invoice from [place]. Please contact [facility] at [phone] with questions or concerns regarding your invoice.   Our billing staff will not be able to assist you with questions regarding bills from these companies.  You will be contacted with the lab results as soon as they are available. The fastest way to get your results is to activate your My Chart account. Instructions are located on the last page of this paperwork. If you have not heard from [place] regarding the results in 2 weeks, please contact this office.      Sore Throat When you have a sore throat, your throat may:  Hurt.  Burn.  Feel irritated.  Feel scratchy. Many things can cause a sore throat, including:  An infection.  Allergies.  Dryness in the air.  Smoke or pollution.  Gastroesophageal reflux disease (GERD).  A tumor. A sore throat can be the first sign of another sickness. It can happen with other problems, like coughing or a fever. Most sore throats go away without treatment. Follow these  instructions at home:  Take over-the-counter medicines only as told by your doctor.  Drink enough fluids to keep your pee (urine) clear or pale yellow.  Rest when you feel you need to.  To help with pain, try:  Sipping warm liquids, such as broth, herbal tea, or warm water.  Eating or drinking cold or frozen liquids, such as frozen ice pops.  Gargling with a salt-water mixture 3-4 times a day or as needed. To make a salt-water mixture, add -1 tsp of salt in 1 cup of warm water. Mix it until you cannot see the salt anymore.  Sucking on hard candy or throat lozenges.  Putting a cool-mist humidifier in your bedroom at night.  Sitting in the bathroom with the door closed for 5-10 minutes while you run hot water in the shower.  Do not use any tobacco products, such as cigarettes, chewing tobacco, and e-cigarettes. If you need help quitting, ask your doctor. Contact a doctor if:  You have a fever for more than 2-3 days.  You keep having symptoms for more than 2-3 days.  Your throat does not get better in 7 days.  You have a fever and your symptoms suddenly get worse. Get help right away if:  You have trouble breathing.  You cannot swallow fluids, soft foods, or your saliva.  You have swelling in your throat or neck that gets worse.  You keep feeling like you are going to throw up (vomit).  You keep throwing up. This information is not intended to replace advice given to you by your health care provider. Make sure you discuss any questions you have with your health care provider. Document Released: 07/07/2008 Document Revised: 05/24/2016 Document Reviewed: 07/19/2015 Elsevier Interactive Patient Education  2017 [employer]      Agustina Caroli, MD [facility]

## 2017-02-22 ENCOUNTER — Other Ambulatory Visit: Payer: Self-pay | Admitting: Obstetrics and Gynecology

## 2017-03-06 ENCOUNTER — Telehealth: Payer: Self-pay | Admitting: Physician Assistant

## 2017-03-06 NOTE — Telephone Encounter (Signed)
Pt is calling to check on her refill of Flonase.  She states the pharmacy sent in a request days ago and it still hasn't been called in.  She uses the [facility] on [address]. [phone]

## 2017-03-08 MED ORDER — FLUTICASONE PROPIONATE 50 MCG/ACT NA SUSP: NASAL | 0 refills | Status: DC

## 2017-04-06 DIAGNOSIS — R059 Cough, unspecified: Secondary | ICD-10-CM | POA: Insufficient documentation

## 2017-04-26 ENCOUNTER — Other Ambulatory Visit: Payer: Self-pay | Admitting: Emergency Medicine

## 2017-05-28 ENCOUNTER — Emergency Department (HOSPITAL_COMMUNITY): Payer: BLUE CROSS/BLUE SHIELD

## 2017-05-28 ENCOUNTER — Encounter (HOSPITAL_COMMUNITY): Payer: Self-pay | Admitting: *Deleted

## 2017-05-28 ENCOUNTER — Emergency Department (HOSPITAL_COMMUNITY)
Admission: EM | Admit: 2017-05-28 | Discharge: 2017-05-28 | Disposition: A | Discharge status: Home / Self Care | Payer: BLUE CROSS/BLUE SHIELD | Attending: Emergency Medicine | Admitting: Emergency Medicine

## 2017-05-28 DIAGNOSIS — J45909 Unspecified asthma, uncomplicated: Secondary | ICD-10-CM | POA: Diagnosis not present

## 2017-05-28 DIAGNOSIS — D573 Sickle-cell trait: Secondary | ICD-10-CM | POA: Insufficient documentation

## 2017-05-28 DIAGNOSIS — Z87891 Personal history of nicotine dependence: Secondary | ICD-10-CM | POA: Insufficient documentation

## 2017-05-28 DIAGNOSIS — I10 Essential (primary) hypertension: Secondary | ICD-10-CM | POA: Insufficient documentation

## 2017-05-28 DIAGNOSIS — R1084 Generalized abdominal pain: Secondary | ICD-10-CM | POA: Diagnosis present

## 2017-05-28 DIAGNOSIS — N200 Calculus of kidney: Secondary | ICD-10-CM

## 2017-05-28 HISTORY — DX: Calculus of kidney: N20.0

## 2017-05-28 LAB — CBC WITH DIFFERENTIAL/PLATELET
Basophils Absolute: 0 10*3/uL (ref 0.0–0.1)
Basophils Relative: 0 %
EOS ABS: 0.1 10*3/uL (ref 0.0–0.7)
EOS PCT: 1 %
HCT: 38.7 % (ref 36.0–46.0)
Hemoglobin: 13.4 g/dL (ref 12.0–15.0)
LYMPHS ABS: 1.8 10*3/uL (ref 0.7–4.0)
Lymphocytes Relative: 16 %
MCH: 28.3 pg (ref 26.0–34.0)
MCHC: 34.6 g/dL (ref 30.0–36.0)
MCV: 81.6 fL (ref 78.0–100.0)
MONOS PCT: 8 %
Monocytes Absolute: 0.8 10*3/uL (ref 0.1–1.0)
Neutro Abs: 8.2 10*3/uL — ABNORMAL HIGH (ref 1.7–7.7)
Neutrophils Relative %: 75 %
PLATELETS: 223 10*3/uL (ref 150–400)
RBC: 4.74 MIL/uL (ref 3.87–5.11)
RDW: 13.8 % (ref 11.5–15.5)
WBC: 10.9 10*3/uL — AB (ref 4.0–10.5)

## 2017-05-28 LAB — URINALYSIS, ROUTINE W REFLEX MICROSCOPIC
BILIRUBIN URINE: NEGATIVE
Glucose, UA: NEGATIVE mg/dL
Ketones, ur: NEGATIVE mg/dL
Nitrite: NEGATIVE
Protein, ur: NEGATIVE mg/dL
SPECIFIC GRAVITY, URINE: 1.015 (ref 1.005–1.030)
pH: 5 (ref 5.0–8.0)

## 2017-05-28 LAB — COMPREHENSIVE METABOLIC PANEL
ALT: 19 U/L (ref 14–54)
AST: 25 U/L (ref 15–41)
Albumin: 4.1 g/dL (ref 3.5–5.0)
Alkaline Phosphatase: 52 U/L (ref 38–126)
Anion gap: 8 (ref 5–15)
BUN: 12 mg/dL (ref 6–20)
CHLORIDE: 105 mmol/L (ref 101–111)
CO2: 25 mmol/L (ref 22–32)
Calcium: 8.9 mg/dL (ref 8.9–10.3)
Creatinine, Ser: 1.17 mg/dL — ABNORMAL HIGH (ref 0.44–1.00)
GFR calc non Af Amer: 49 mL/min — ABNORMAL LOW (ref >60)
GFR, EST AFRICAN AMERICAN: 57 mL/min — AB (ref >60)
Glucose, Bld: 140 mg/dL — ABNORMAL HIGH (ref 65–99)
Potassium: 4.2 mmol/L (ref 3.5–5.1)
SODIUM: 138 mmol/L (ref 135–145)
Total Bilirubin: 0.9 mg/dL (ref 0.3–1.2)
Total Protein: 6.6 g/dL (ref 6.5–8.1)

## 2017-05-28 MED ORDER — SODIUM CHLORIDE 0.9 % IV BOLUS (SEPSIS)
1000.0000 mL | Freq: Once | INTRAVENOUS | Status: AC
  Administered 2017-05-28: 1000 mL via INTRAVENOUS

## 2017-05-28 MED ORDER — KETOROLAC TROMETHAMINE 30 MG/ML IJ SOLN
30.0000 mg | Freq: Once | INTRAMUSCULAR | Status: AC
  Administered 2017-05-28: 30 mg via INTRAVENOUS
  Filled 2017-05-28: qty 1

## 2017-05-28 MED ORDER — OXYCODONE-ACETAMINOPHEN 5-325 MG PO TABS
2.0000 | ORAL_TABLET | Freq: Once | ORAL | Status: AC
  Administered 2017-05-28: 2 via ORAL
  Filled 2017-05-28: qty 2

## 2017-05-28 MED ORDER — OXYCODONE-ACETAMINOPHEN 5-325 MG PO TABS: 1.0000 | ORAL_TABLET | Freq: Four times a day (QID) | ORAL | 0 refills | Status: DC | PRN

## 2017-05-28 MED ORDER — ONDANSETRON 4 MG PO TBDP: 4.0000 mg | ORAL_TABLET | Freq: Three times a day (TID) | ORAL | 0 refills | Status: DC | PRN

## 2017-05-28 MED ORDER — ONDANSETRON HCL 4 MG/2ML IJ SOLN
4.0000 mg | Freq: Once | INTRAMUSCULAR | Status: AC
  Administered 2017-05-28: 4 mg via INTRAVENOUS
  Filled 2017-05-28: qty 2

## 2017-05-28 MED ORDER — FENTANYL CITRATE (PF) 100 MCG/2ML IJ SOLN
50.0000 ug | Freq: Once | INTRAMUSCULAR | Status: AC
  Administered 2017-05-28: 50 ug via INTRAVENOUS
  Filled 2017-05-28: qty 2

## 2017-05-28 NOTE — ED Provider Notes (Signed)
[facility] DEPT Provider Note   CSN: [id number] Arrival date & time: 05/28/17  1010     History   Chief Complaint Chief Complaint  Patient presents with  . Flank Pain    HPI Rachael Garcia is a 61 y.o. female.  HPI   Rachael Garcia is a 61 y.o. female, with a history of Anemia, sickle cell trait, GERD, HTN, and kidney stones, presenting to the ED with Right flank pain for the last 2 days. Patient states her pain started as a dull ache in the right lower back and then began to radiate towards the abdomen. Her current pain is more sharp, intermittent, located in the right flank, radiating towards the suprapubic region, currently 5/10 following ibuprofen a few hours ago. She also endorses intermittent cold sweats, pressure with urination, and nausea. She had similar onset of pain in March of this year, but states her current pain is worse. She was found to have nephrolithiasis on the right on an abdominal plain film in March. Denies abnormal vaginal discharge, vaginal bleeding, fever, diarrhea/constipation, vomiting, or any other complaints.       Past Medical History:  Diagnosis Date  . Allergy   . Anemia   . Anxiety   . Arthritis   . Blood dyscrasia    sickle cell trait  . Complication of anesthesia not sure   oxygen dropped post ? hyst  . GERD (gastroesophageal reflux disease)   . Heart murmur   . Hypertension   . IBS (irritable bowel syndrome)   . Kidney stone   . Sickle cell anemia (HCC)    trait    Patient Active Problem List   Diagnosis Date Noted  . Acute pharyngitis 02/15/2017  . Neuropathy in diabetes ([place])   . Lumbosacral disc disease   . Hyperlipidemia 08/14/2010  . ANXIETY 08/14/2010  . Essential hypertension 08/14/2010  . RUQ PAIN 08/14/2010  . LACTOSE INTOLERANCE 08/13/2010  . SICKLE CELL TRAIT 08/13/2010  . GERD 08/13/2010  . IRRITABLE BOWEL SYNDROME 08/13/2010  . Allergic rhinitis 04/04/2007  . ASTHMA 04/04/2007    Past Surgical  History:  Procedure Laterality Date  . CARPAL TUNNEL RELEASE Right 03  . CARPAL TUNNEL RELEASE  08/20/2011   Procedure: CARPAL TUNNEL RELEASE;  Surgeon: Cammie Sickle., MD;  Location: [facility];  Service: Orthopedics;  Laterality: Left;  . CESAREAN SECTION  83,92   x 2  . COLONOSCOPY    . DIAGNOSTIC LAPAROSCOPY     ovarian cyst removal  . DILATION AND CURETTAGE OF UTERUS    . MASS EXCISION Right 07/03/2015   Procedure: EXCISION RIGHT LONG FINGER MASS;  Surgeon: Charlotte Crumb, MD;  Location: [facility];  Service: Orthopedics;  Laterality: Right;  . TUBAL LIGATION      OB History    No data available       Home Medications    Prior to Admission medications   Medication Sig Start Date End Date Taking? Authorizing Provider  amLODipine (NORVASC) 10 MG tablet take 1 tablet by mouth once daily 09/18/16  Yes Copland, Gay Filler, MD  fluticasone (FLONASE) 50 MCG/ACT nasal spray SPRAY 2 SPRAYS INTO EACH NOSTRIL EVERY DAY 04/26/17  Yes Delia Chimes A, MD  loratadine (CLARITIN) 10 MG tablet take 1 tablet by mouth once daily 10/02/16  Yes Stallings, Zoe A, MD  tamsulosin (FLOMAX) 0.4 MG CAPS capsule Take 1 capsule (0.4 mg total) by mouth daily. 12/30/16  Yes Katheren Shams,  DO  betamethasone dipropionate (DIPROLENE) 0.05 % ointment Apply topically 2 (two) times daily. Patient not taking: Reported on 05/28/2017 12/12/16   Tereasa Coop, PA-C  clotrimazole-betamethasone (LOTRISONE) cream Apply 1 application topically 2 (two) times daily. Patient not taking: Reported on 05/28/2017 12/03/16   Tereasa Coop, PA-C  ondansetron (ZOFRAN ODT) 4 MG disintegrating tablet Take 1 tablet (4 mg total) by mouth every 8 (eight) hours as needed for nausea or vomiting. 05/28/17   Neils Siracusa C, PA-C  oxyCODONE-acetaminophen (PERCOCET/ROXICET) 5-325 MG tablet Take 1-2 tablets by mouth every 6 (six) hours as needed for severe pain. 05/28/17   Halee Glynn C, PA-C    promethazine-dextromethorphan (PROMETHAZINE-DM) 6.25-15 MG/5ML syrup Take 5 mLs by mouth 4 (four) times daily as needed for cough. Patient not taking: Reported on 05/28/2017 02/15/17   Horald Pollen, MD  traMADol (ULTRAM) 50 MG tablet Take 2 tablets (100 mg total) by mouth every 8 (eight) hours as needed for moderate pain or severe pain. Patient not taking: Reported on 05/28/2017 12/30/16   Katheren Shams, DO    Family History Family History  Problem Relation Age of Onset  . Arthritis Mother   . Anemia Mother   . Asthma Mother   . Diabetes Sister   . Hypertension Father   . Alcohol abuse Father   . Heart disease Father 55       defibrillator and pacemaker  . Hyperlipidemia Father   . Stomach cancer Paternal Grandmother   . Multiple myeloma Maternal Grandmother   . Anesthesia problems Sister   . Diabetes Brother     Social History Social History  Substance Use Topics  . Smoking status: Former Smoker    Years: 8.00    Types: Cigarettes    Quit date: 07/13/2011  . Smokeless tobacco: Never Used  . Alcohol use 0.0 oz/week     Comment: OCCASIONALLY WINE     Allergies   Codeine; Erythromycin; and Sulfonamide derivatives   Review of Systems Review of Systems  Constitutional: Negative for fever.  Respiratory: Negative for shortness of breath.   Cardiovascular: Negative for chest pain.  Gastrointestinal: Positive for abdominal pain and nausea. Negative for constipation, diarrhea and vomiting.  Genitourinary: Positive for flank pain. Negative for difficulty urinating, vaginal bleeding and vaginal discharge.  Musculoskeletal: Positive for back pain.  All other systems reviewed and are negative.    Physical Exam Updated Vital Signs BP 135/82 (BP Location: Right Arm)   Pulse 71   Temp 97.7 F (36.5 C) (Oral)   Resp 16   SpO2 97%   Physical Exam  Constitutional: She appears well-developed and well-nourished. She appears distressed.  Appears intermittently  uncomfortable. Pain seems to come in waves.  HENT:  Head: Normocephalic and atraumatic.  Eyes: Conjunctivae are normal.  Neck: Neck supple.  Cardiovascular: Normal rate, regular rhythm, normal heart sounds and intact distal pulses.   Pulmonary/Chest: Effort normal and breath sounds normal. No respiratory distress.  Abdominal: Soft. There is tenderness (right flank). There is CVA tenderness (right). There is no guarding.  Musculoskeletal: She exhibits no edema.  Lymphadenopathy:    She has no cervical adenopathy.  Neurological: She is alert.  Skin: Skin is warm and dry. She is not diaphoretic.  Psychiatric: She has a normal mood and affect. Her behavior is normal.  Nursing note and vitals reviewed.    ED Treatments / Results  Labs (all labs ordered are listed, but only abnormal results are displayed) Labs Reviewed  URINALYSIS,  ROUTINE W REFLEX MICROSCOPIC - Abnormal; Notable for the following:       Result Value   Hgb urine dipstick SMALL (*)    Leukocytes, UA LARGE (*)    Bacteria, UA RARE (*)    Squamous Epithelial / LPF 0-5 (*)    Non Squamous Epithelial 0-5 (*)    All other components within normal limits  CBC WITH DIFFERENTIAL/PLATELET - Abnormal; Notable for the following:    WBC 10.9 (*)    Neutro Abs 8.2 (*)    All other components within normal limits  COMPREHENSIVE METABOLIC PANEL - Abnormal; Notable for the following:    Glucose, Bld 140 (*)    Creatinine, Ser 1.17 (*)    GFR calc non Af Amer 49 (*)    GFR calc Af Amer 57 (*)    All other components within normal limits  URINE CULTURE    EKG  EKG Interpretation None       Radiology Ct Renal Stone Study  Result Date: 05/28/2017 CLINICAL DATA:  Fluctuating right flank pain extending into the right abdomen for 48 hours. Nausea. EXAM: CT ABDOMEN AND PELVIS WITHOUT CONTRAST TECHNIQUE: Multidetector CT imaging of the abdomen and pelvis was performed following the standard protocol without IV contrast.  COMPARISON:  CT 12/01/2005. FINDINGS: Lower chest: There is a small thin-walled cyst at the right lung base and mild cylindrical bronchiectasis in both lower lobes. The lungs are otherwise clear. There is no significant pleural or pericardial effusion. Hepatobiliary: The liver appears unremarkable as imaged in the noncontrast state. No evidence of gallstones, gallbladder wall thickening or biliary dilatation. Pancreas: Unremarkable. No pancreatic ductal dilatation or surrounding inflammatory changes. Spleen: Normal in size without focal abnormality. Adrenals/Urinary Tract: Both adrenal glands appear normal. There is a nonobstructing 5 mm calculus in the lower pole of the right kidney. There is moderate right-sided hydronephrosis and hydroureter with associated asymmetric perinephric soft tissue stranding. The right ureter is dilated to the ureterovesical junction where there is an obstructing 5 mm calculus, best seen on coronal image 66. This is not well seen on the scout image. The left kidney and ureter appear unremarkable. The bladder appears normal. Stomach/Bowel: No evidence of bowel wall thickening, distention or surrounding inflammatory change. The appendix appears normal. Vascular/Lymphatic: No significant vascular findings on noncontrast imaging. There are no enlarged abdominopelvic lymph nodes. Reproductive: There are calcified uterine fibroids, measuring up to 4.6 cm in the anterior fundus. There is a smaller, exophytic partially calcified fibroid in the left adnexa. Adjacent to this, there is a 2.8 cm water density lesion on image 63 which is probably a cystic lesion in the left ovary or a hydrosalpinx. No suspicious right adnexal findings. Other: Small umbilical hernia containing only fat, stable. No ascites. Musculoskeletal: No acute or significant osseous findings. There are degenerative changes throughout the spine and both hips. 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 IMPRESSION: 1. Obstructing 5 mm calculus at the right ureterovesical junction. Nonobstructing right renal calculus. 2. Calcified uterine fibroids. 3. **An incidental finding of potential clinical significance has been found. Small cystic or tubular left adnexal structure, probably benign cyst. However, given the patient's age and presumed late postmenopausal status, further evaluation with pelvic ultrasound recommended. This recommendation follows ACR consensus guidelines: White Paper of the ACR Incidental Findings Committee II on Adnexal Findings. J Am Coll Radiol [phone].** 4. 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 Electronically Signed   By:  Richardean Sale M.D.   On: 05/28/2017 16:16    Procedures Procedures (including critical care time)  Medications Ordered in ED Medications  ketorolac (TORADOL) 30 MG/ML injection 30 mg (30 mg Intravenous Given 05/28/17 1257)  ondansetron (ZOFRAN) injection 4 mg (4 mg Intravenous Given 05/28/17 1257)  sodium chloride 0.9 % bolus 1,000 mL (0 mLs Intravenous Stopped 05/28/17 1613)  fentaNYL (SUBLIMAZE) injection 50 mcg (50 mcg Intravenous Given 05/28/17 1407)  sodium chloride 0.9 % bolus 1,000 mL (1,000 mLs Intravenous New Bag/Given 05/28/17 1614)  oxyCODONE-acetaminophen (PERCOCET/ROXICET) 5-325 MG per tablet 2 tablet (2 tablets Oral Given 05/28/17 1635)     Initial Impression / Assessment and Plan / ED Course  I have reviewed the triage vital signs and the nursing notes.  Pertinent labs & imaging results that were available during my care of the patient were reviewed by me and considered in my medical decision making (see chart for details).  Clinical Course as of May 29 1711  Fri May 28, 2017  1330 Patient states her pain is much more manageable after Toradol administration. Declines further pain management at this time.  [SJ]  2182 Patient was requesting more pain management. Discussed  options given her listed codeine allergy. She was informed that morphine would likely be ok, but that there is a small risk of cross-reactivity. Patient opts against further pain management at this time.  [SJ]  8833 VOUZH with Anylah from [facility] to inquire about the delay on the reading for the patient's CT renal stone study. She states that the CT techs on our end didn't mark the study as complete and ready to read. She will take care of calling them to get this done.  [SJ]  4604 NV states patient would now like something for pain. Patient is will to try narcotic pain medication. Will try percocet for dual purpose: Giving patient these medications in a controlled environment to assure no cross reactivity with her codeine allergy and to assure her pain can be controlled with oral medications.  [SJ]  1625 Discussed CT and lab results with the patient. Pt states she is comfortable with trying the percocet here in the ED. She was informed of symptoms to watch out for that would indicate worsening GU condition or allergic reaction from the percocet. She voices understanding.Incidental finding in the left adnexa was also discussed with the patient. Patient was given the option to have an ultrasound here or for this ordered to be placed outpatient. Patient voices desire for outpatient ultrasound. She will follow-up with OB/GYN on this matter.  [SJ]    Clinical Course User Index [SJ] Cruze Zingaro C, PA-C    Patient presents with right flank pain. Patient is nontoxic appearing, afebrile, not tachycardic, not tachypneic, not hypotensive, maintains SPO2 of 97% on room air, and is in no apparent distress. Patient has no signs of sepsis or other serious or life-threatening condition. 5 mm obstructing stone at the UVJ. Slight increase in creatinine, but without urinary retention. Patient able to tolerate PO. Pain able to be managed with oral medications. Urology follow-up. The patient was given instructions  for home care as well as return precautions. Patient voices understanding of these instructions, accepts the plan, and is comfortable with discharge.  Order placed for outpatient pelvic ultrasound, as noted in ED course.    Vitals:   05/28/17 1104 05/28/17 1639  BP: 135/82 (!) 146/89  Pulse: 71 77  Resp: 16 16  Temp: 97.7 F (36.5 C)  TempSrc: Oral   SpO2: 97% 97%      Final Clinical Impressions(s) / ED Diagnoses   Final diagnoses:  Kidney stone    New Prescriptions New Prescriptions   ONDANSETRON (ZOFRAN ODT) 4 MG DISINTEGRATING TABLET    Take 1 tablet (4 mg total) by mouth every 8 (eight) hours as needed for nausea or vomiting.   OXYCODONE-ACETAMINOPHEN (PERCOCET/ROXICET) 5-325 MG TABLET    Take 1-2 tablets by mouth every 6 (six) hours as needed for severe pain.     Lorayne Bender, PA-C 05/28/17 1714    Little, Wenda Overland, MD 05/29/17 2117

## 2017-05-28 NOTE — ED Triage Notes (Signed)
Pt is here with pain that comes in waves from right flank pain and radiates down into right abdomen for the last 48 hours.  Pt has had some nausea

## 2017-05-28 NOTE — ED Notes (Signed)
Pt agitated and verbalizing that she is not happy with her experience, this RN apologized for her experience, this RN attempted to explain the Triage process to the pt, this RN spoke with [place] AD, pt to be started in hallway bed in E, pt & family updated, pt & family continues to report dissatisfaction with this visit

## 2017-05-28 NOTE — Discharge Instructions (Signed)
You have evidence of a kidney stone on the right that appears to be on its way out, however, there are signs that it is obstructing the outflow of the right ureter. It can take up to 30 days for some kidney stones to pass, but we hope this isn't the case with you. In any event, you are advised to follow up with urology as soon as possible. Call the number provided to set up an appointment. Passing kidney stones are very painful, this pain is difficult to control, and complete pain control should not be an expected result. May use Tylenol for pain. Percocet for severe pain. Do not drive or perform other dangerous activities while taking the Percocet. May take Zofran for nausea.  There was an incidental finding in the left pelvis on the CT scan. This should be more closely evaluated by ultrasound. An order has been placed for a pelvic ultrasound. You will need to call the imaging center to set up an appointment. Please follow up with OBGYN on this matter as they will not release results directly to you.

## 2017-05-29 LAB — URINE CULTURE: Culture: NO GROWTH

## 2017-06-01 ENCOUNTER — Ambulatory Visit: Payer: BLUE CROSS/BLUE SHIELD | Admitting: Family Medicine

## 2017-06-01 ENCOUNTER — Ambulatory Visit (INDEPENDENT_AMBULATORY_CARE_PROVIDER_SITE_OTHER): Payer: BLUE CROSS/BLUE SHIELD | Admitting: Internal Medicine

## 2017-06-01 ENCOUNTER — Ambulatory Visit (INDEPENDENT_AMBULATORY_CARE_PROVIDER_SITE_OTHER)
Admission: RE | Admit: 2017-06-01 | Discharge: 2017-06-01 | Disposition: A | Discharge status: Home / Self Care | Payer: BLUE CROSS/BLUE SHIELD | Source: Ambulatory Visit | Attending: Internal Medicine | Admitting: Internal Medicine

## 2017-06-01 ENCOUNTER — Encounter: Payer: Self-pay | Admitting: Internal Medicine

## 2017-06-01 ENCOUNTER — Other Ambulatory Visit (INDEPENDENT_AMBULATORY_CARE_PROVIDER_SITE_OTHER): Payer: BLUE CROSS/BLUE SHIELD

## 2017-06-01 VITALS — BP 120/72 | HR 77 | Ht 66.0 in | Wt 198.6 lb

## 2017-06-01 DIAGNOSIS — R058 Other specified cough: Secondary | ICD-10-CM

## 2017-06-01 DIAGNOSIS — R05 Cough: Secondary | ICD-10-CM

## 2017-06-01 HISTORY — DX: Other specified cough: R05.8

## 2017-06-01 LAB — CBC WITH DIFFERENTIAL/PLATELET
BASOS PCT: 1.3 % (ref 0.0–3.0)
Basophils Absolute: 0.1 10*3/uL (ref 0.0–0.1)
EOS ABS: 0.2 10*3/uL (ref 0.0–0.7)
EOS PCT: 2.3 % (ref 0.0–5.0)
HCT: 40.6 % (ref 36.0–46.0)
HEMOGLOBIN: 13.1 g/dL (ref 12.0–15.0)
LYMPHS PCT: 36.4 % (ref 12.0–46.0)
Lymphs Abs: 2.6 10*3/uL (ref 0.7–4.0)
MCHC: 32.3 g/dL (ref 30.0–36.0)
MCV: 86 fl (ref 78.0–100.0)
Monocytes Absolute: 0.6 10*3/uL (ref 0.1–1.0)
Monocytes Relative: 8.2 % (ref 3.0–12.0)
Neutro Abs: 3.7 10*3/uL (ref 1.4–7.7)
Neutrophils Relative %: 51.8 % (ref 43.0–77.0)
Platelets: 242 10*3/uL (ref 150.0–400.0)
RBC: 4.72 Mil/uL (ref 3.87–5.11)
RDW: 14.5 % (ref 11.5–15.5)
WBC: 7.2 10*3/uL (ref 4.0–10.5)

## 2017-06-01 MED ORDER — FAMOTIDINE 20 MG PO TABS: ORAL_TABLET | ORAL | 2 refills | Status: DC

## 2017-06-01 MED ORDER — PREDNISONE 10 MG PO TABS: ORAL_TABLET | ORAL | 0 refills | Status: DC

## 2017-06-01 MED ORDER — PANTOPRAZOLE SODIUM 40 MG PO TBEC: 40.0000 mg | DELAYED_RELEASE_TABLET | Freq: Every day | ORAL | 2 refills | Status: DC

## 2017-06-01 NOTE — Patient Instructions (Addendum)

## 2017-06-01 NOTE — Progress Notes (Deleted)
No chief complaint on file.   HPI  Past Medical History:  Diagnosis Date  . Allergy   . Anemia   . Anxiety   . Arthritis   . Blood dyscrasia    sickle cell trait  . Complication of anesthesia not sure   oxygen dropped post ? hyst  . GERD (gastroesophageal reflux disease)   . Heart murmur   . Hypertension   . IBS (irritable bowel syndrome)   . Kidney stone   . Sickle cell anemia (HCC)    trait    Current Outpatient Prescriptions  Medication Sig Dispense Refill  . amLODipine (NORVASC) 10 MG tablet take 1 tablet by mouth once daily 90 tablet 3  . betamethasone dipropionate (DIPROLENE) 0.05 % ointment Apply topically 2 (two) times daily. (Patient not taking: Reported on 05/28/2017) 30 g 0  . clotrimazole-betamethasone (LOTRISONE) cream Apply 1 application topically 2 (two) times daily. (Patient not taking: Reported on 05/28/2017) 30 g 0  . fluticasone (FLONASE) 50 MCG/ACT nasal spray SPRAY 2 SPRAYS INTO EACH NOSTRIL EVERY DAY 16 g 0  . loratadine (CLARITIN) 10 MG tablet take 1 tablet by mouth once daily 30 tablet 11  . ondansetron (ZOFRAN ODT) 4 MG disintegrating tablet Take 1 tablet (4 mg total) by mouth every 8 (eight) hours as needed for nausea or vomiting. 20 tablet 0  . oxyCODONE-acetaminophen (PERCOCET/ROXICET) 5-325 MG tablet Take 1-2 tablets by mouth every 6 (six) hours as needed for severe pain. 20 tablet 0  . promethazine-dextromethorphan (PROMETHAZINE-DM) 6.25-15 MG/5ML syrup Take 5 mLs by mouth 4 (four) times daily as needed for cough. (Patient not taking: Reported on 05/28/2017) 118 mL 0  . tamsulosin (FLOMAX) 0.4 MG CAPS capsule Take 1 capsule (0.4 mg total) by mouth daily. 30 capsule 0  . traMADol (ULTRAM) 50 MG tablet Take 2 tablets (100 mg total) by mouth every 8 (eight) hours as needed for moderate pain or severe pain. (Patient not taking: Reported on 05/28/2017) 30 tablet 0   No current facility-administered medications for this visit.     Allergies:  Allergies    Allergen Reactions  . Codeine     Hives   . Erythromycin     hives  . Sulfonamide Derivatives     swelling    Past Surgical History:  Procedure Laterality Date  . CARPAL TUNNEL RELEASE Right 03  . CARPAL TUNNEL RELEASE  08/20/2011   Procedure: CARPAL TUNNEL RELEASE;  Surgeon: Cammie Sickle., MD;  Location: [facility];  Service: Orthopedics;  Laterality: Left;  . CESAREAN SECTION  83,92   x 2  . COLONOSCOPY    . DIAGNOSTIC LAPAROSCOPY     ovarian cyst removal  . DILATION AND CURETTAGE OF UTERUS    . MASS EXCISION Right 07/03/2015   Procedure: EXCISION RIGHT LONG FINGER MASS;  Surgeon: Charlotte Crumb, MD;  Location: [facility];  Service: Orthopedics;  Laterality: Right;  . TUBAL LIGATION      Social History   Social History  . Marital status: Widowed    Spouse name: N/A  . Number of children: 2  . Years of education: N/A   Occupational History  . insurance agent/broker [employer] History Main Topics  . Smoking status: Former Smoker    Years: 8.00    Types: Cigarettes    Quit date: 07/13/2011  . Smokeless tobacco: Never Used  . Alcohol use 0.0 oz/week     Comment: OCCASIONALLY  WINE  . Drug use: No  . Sexual activity: Not Currently    Birth control/ protection: Post-menopausal   Other Topics Concern  . Not on file   Social History Narrative  . No narrative on file    ROS  Objective: There were no vitals filed for this visit.  Physical Exam  Assessment and Plan There are no diagnoses linked to this encounter.   Rachael Garcia

## 2017-06-01 NOTE — Progress Notes (Signed)
Subjective:     Patient ID: Rachael Garcia, female   DOB: [birth year],     MRN: [id number]  HPI    Zoe  Creta Levin is pcp   19 yobf quit smoking 2012 with h/o rhinitis  year round but worse spring = fall onset in her 70's on allergy shots for several years s much benefit and some tendency to bronchitis  Better p quit smoking and maintained on flonase  then indolent onset of persistent daily cough x 11/2016 assoc with sore throat waxed and waned but persistent daily symptoms >  ENT eval 04/06/17 c/w reflux rx ppi otc and seemed some better p 14 days with sore throat def improved but not the cough so self referred to pulmonary clinic 06/01/2017 .   06/01/2017 1st [facility] office visit/ Delsa Walder   Chief Complaint  Patient presents with  . Advice Only    Pt has had a cough x2 months with occ. green-gray mucus. Pt states that with her cough, it feels like something is getting stuck in her throat. Denies any SOB or CP.  not much mucus production  and more day > noct symptoms clariton helped the nasal symptoms but not the cough / sensation of globus. Prednisone may have helped transiently   Not limited by breathing from desired activities    No obvious day to day or daytime variability or assoc  mucus plugs or hemoptysis or cp or chest tightness, subjective wheeze or overt sinus or hb symptoms. No unusual exp hx or h/o childhood pna/ asthma or knowledge of premature birth.  Sleeping ok without nocturnal  or early am exacerbation  of respiratory  c/o's or need for noct saba. Also denies any obvious fluctuation of symptoms with weather or environmental changes or other aggravating or alleviating factors except as outlined above   Current Medications, Allergies, Complete Past Medical History, Past Surgical History, Family History, and Social History were reviewed in [employer] record.  ROS  The following are not active complaints unless bolded sore throat, dysphagia, dental  problems, itching, sneezing,  nasal congestion or excess/ purulent secretions, ear ache,   fever, chills, sweats, unintended wt loss, classically pleuritic or exertional cp,  orthopnea pnd or leg swelling, presyncope, palpitations, abdominal pain, anorexia, nausea, vomiting, diarrhea  or change in bowel or bladder habits, change in stools or urine, dysuria,hematuria,  rash, arthralgias, visual complaints, headache, numbness, weakness or ataxia or problems with walking or coordination,  change in mood/affect or memory.         Review of Systems     Objective:   Physical Exam amb somber bf nad   Wt Readings from Last 3 Encounters:  06/01/17 198 lb 9.6 oz (90.1 kg)  02/15/17 199 lb 9.6 oz (90.5 kg)  12/30/16 202 lb (91.6 kg)    Vital signs reviewed - Note on arrival 02 sats  97% on RA    HEENT: nl dentition, turbinates bilaterally, and oropharynx. Nl external ear canals without cough reflex   NECK :  without JVD/Nodes/TM/ nl carotid upstrokes bilaterally   LUNGS: no acc muscle use,  Nl contour chest which is clear to A and P bilaterally without cough on insp or exp maneuvers   CV:  RRR  no s3 or murmur or increase in P2, and no edema   ABD:  soft and nontender with nl inspiratory excursion in the supine position. No bruits or organomegaly appreciated, bowel sounds nl  MS:  Nl gait/ ext warm  without deformities, calf tenderness, cyanosis or clubbing No obvious joint restrictions   SKIN: warm and dry without lesions    NEURO:  alert, approp, nl sensorium with  no motor or cerebellar deficits apparent.        Labs ordered 06/01/2017    CXR PA and Lateral:   06/01/2017 :    I personally reviewed images and agree with radiology impression as follows:         Assessment:

## 2017-06-01 NOTE — Assessment & Plan Note (Addendum)
Max rx for gerd plus 1st gen h1 06/01/2017 >>>  - Allergy profile 06/01/2017 >  Eos 0. /  IgE    The most common causes of chronic cough in immunocompetent adults include the following: upper airway cough syndrome (UACS), previously referred to as postnasal drip syndrome (PNDS), which is caused by variety of rhinosinus conditions; (2) asthma; (3) GERD; (4) chronic bronchitis from cigarette smoking or other inhaled environmental irritants; (5) nonasthmatic eosinophilic bronchitis; and (6) bronchiectasis.   These conditions, singly or in combination, have accounted for up to 94% of the causes of chronic cough in prospective studies.   Other conditions have constituted no >6% of the causes in prospective studies These have included bronchogenic carcinoma, chronic interstitial pneumonia, sarcoidosis, left ventricular failure, ACEI-induced cough, and aspiration from a condition associated with pharyngeal dysfunction.    Chronic cough is often simultaneously caused by more than one condition. A single cause has been found from 38 to 82% of the time, multiple causes from 18 to 62%. Multiply caused cough has been the result of three diseases up to 42% of the time.       Most likely this is Upper airway cough syndrome (previously labeled PNDS) , is  so named because it's frequently impossible to sort out how much is  CR/sinusitis with freq throat clearing (which can be related to primary GERD)   vs  causing  secondary (" extra esophageal")  GERD from wide swings in gastric pressure that occur with throat clearing, often  promoting self use of mint and menthol lozenges that reduce the lower esophageal sphincter tone and exacerbate the problem further in a cyclical fashion.   These are the same pts (now being labeled as having "irritable larynx syndrome" by some cough centers) who not infrequently have a history of having failed to tolerate ace inhibitors,  dry powder inhalers or biphosphonates or report having  atypical/extraesophageal reflux symptoms that don't respond to standard doses of PPI  and are easily confused as having aecopd or asthma flares by even experienced allergists/ pulmonologists (myself included).   Of the three most common causes of  Sub-acute or recurrent or chronic cough, only one (GERD)  can actually contribute to/ trigger  the other two (asthma and post nasal drip syndrome)  and perpetuate the cylce of cough.  While not intuitively obvious, many patients with chronic low grade reflux do not cough until there is a primary insult that disturbs the protective epithelial barrier and exposes sensitive nerve endings.   This is typically viral but can be direct physical injury such as with an endotracheal tube.   The point is that once this occurs, it is difficult to eliminate the cycle  using anything but a maximally effective acid suppression regimen at least in the short run, accompanied by an appropriate diet to address non acid GERD and control / eliminate the cough itself for at least 3 days.    Try max rx for gerd x 6 weeks and eliminate pnds with 1st gen h1 with allergy profile in meantime and only very short course of pred to judge response then  consider sinus CT and methacholine challenge next    Total time devoted to counseling  > 50 % of initial 60 min office visit:  review case with pt/ discussion of options/alternatives/ personally creating written customized instructions  in presence of pt  then going over those specific  Instructions directly with the pt including how to use all of the meds but  in particular covering each new medication in detail and the difference between the maintenance= "automatic" meds and the prns using an action plan format for the latter (If this problem/symptom => do that organization reading Left to right).  Please see AVS from this visit for a full list of these instructions which I personally wrote for this pt and  are unique to this visit.

## 2017-06-02 ENCOUNTER — Encounter: Payer: Self-pay | Admitting: Physician Assistant

## 2017-06-02 ENCOUNTER — Ambulatory Visit (INDEPENDENT_AMBULATORY_CARE_PROVIDER_SITE_OTHER): Payer: BLUE CROSS/BLUE SHIELD | Admitting: Physician Assistant

## 2017-06-02 VITALS — BP 146/65 | HR 83 | Temp 98.4°F | Resp 18 | Ht 66.0 in | Wt 198.4 lb

## 2017-06-02 DIAGNOSIS — N949 Unspecified condition associated with female genital organs and menstrual cycle: Secondary | ICD-10-CM

## 2017-06-02 DIAGNOSIS — N9489 Other specified conditions associated with female genital organs and menstrual cycle: Secondary | ICD-10-CM

## 2017-06-02 LAB — RESPIRATORY ALLERGY PROFILE REGION II ~~LOC~~
Allergen, A. alternata, m6: <0.1 kU/L
Allergen, Cedar tree, t12: 0.21 kU/L — ABNORMAL HIGH
Allergen, D pternoyssinus,d7: <0.1 kU/L
Allergen, Mulberry, t76: <0.1 kU/L
Allergen, Oak,t7: <0.1 kU/L
Allergen, P. notatum, m1: <0.1 kU/L
Aspergillus fumigatus, m3: <0.1 kU/L
Bermuda Grass: 0.12 kU/L — ABNORMAL HIGH
Cat Dander: 0.33 kU/L — ABNORMAL HIGH
Cockroach: <0.1 kU/L
Elm IgE: 0.12 kU/L — ABNORMAL HIGH
IgE (Immunoglobulin E), Serum: 64 kU/L (ref <115)
Johnson Grass: 0.15 kU/L — ABNORMAL HIGH
Pecan/Hickory Tree IgE: 0.4 kU/L — ABNORMAL HIGH
Rough Pigweed  IgE: <0.1 kU/L
Sheep Sorrel IgE: <0.1 kU/L
TIMOTHY GRASS: 0.29 kU/L — AB

## 2017-06-02 NOTE — Progress Notes (Signed)
Spoke with pt and notified of results per Dr. Wert. Pt verbalized understanding and denied any questions. 

## 2017-06-02 NOTE — Progress Notes (Signed)
06/02/2017 5:33 PM   DOB: Aug 17, 1956 / MRN: [id number]  SUBJECTIVE:  Rachael Garcia is a 61 y.o. female presenting for a referral.  She was in the ED last Friday for a kidney stone which has resolved. Notes that she had an incidental reproductive finding on CT scan and was advised to follow up here.  CT scan is included below.  She denies weight loss and vaginal bleeding today.  She has a history of fibroids and ovarian cyst.   She is allergic to codeine; erythromycin; and sulfonamide derivatives.   She  has a past medical history of Allergy; Anemia; Anxiety; Arthritis; Blood dyscrasia; Complication of anesthesia (not sure); GERD (gastroesophageal reflux disease); Heart murmur; Hypertension; IBS (irritable bowel syndrome); Kidney stone; and Sickle cell anemia ([place]).    She  reports that she quit smoking about 5 years ago. Her smoking use included Cigarettes. She quit after 8.00 years of use. She has never used smokeless tobacco. She reports that she drinks alcohol. She reports that she does not use drugs. She  reports that she does not currently engage in sexual activity. She reports using the following method of birth control/protection: Post-menopausal. The patient  has a past surgical history that includes Tubal ligation; Dilation and curettage of uterus; Colonoscopy; Diagnostic laparoscopy; Cesarean section (50,38); Carpal tunnel release (Right, 03); Carpal tunnel release (08/20/2011); and Mass excision (Right, 07/03/2015).  Her family history includes Alcohol abuse in her father; Anemia in her mother; Anesthesia problems in her sister; Arthritis in her mother; Asthma in her mother; Diabetes in her brother and sister; Heart disease (age of onset: 48) in her father; Hyperlipidemia in her father; Hypertension in her father; Multiple myeloma in her maternal grandmother; Stomach cancer in her paternal grandmother.  Review of Systems  Constitutional: Negative for chills, diaphoresis and fever.  Eyes:  Negative.   Respiratory: Negative for cough, hemoptysis, sputum production, shortness of breath and wheezing.   Cardiovascular: Negative for chest pain, orthopnea and leg swelling.  Gastrointestinal: Negative for nausea.  Skin: Negative for rash.  Neurological: Negative for dizziness, sensory change, speech change, focal weakness and headaches.    The problem list and medications were reviewed and updated by myself where necessary and exist elsewhere in the encounter.   OBJECTIVE:  BP (!) 146/65   Pulse 83   Temp 98.4 F (36.9 C) (Oral)   Resp 18   Ht '5\' 6"'  (1.676 m)   Wt 198 lb 6.4 oz (90 kg)   SpO2 95%   BMI 32.02 kg/m   Wt Readings from Last 3 Encounters:  06/02/17 198 lb 6.4 oz (90 kg)  06/01/17 198 lb 9.6 oz (90.1 kg)  02/15/17 199 lb 9.6 oz (90.5 kg)     Physical Exam  Constitutional: She is active.  Non-toxic appearance.  Cardiovascular: Normal rate, S1 normal and S2 normal.  Exam reveals no decreased pulses.   Pulmonary/Chest: Effort normal. No tachypnea.  Abdominal: Soft. Normal appearance and bowel sounds are normal. She exhibits no distension and no mass. There is no tenderness. There is no rigidity, no rebound, no guarding and no CVA tenderness.  Neurological: She is alert.  Skin: Skin is warm and dry. No pallor.    No results found.  ASSESSMENT AND PLAN:  Viktoria was seen today for referral.  Diagnoses and all orders for this visit:  Adnexal mass: Will follow incidentaloma per radiologist recommendation.  She has no pain, weight loss, or vaginal bleeding today.  -  US Pelvis Complete; Future    The patient is advised to call or return to clinic if she does not see an improvement in symptoms, or to seek the care of the closest emergency department if she worsens with the above plan.   Philis Fendt, MHS, PA-C [facility] 06/02/2017 5:33 PM

## 2017-06-02 NOTE — Patient Instructions (Signed)
     IF you received an x-ray today, you will receive an invoice from [facility]. Please contact  Radiology at [phone] with questions or concerns regarding your invoice.   IF you received labwork today, you will receive an invoice from [facility]. Please contact [facility] at [phone] with questions or concerns regarding your invoice.   Our billing staff will not be able to assist you with questions regarding bills from these companies.  You will be contacted with the lab results as soon as they are available. The fastest way to get your results is to activate your My Chart account. Instructions are located on the last page of this paperwork. If you have not heard from us regarding the results in 2 weeks, please contact this office.     

## 2017-06-10 ENCOUNTER — Ambulatory Visit
Admission: RE | Admit: 2017-06-10 | Discharge: 2017-06-10 | Disposition: A | Discharge status: Home / Self Care | Payer: BLUE CROSS/BLUE SHIELD | Source: Ambulatory Visit | Attending: Physician Assistant | Admitting: Physician Assistant

## 2017-06-10 ENCOUNTER — Other Ambulatory Visit: Payer: Self-pay | Admitting: Physician Assistant

## 2017-06-10 ENCOUNTER — Ambulatory Visit
Admission: RE | Admit: 2017-06-10 | Discharge: 2017-06-10 | Disposition: A | Discharge status: Home / Self Care | Payer: BLUE CROSS/BLUE SHIELD | Source: Ambulatory Visit | Attending: Emergency Medicine | Admitting: Emergency Medicine

## 2017-06-10 DIAGNOSIS — N9489 Other specified conditions associated with female genital organs and menstrual cycle: Secondary | ICD-10-CM

## 2017-06-10 DIAGNOSIS — N949 Unspecified condition associated with female genital organs and menstrual cycle: Secondary | ICD-10-CM

## 2017-06-15 ENCOUNTER — Encounter: Payer: Self-pay | Admitting: Gynecologic Oncology

## 2017-06-15 ENCOUNTER — Telehealth: Payer: Self-pay | Admitting: Gynecologic Oncology

## 2017-06-15 NOTE — Telephone Encounter (Signed)
Appt has been scheduled for the pt to see Dr. Denman George on 9/10 at 9am. Left the appt date and time on the pt's vm. Letter mailed.

## 2017-06-16 ENCOUNTER — Telehealth: Payer: Self-pay | Admitting: Radiology

## 2017-06-16 NOTE — Telephone Encounter (Signed)
At the patient's request, I made a copy of her x ray on CD, and placed in the pick up drawer.  

## 2017-06-18 ENCOUNTER — Encounter: Payer: Self-pay | Admitting: Gynecology

## 2017-06-18 ENCOUNTER — Ambulatory Visit (INDEPENDENT_AMBULATORY_CARE_PROVIDER_SITE_OTHER): Payer: BLUE CROSS/BLUE SHIELD | Admitting: Gynecology

## 2017-06-18 VITALS — BP 136/80 | Ht 65.0 in | Wt 193.0 lb

## 2017-06-18 DIAGNOSIS — N83202 Unspecified ovarian cyst, left side: Secondary | ICD-10-CM

## 2017-06-18 DIAGNOSIS — D259 Leiomyoma of uterus, unspecified: Secondary | ICD-10-CM | POA: Diagnosis not present

## 2017-06-18 NOTE — Progress Notes (Signed)
Thank you for seeing her.  Philis Fendt, MS, PA-C 1:44 PM, 06/18/2017

## 2017-06-18 NOTE — Progress Notes (Signed)
    Rachael Garcia July 19, 1956 [id number]        61 y.o.  [id number] new patient seen in referral due to evaluation for pain with CT scan showing renal lithiasis and as an incidental finding a left adnexal cystic mass. No evidence of ascites or pelvic lymphadenopathy. She had a follow up vaginal ultrasound which showed several small calcified myomas and a 5 cm multiseptated left cyst with suggestion of internal blood flow.  No recent studies such as ultrasounds or CT scans per patient history of the past several years. She's having no pain referrable to the left side. Has known that she's had leiomyoma for years. No significant history of gynecologic issues in the past. Last Pap smear 09/2015 negative. Patient actually has an appointment to see Dr. Everitt Amber this coming Monday already scheduled.  Past medical history,surgical history, problem list, medications, allergies, family history and social history were all reviewed and documented in the EPIC chart.  Directed ROS with pertinent positives and negatives documented in the history of present illness/assessment and plan.  Exam: [name] assistant Vitals:   06/18/17 0901  BP: 136/80  Weight: 193 lb (87.5 kg)  Height: 5\' 5"  (1.651 m)   General appearance:  Normal Abdomen soft nontender without masses guarding rebound External BUS vagina with atrophic changes. Cervix with atrophic changes. Uterus grossly normal midline mobile nontender. Adnexa without gross masses or tenderness.  Assessment/Plan:  61 y.o. [id number] with multiseptated left adnexal mass 5 cm as incidental finding when being evaluated for renal lithiasis. Some suggestion of internal blood flow. Reviewed differential with the patient to include both benign and malignant possibilities. Unclear how long she's had this area as she's had no studies such as ultrasounds or CT scans in the past several years. I did discuss given the multiseptated nature and possible blood flow internally the  increased risk of carcinoma. Patient already has an appointment with Dr. Everitt Amber which I think is the most appropriate. I ordered a CA-125 today as baseline to have available for Dr. Denman George when she sees her beginning of next week.  Greater than 50% of my time was spent in direct face to face counseling and coordination of care with the patient.     Anastasio Auerbach MD, 9:56 AM 06/18/2017

## 2017-06-18 NOTE — Patient Instructions (Signed)
Follow up for your appointment with Dr. Denman George as scheduled.

## 2017-06-21 ENCOUNTER — Encounter: Payer: Self-pay | Admitting: Gynecologic Oncology

## 2017-06-21 ENCOUNTER — Ambulatory Visit: Payer: BLUE CROSS/BLUE SHIELD | Attending: Gynecologic Oncology | Admitting: Gynecologic Oncology

## 2017-06-21 VITALS — BP 154/97 | HR 85 | Temp 97.5°F | Resp 20 | Ht 66.0 in | Wt 193.0 lb

## 2017-06-21 DIAGNOSIS — N83202 Unspecified ovarian cyst, left side: Secondary | ICD-10-CM | POA: Diagnosis not present

## 2017-06-21 DIAGNOSIS — F419 Anxiety disorder, unspecified: Secondary | ICD-10-CM | POA: Diagnosis not present

## 2017-06-21 DIAGNOSIS — K219 Gastro-esophageal reflux disease without esophagitis: Secondary | ICD-10-CM | POA: Diagnosis not present

## 2017-06-21 DIAGNOSIS — I10 Essential (primary) hypertension: Secondary | ICD-10-CM | POA: Diagnosis not present

## 2017-06-21 DIAGNOSIS — D573 Sickle-cell trait: Secondary | ICD-10-CM | POA: Diagnosis not present

## 2017-06-21 DIAGNOSIS — N83209 Unspecified ovarian cyst, unspecified side: Secondary | ICD-10-CM | POA: Insufficient documentation

## 2017-06-21 DIAGNOSIS — K589 Irritable bowel syndrome without diarrhea: Secondary | ICD-10-CM | POA: Insufficient documentation

## 2017-06-21 DIAGNOSIS — D259 Leiomyoma of uterus, unspecified: Secondary | ICD-10-CM | POA: Insufficient documentation

## 2017-06-21 DIAGNOSIS — Z79899 Other long term (current) drug therapy: Secondary | ICD-10-CM | POA: Diagnosis not present

## 2017-06-21 DIAGNOSIS — Z882 Allergy status to sulfonamides status: Secondary | ICD-10-CM | POA: Insufficient documentation

## 2017-06-21 DIAGNOSIS — Z87891 Personal history of nicotine dependence: Secondary | ICD-10-CM | POA: Insufficient documentation

## 2017-06-21 LAB — CA 125: CA 125: 9 U/mL (ref <35)

## 2017-06-21 NOTE — Progress Notes (Signed)
Consult Note: Gyn-Onc  Consult was requested by Dr. Phineas Real for the evaluation of Rachael Garcia 61 y.o. female  CC:  Chief Complaint  Patient presents with  . Cyst of ovary, unspecified laterality    Assessment/Plan:  [name]  is a 61 y.o.  year old with a left ovarian cyst (5cm). CA 125 is pending.  The cyst is most likely benign based on its appearance. I offered Rachael Garcia the option of serial imaging with CA 125 assessments vs surgical exploration with BSO, possible hysterectomy and staging (if malignancy is identified). The patient is very anxious about the possibility of cancer and is electing for definitive management with BSO. I think that this is reasonable given that she has an acceptable surgical risk profile. I explained thought that there remain risks with surgery including  bleeding, infection, damage to internal organs (such as bladder,ureters, bowels), blood clot, reoperation and rehospitalization.  She would like to proceed with a surgical dates as soon as possible and is scheduled for next week.   HPI: Rachael Garcia is a 61 yr old P2 who is seen in consultation at the request of Dr Phineas Real for a left ovarian cyst.  The patient was seen in the ED on 05/28/17 for flank pain from a kidney stone. A renal stone study CT was ordered and an incidental finding of a left ovarian cyst was identified in addition to fibroids in the uterus.  On 06/10/17 she had a follow-up US which confirmed a uterus measuring 8.1x4.3x7.1cm with a 3cm calcified fundal fibroid, the right ovary was not visualized. The left ovary was replaced by a complex cystic mass measuring 5x2.4x2.1cm with multiple septations and mild internal blood flow. There was an additional cystic area 2cm in size adjacent which appeared to be a dilated fallopian tube.  The patient had a CA 125 drawn on 06/18/17 which is not yet resulted at the time of this visit.  She is otherwise fairly healthy with  hypertension. She had menopause at 64 with no subsequent bleeding. Her only prior abdominal surgeries were 2 c/s's and a laparoscopy for an ovarian cyst. She has no significant family history (for breast/ovarian cancer).  Current Meds:  Outpatient Encounter Prescriptions as of 06/21/2017  Medication Sig  . amLODipine (NORVASC) 10 MG tablet take 1 tablet by mouth once daily  . famotidine (PEPCID) 20 MG tablet One at bedtime  . fluticasone (FLONASE) 50 MCG/ACT nasal spray SPRAY 2 SPRAYS INTO EACH NOSTRIL EVERY DAY  . pantoprazole (PROTONIX) 40 MG tablet Take 1 tablet (40 mg total) by mouth daily. Take 30-60 min before first meal of the day   No facility-administered encounter medications on file as of 06/21/2017.     Allergy:  Allergies  Allergen Reactions  . Codeine     Hives   . Erythromycin     hives  . Sulfonamide Derivatives     swelling    Social Hx:   Social History   Social History  . Marital status: Widowed    Spouse name: N/A  . Number of children: 2  . Years of education: N/A   Occupational History  . insurance agent/broker [place] History Main Topics  . Smoking status: Former Smoker    Years: 8.00    Types: Cigarettes    Quit date: 07/13/2011  . Smokeless tobacco: Never Used  . Alcohol use 0.0 oz/week     Comment: OCCASIONALLY WINE  . Drug use: No  .  Sexual activity: Not Currently    Birth control/ protection: Post-menopausal     Comment: 1st intercourse 61 yo-More than 5 partners   Other Topics Concern  . Not on file   Social History Narrative  . No narrative on file    Past Surgical Hx:  Past Surgical History:  Procedure Laterality Date  . CARPAL TUNNEL RELEASE Right 03  . CARPAL TUNNEL RELEASE  08/20/2011   Procedure: CARPAL TUNNEL RELEASE;  Surgeon: Cammie Sickle., MD;  Location: [facility];  Service: Orthopedics;  Laterality: Left;  . CESAREAN SECTION  83,92   x 2  .  COLONOSCOPY    . DIAGNOSTIC LAPAROSCOPY     ovarian cyst removal  . DILATION AND CURETTAGE OF UTERUS    . MASS EXCISION Right 07/03/2015   Procedure: EXCISION RIGHT LONG FINGER MASS;  Surgeon: Charlotte Crumb, MD;  Location: [facility];  Service: Orthopedics;  Laterality: Right;  . TUBAL LIGATION      Past Medical Hx:  Past Medical History:  Diagnosis Date  . Allergy   . Anemia   . Anxiety   . Arthritis   . Blood dyscrasia    sickle cell trait  . Complication of anesthesia not sure   oxygen dropped post ? hyst  . GERD (gastroesophageal reflux disease)   . Heart murmur   . Hypertension   . IBS (irritable bowel syndrome)   . Kidney stone     Past Gynecological History:  C/s x 2. Ovarian cyst surgery No LMP recorded. Patient is postmenopausal.  Family Hx:  Family History  Problem Relation Age of Onset  . Arthritis Mother   . Anemia Mother   . Hypertension Mother   . Diabetes Sister   . Hypertension Father   . Alcohol abuse Father   . Heart disease Father 80       defibrillator and pacemaker  . Hyperlipidemia Father   . Stomach cancer Paternal Grandmother   . Multiple myeloma Maternal Grandmother   . Stroke Sister   . Diabetes Brother   . Cancer Brother        Throat    Review of Systems:  Constitutional  Feels well,    ENT Normal appearing ears and nares bilaterally Skin/Breast  No rash, sores, jaundice, itching, dryness Cardiovascular  No chest pain, shortness of breath, or edema  Pulmonary  No cough or wheeze.  Gastro Intestinal  No nausea, vomitting, or diarrhoea. No bright red blood per rectum, no abdominal pain, change in bowel movement, or constipation.  Genito Urinary  No frequency, urgency, dysuria, no pain or bleeding Musculo Skeletal  No myalgia, arthralgia, joint swelling or pain  Neurologic  No weakness, numbness, change in gait,  Psychology  No depression, anxiety, insomnia.   Vitals:  Blood pressure (!) 154/97, pulse  85, temperature (!) 97.5 F (36.4 C), temperature source Oral, resp. rate 20, height _0  (1.676 m), weight 193 lb (87.5 kg), SpO2 98 %.  Physical Exam: WD in NAD Neck  Supple NROM, without any enlargements.  Lymph Node Survey No cervical supraclavicular or inguinal adenopathy Cardiovascular  Pulse normal rate, regularity and rhythm. S1 and S2 normal.  Lungs  Clear to auscultation bilateraly, without wheezes/crackles/rhonchi. Good air movement.  Skin  No rash/lesions/breakdown  Psychiatry  Alert and oriented to person, place, and time  Abdomen  Normoactive bowel sounds, abdomen soft, non-tender and nonobese without evidence of hernia.  Back No CVA tenderness Genito Urinary  Vulva/vagina:  Normal external female genitalia.   No lesions. No discharge or bleeding.  Bladder/urethra:  No lesions or masses, well supported bladder  Vagina: normal  Cervix: Normal appearing, no lesions.  Uterus: bulky, 10cm, mobile, no parametrial involvement or nodularity.  Adnexa: no discretely palpable masses. Rectal  Good tone, no masses no cul de sac nodularity.  Extremities  No bilateral cyanosis, clubbing or edema.   Donaciano Eva, MD  06/21/2017, 11:09 AM

## 2017-06-21 NOTE — Patient Instructions (Addendum)
Preparing for your Surgery  Plan for surgery on Sept 20, 2018 with Dr. Dr. Janie Morning at [facility] will be scheduled for a robotic assisted bilateral salpingo-oophorectomy (removal of both tubes and ovaries), possible hysterectomy, possible staging if a cancer is identified.  Pre-operative Testing -You will receive a phone call from presurgical testing at [facility] to arrange for a pre-operative testing appointment before your surgery.  This appointment normally occurs one to two weeks before your scheduled surgery.   -Bring your insurance card, copy of an advanced directive if applicable, medication list  -At that visit, you will be asked to sign a consent for a possible blood transfusion in case a transfusion becomes necessary during surgery.  The need for a blood transfusion is rare but having consent is a necessary part of your care.     -You should not be taking blood thinners or aspirin at least ten days prior to surgery unless instructed by your surgeon.  Day Before Surgery at [facility] will be asked to take in a light diet the day before surgery.  Avoid carbonated beverages.  You will be advised to have nothing to eat or drink after midnight the evening before.     Eat a light diet the day before surgery.  Examples including soups, broths, toast, yogurt, mashed potatoes.  Things to avoid include carbonated beverages (fizzy beverages), raw fruits and raw vegetables, or beans.    If your bowels are filled with gas, your surgeon will have difficulty visualizing your pelvic organs which increases your surgical risks.  Your role in recovery Your role is to become active as soon as directed by your doctor, while still giving yourself time to heal.  Rest when you feel tired. You will be asked to do the following in order to speed your recovery:  - Cough and breathe deeply. This helps toclear and expand your lungs and can prevent pneumonia. You may  be given a spirometer to practice deep breathing. A staff member will show you how to use the spirometer. - Do mild physical activity. Walking or moving your legs help your circulation and body functions return to normal. A staff member will help you when you try to walk and will provide you with simple exercises. Do not try to get up or walk alone the first time. - Actively manage your pain. Managing your pain lets you move in comfort. We will ask you to rate your pain on a scale of zero to 10. It is your responsibility to tell your doctor or nurse where and how much you hurt so your pain can be treated.  Special Considerations -If you are diabetic, you may be placed on insulin after surgery to have closer control over your blood sugars to promote healing and recovery.  This does not mean that you will be discharged on insulin.  If applicable, your oral antidiabetics will be resumed when you are tolerating a solid diet.  -Your final pathology results from surgery should be available by the Friday after surgery and the results will be relayed to you when available.   Blood Transfusion Information WHAT IS A BLOOD TRANSFUSION? A transfusion is the replacement of blood or some of its parts. Blood is made up of multiple cells which provide different functions.  Red blood cells carry oxygen and are used for blood loss replacement.  White blood cells fight against infection.  Platelets control bleeding.  Plasma helps clot blood.  Other  blood products are available for specialized needs, such as hemophilia or other clotting disorders. BEFORE THE TRANSFUSION  Who gives blood for transfusions?   You may be able to donate blood to be used at a later date on yourself (autologous donation).  Relatives can be asked to donate blood. This is generally not any safer than if you have received blood from a stranger. The same precautions are taken to ensure safety when a relative's blood is  donated.  Healthy volunteers who are fully evaluated to make sure their blood is safe. This is blood bank blood. Transfusion therapy is the safest it has ever been in the practice of medicine. Before blood is taken from a donor, a complete history is taken to make sure that person has no history of diseases nor engages in risky social behavior (examples are intravenous drug use or sexual activity with multiple partners). The donor's travel history is screened to minimize risk of transmitting infections, such as malaria. The donated blood is tested for signs of infectious diseases, such as HIV and hepatitis. The blood is then tested to be sure it is compatible with you in order to minimize the chance of a transfusion reaction. If you or a relative donates blood, this is often done in anticipation of surgery and is not appropriate for emergency situations. It takes many days to process the donated blood. RISKS AND COMPLICATIONS Although transfusion therapy is very safe and saves many lives, the main dangers of transfusion include:   Getting an infectious disease.  Developing a transfusion reaction. This is an allergic reaction to something in the blood you were given. Every precaution is taken to prevent this. The decision to have a blood transfusion has been considered carefully by your caregiver before blood is given. Blood is not given unless the benefits outweigh the risks.

## 2017-06-23 ENCOUNTER — Encounter (HOSPITAL_COMMUNITY)
Admission: RE | Admit: 2017-06-23 | Discharge: 2017-06-23 | Disposition: A | Discharge status: Home / Self Care | Payer: BLUE CROSS/BLUE SHIELD | Source: Ambulatory Visit | Attending: Gynecologic Oncology | Admitting: Gynecologic Oncology

## 2017-06-23 ENCOUNTER — Encounter (HOSPITAL_COMMUNITY): Payer: Self-pay

## 2017-06-23 DIAGNOSIS — N83291 Other ovarian cyst, right side: Secondary | ICD-10-CM | POA: Insufficient documentation

## 2017-06-23 DIAGNOSIS — Z01810 Encounter for preprocedural cardiovascular examination: Secondary | ICD-10-CM | POA: Diagnosis present

## 2017-06-23 DIAGNOSIS — I10 Essential (primary) hypertension: Secondary | ICD-10-CM | POA: Diagnosis not present

## 2017-06-23 DIAGNOSIS — Z01812 Encounter for preprocedural laboratory examination: Secondary | ICD-10-CM | POA: Diagnosis not present

## 2017-06-23 DIAGNOSIS — N83292 Other ovarian cyst, left side: Secondary | ICD-10-CM | POA: Diagnosis not present

## 2017-06-23 LAB — COMPREHENSIVE METABOLIC PANEL
ALK PHOS: 49 U/L (ref 38–126)
ALT: 16 U/L (ref 14–54)
ANION GAP: 8 (ref 5–15)
AST: 18 U/L (ref 15–41)
Albumin: 4 g/dL (ref 3.5–5.0)
BILIRUBIN TOTAL: 0.6 mg/dL (ref 0.3–1.2)
BUN: 16 mg/dL (ref 6–20)
CALCIUM: 8.9 mg/dL (ref 8.9–10.3)
CO2: 26 mmol/L (ref 22–32)
CREATININE: 0.76 mg/dL (ref 0.44–1.00)
Chloride: 107 mmol/L (ref 101–111)
GFR calc non Af Amer: >60 mL/min (ref >60)
Glucose, Bld: 94 mg/dL (ref 65–99)
Potassium: 3.8 mmol/L (ref 3.5–5.1)
Sodium: 141 mmol/L (ref 135–145)
TOTAL PROTEIN: 6.9 g/dL (ref 6.5–8.1)

## 2017-06-23 NOTE — Progress Notes (Signed)
CBCdiff 06-01-17 epic  CXR 06-01-17 epic

## 2017-06-23 NOTE — Patient Instructions (Signed)
TYNESHA FREE  06/23/2017   Your procedure is scheduled on: 06-29-17  Report to [facility] Main  Entrance Take [place]  elevators to 3rd floor to  [place] at 830AM.   Call this number if you have problems the morning of surgery [phone]   Remember: ONLY 1 PERSON MAY GO WITH YOU TO SHORT STAY TO GET  READY MORNING OF YOUR SURGERY.   Eat a light diet the day before surgery on Monday 06-28-17.  Examples including soups, broths, toast, yogurt, mashed potatoes.  Things to avoid include carbonated beverages (fizzy beverages), raw fruits and raw vegetables, or beans.  Do not eat food or drink liquids :After Midnight.     Take these medicines the morning of surgery with A SIP OF WATER: pantoprazole(protonix)                                 You may not have any metal on your body including hair pins and              piercings  Do not wear jewelry, make-up, lotions, powders or perfumes, deodorant             Do not wear nail polish.  Do not shave  48 hours prior to surgery.        Do not bring valuables to the hospital. [place].  Contacts, dentures or bridgework may not be worn into surgery.      Patients discharged the day of surgery will not be allowed to drive home.  Name and phone number of your driver:  Special Instructions: N/A              Please read over the following fact sheets you were given: _____________________________________________________________________          [facility] - Preparing for Surgery Before surgery, you can play an important role.  Because skin is not sterile, your skin needs to be as free of germs as possible.  You can reduce the number of germs on your skin by washing with CHG (chlorahexidine gluconate) soap before surgery.  CHG is an antiseptic cleaner which kills germs and bonds with the skin to continue killing germs even after washing. Please DO NOT use if you have  an allergy to CHG or antibacterial soaps.  If your skin becomes reddened/irritated stop using the CHG and inform your nurse when you arrive at Short Stay. Do not shave (including legs and underarms) for at least 48 hours prior to the first CHG shower.  You may shave your face/neck. Please follow these instructions carefully:  1.  Shower with CHG Soap the night before surgery and the  morning of Surgery.  2.  If you choose to wash your hair, wash your hair first as usual with your  normal  shampoo.  3.  After you shampoo, rinse your hair and body thoroughly to remove the  shampoo.                           4.  Use CHG as you would any other liquid soap.  You can apply chg directly  to the skin and wash  Gently with a scrungie or clean washcloth.  5.  Apply the CHG Soap to your body ONLY FROM THE NECK DOWN.   Do not use on face/ open                           Wound or open sores. Avoid contact with eyes, ears mouth and genitals (private parts).                       Wash face,  Genitals (private parts) with your normal soap.             6.  Wash thoroughly, paying special attention to the area where your surgery  will be performed.  7.  Thoroughly rinse your body with warm water from the neck down.  8.  DO NOT shower/wash with your normal soap after using and rinsing off  the CHG Soap.                9.  Pat yourself dry with a clean towel.            10.  Wear clean pajamas.            11.  Place clean sheets on your bed the night of your first shower and do not  sleep with pets. Day of Surgery : Do not apply any lotions/deodorants the morning of surgery.  Please wear clean clothes to the hospital/surgery center.  FAILURE TO FOLLOW THESE INSTRUCTIONS MAY RESULT IN THE CANCELLATION OF YOUR SURGERY PATIENT SIGNATURE_________________________________  NURSE  SIGNATURE__________________________________  ________________________________________________________________________   Adam Phenix  An incentive spirometer is a tool that can help keep your lungs clear and active. This tool measures how well you are filling your lungs with each breath. Taking long deep breaths may help reverse or decrease the chance of developing breathing (pulmonary) problems (especially infection) following:  A long period of time when you are unable to move or be active. BEFORE THE PROCEDURE   If the spirometer includes an indicator to show your best effort, your nurse or respiratory therapist will set it to a desired goal.  If possible, sit up straight or lean slightly forward. Try not to slouch.  Hold the incentive spirometer in an upright position. INSTRUCTIONS FOR USE  1. Sit on the edge of your bed if possible, or sit up as far as you can in bed or on a chair. 2. Hold the incentive spirometer in an upright position. 3. Breathe out normally. 4. Place the mouthpiece in your mouth and seal your lips tightly around it. 5. Breathe in slowly and as deeply as possible, raising the piston or the ball toward the top of the column. 6. Hold your breath for 3-5 seconds or for as long as possible. Allow the piston or ball to fall to the bottom of the column. 7. Remove the mouthpiece from your mouth and breathe out normally. 8. Rest for a few seconds and repeat Steps 1 through 7 at least 10 times every 1-2 hours when you are awake. Take your time and take a few normal breaths between deep breaths. 9. The spirometer may include an indicator to show your best effort. Use the indicator as a goal to work toward during each repetition. 10. After each set of 10 deep breaths, practice coughing to be sure your lungs are clear. If you have an incision (the cut made at the time of surgery),  support your incision when coughing by placing a pillow or rolled up towels firmly  against it. Once you are able to get out of bed, walk around indoors and cough well. You may stop using the incentive spirometer when instructed by your caregiver.  RISKS AND COMPLICATIONS  Take your time so you do not get dizzy or light-headed.  If you are in pain, you may need to take or ask for pain medication before doing incentive spirometry. It is harder to take a deep breath if you are having pain. AFTER USE  Rest and breathe slowly and easily.  It can be helpful to keep track of a log of your progress. Your caregiver can provide you with a simple table to help with this. If you are using the spirometer at home, follow these instructions: [place] IF:   You are having difficultly using the spirometer.  You have trouble using the spirometer as often as instructed.  Your pain medication is not giving enough relief while using the spirometer.  You develop fever of 100.5 F (38.1 C) or higher. SEEK IMMEDIATE MEDICAL CARE IF:   You cough up bloody sputum that had not been present before.  You develop fever of 102 F (38.9 C) or greater.  You develop worsening pain at or near the incision site. MAKE SURE YOU:   Understand these instructions.  Will watch your condition.  Will get help right away if you are not doing well or get worse. Document Released: 02/08/2007 Document Revised: 12/21/2011 Document Reviewed: 04/11/2007 ExitCare Patient Information 2014 ExitCare, [place].   ________________________________________________________________________  WHAT IS A BLOOD TRANSFUSION? Blood Transfusion Information  A transfusion is the replacement of blood or some of its parts. Blood is made up of multiple cells which provide different functions.  Red blood cells carry oxygen and are used for blood loss replacement.  White blood cells fight against infection.  Platelets control bleeding.  Plasma helps clot blood.  Other blood products are available for  specialized needs, such as hemophilia or other clotting disorders. BEFORE THE TRANSFUSION  Who gives blood for transfusions?   Healthy volunteers who are fully evaluated to make sure their blood is safe. This is blood bank blood. Transfusion therapy is the safest it has ever been in the practice of medicine. Before blood is taken from a donor, a complete history is taken to make sure that person has no history of diseases nor engages in risky social behavior (examples are intravenous drug use or sexual activity with multiple partners). The donor's travel history is screened to minimize risk of transmitting infections, such as malaria. The donated blood is tested for signs of infectious diseases, such as HIV and hepatitis. The blood is then tested to be sure it is compatible with you in order to minimize the chance of a transfusion reaction. If you or a relative donates blood, this is often done in anticipation of surgery and is not appropriate for emergency situations. It takes many days to process the donated blood. RISKS AND COMPLICATIONS Although transfusion therapy is very safe and saves many lives, the main dangers of transfusion include:   Getting an infectious disease.  Developing a transfusion reaction. This is an allergic reaction to something in the blood you were given. Every precaution is taken to prevent this. The decision to have a blood transfusion has been considered carefully by your caregiver before blood is given. Blood is not given unless the benefits outweigh the risks. AFTER THE TRANSFUSION  Right after receiving a blood transfusion, you will usually feel much better and more energetic. This is especially true if your red blood cells have gotten low (anemic). The transfusion raises the level of the red blood cells which carry oxygen, and this usually causes an energy increase.  The nurse administering the transfusion will monitor you carefully for complications. HOME CARE  INSTRUCTIONS  No special instructions are needed after a transfusion. You may find your energy is better. Speak with your caregiver about any limitations on activity for underlying diseases you may have. SEEK MEDICAL CARE IF:   Your condition is not improving after your transfusion.  You develop redness or irritation at the intravenous (IV) site. SEEK IMMEDIATE MEDICAL CARE IF:  Any of the following symptoms occur over the next 12 hours:  Shaking chills.  You have a temperature by mouth above 102 F (38.9 C), not controlled by medicine.  Chest, back, or muscle pain.  People around you feel you are not acting correctly or are confused.  Shortness of breath or difficulty breathing.  Dizziness and fainting.  You get a rash or develop hives.  You have a decrease in urine output.  Your urine turns a dark color or changes to pink, red, or brown. Any of the following symptoms occur over the next 10 days:  You have a temperature by mouth above 102 F (38.9 C), not controlled by medicine.  Shortness of breath.  Weakness after normal activity.  The white part of the eye turns yellow (jaundice).  You have a decrease in the amount of urine or are urinating less often.  Your urine turns a dark color or changes to pink, red, or brown. Document Released: 09/25/2000 Document Revised: 12/21/2011 Document Reviewed: 05/14/2008 [facility] Patient Information 2014 [place], [place].  _______________________________________________________________________

## 2017-06-24 LAB — ABO/RH: ABO/RH(D): A POS

## 2017-06-26 ENCOUNTER — Other Ambulatory Visit: Payer: Self-pay | Admitting: Family Medicine

## 2017-06-26 DIAGNOSIS — I10 Essential (primary) hypertension: Secondary | ICD-10-CM

## 2017-06-29 ENCOUNTER — Ambulatory Visit (HOSPITAL_COMMUNITY)
Admission: RE | Admit: 2017-06-29 | Discharge: 2017-06-29 | Disposition: A | Discharge status: Home / Self Care | Payer: BLUE CROSS/BLUE SHIELD | Source: Ambulatory Visit | Attending: Gynecologic Oncology | Admitting: Gynecologic Oncology

## 2017-06-29 ENCOUNTER — Encounter (HOSPITAL_COMMUNITY)
Admission: RE | Disposition: A | Discharge status: Home / Self Care | Payer: Self-pay | Source: Ambulatory Visit | Attending: Gynecologic Oncology

## 2017-06-29 ENCOUNTER — Ambulatory Visit (HOSPITAL_COMMUNITY): Payer: BLUE CROSS/BLUE SHIELD | Admitting: Certified Registered Nurse Anesthetist

## 2017-06-29 ENCOUNTER — Encounter (HOSPITAL_COMMUNITY): Payer: Self-pay | Admitting: *Deleted

## 2017-06-29 DIAGNOSIS — D573 Sickle-cell trait: Secondary | ICD-10-CM | POA: Insufficient documentation

## 2017-06-29 DIAGNOSIS — Z87442 Personal history of urinary calculi: Secondary | ICD-10-CM | POA: Insufficient documentation

## 2017-06-29 DIAGNOSIS — Z882 Allergy status to sulfonamides status: Secondary | ICD-10-CM | POA: Diagnosis not present

## 2017-06-29 DIAGNOSIS — D259 Leiomyoma of uterus, unspecified: Secondary | ICD-10-CM | POA: Insufficient documentation

## 2017-06-29 DIAGNOSIS — K589 Irritable bowel syndrome without diarrhea: Secondary | ICD-10-CM | POA: Diagnosis not present

## 2017-06-29 DIAGNOSIS — N83202 Unspecified ovarian cyst, left side: Secondary | ICD-10-CM | POA: Diagnosis not present

## 2017-06-29 DIAGNOSIS — Z79899 Other long term (current) drug therapy: Secondary | ICD-10-CM | POA: Insufficient documentation

## 2017-06-29 DIAGNOSIS — D649 Anemia, unspecified: Secondary | ICD-10-CM | POA: Diagnosis not present

## 2017-06-29 DIAGNOSIS — F419 Anxiety disorder, unspecified: Secondary | ICD-10-CM | POA: Insufficient documentation

## 2017-06-29 DIAGNOSIS — M1990 Unspecified osteoarthritis, unspecified site: Secondary | ICD-10-CM | POA: Diagnosis not present

## 2017-06-29 DIAGNOSIS — Z87891 Personal history of nicotine dependence: Secondary | ICD-10-CM | POA: Insufficient documentation

## 2017-06-29 DIAGNOSIS — N83299 Other ovarian cyst, unspecified side: Secondary | ICD-10-CM

## 2017-06-29 DIAGNOSIS — D219 Benign neoplasm of connective and other soft tissue, unspecified: Secondary | ICD-10-CM

## 2017-06-29 DIAGNOSIS — K219 Gastro-esophageal reflux disease without esophagitis: Secondary | ICD-10-CM | POA: Diagnosis not present

## 2017-06-29 DIAGNOSIS — D282 Benign neoplasm of uterine tubes and ligaments: Secondary | ICD-10-CM | POA: Diagnosis not present

## 2017-06-29 DIAGNOSIS — I10 Essential (primary) hypertension: Secondary | ICD-10-CM | POA: Diagnosis not present

## 2017-06-29 HISTORY — PX: ROBOTIC ASSISTED BILATERAL SALPINGO OOPHERECTOMY: SHX6078

## 2017-06-29 LAB — TYPE AND SCREEN
ABO/RH(D): A POS
ANTIBODY SCREEN: NEGATIVE

## 2017-06-29 LAB — CBC
HCT: 37.7 % (ref 36.0–46.0)
Hemoglobin: 12.9 g/dL (ref 12.0–15.0)
MCH: 28.2 pg (ref 26.0–34.0)
MCHC: 34.2 g/dL (ref 30.0–36.0)
MCV: 82.3 fL (ref 78.0–100.0)
PLATELETS: 222 10*3/uL (ref 150–400)
RBC: 4.58 MIL/uL (ref 3.87–5.11)
RDW: 13.7 % (ref 11.5–15.5)
WBC: 5.5 10*3/uL (ref 4.0–10.5)

## 2017-06-29 LAB — GLUCOSE, CAPILLARY: GLUCOSE-CAPILLARY: 164 mg/dL — AB (ref 65–99)

## 2017-06-29 SURGERY — SALPINGO-OOPHORECTOMY, BILATERAL, ROBOT-ASSISTED
Anesthesia: General | Site: Abdomen | Laterality: Bilateral

## 2017-06-29 MED ORDER — EPHEDRINE SULFATE 50 MG/ML IJ SOLN
INTRAMUSCULAR | Status: DC | PRN
  Administered 2017-06-29: 10 mg via INTRAVENOUS
  Administered 2017-06-29 (×2): 5 mg via INTRAVENOUS

## 2017-06-29 MED ORDER — FENTANYL CITRATE (PF) 100 MCG/2ML IJ SOLN: 25.0000 ug | INTRAMUSCULAR | Status: DC | PRN

## 2017-06-29 MED ORDER — LACTATED RINGERS IV SOLN: INTRAVENOUS | Status: DC

## 2017-06-29 MED ORDER — LACTATED RINGERS IR SOLN
Status: DC | PRN
  Administered 2017-06-29: 1000 mL

## 2017-06-29 MED ORDER — LIDOCAINE 2% (20 MG/ML) 5 ML SYRINGE
INTRAMUSCULAR | Status: DC | PRN
  Administered 2017-06-29: 80 mg via INTRAVENOUS

## 2017-06-29 MED ORDER — SUGAMMADEX SODIUM 200 MG/2ML IV SOLN
INTRAVENOUS | Status: DC | PRN
  Administered 2017-06-29: 178.4 mg via INTRAVENOUS

## 2017-06-29 MED ORDER — SODIUM CHLORIDE 0.9% FLUSH
3.0000 mL | INTRAVENOUS | Status: DC | PRN
Start: 2017-06-29 — End: 2017-06-29

## 2017-06-29 MED ORDER — TRAMADOL HCL 50 MG PO TABS: 50.0000 mg | ORAL_TABLET | Freq: Four times a day (QID) | ORAL | 0 refills | Status: DC | PRN

## 2017-06-29 MED ORDER — DEXAMETHASONE SODIUM PHOSPHATE 10 MG/ML IJ SOLN
INTRAMUSCULAR | Status: DC | PRN
  Administered 2017-06-29: 10 mg via INTRAVENOUS

## 2017-06-29 MED ORDER — ROCURONIUM BROMIDE 50 MG/5ML IV SOSY
PREFILLED_SYRINGE | INTRAVENOUS | Status: AC
  Filled 2017-06-29: qty 5

## 2017-06-29 MED ORDER — PHENYLEPHRINE HCL 10 MG/ML IJ SOLN
INTRAMUSCULAR | Status: DC | PRN
  Administered 2017-06-29: 40 ug via INTRAVENOUS
  Administered 2017-06-29 (×2): 80 ug via INTRAVENOUS
  Administered 2017-06-29: 40 ug via INTRAVENOUS

## 2017-06-29 MED ORDER — FENTANYL CITRATE (PF) 250 MCG/5ML IJ SOLN
INTRAMUSCULAR | Status: AC
  Filled 2017-06-29: qty 5

## 2017-06-29 MED ORDER — MIDAZOLAM HCL 2 MG/2ML IJ SOLN
INTRAMUSCULAR | Status: AC
  Filled 2017-06-29: qty 2

## 2017-06-29 MED ORDER — LACTATED RINGERS IV SOLN
INTRAVENOUS | Status: DC | PRN
  Administered 2017-06-29: 11:00:00 via INTRAVENOUS

## 2017-06-29 MED ORDER — MIDAZOLAM HCL 5 MG/5ML IJ SOLN
INTRAMUSCULAR | Status: DC | PRN
  Administered 2017-06-29: 2 mg via INTRAVENOUS

## 2017-06-29 MED ORDER — TRAMADOL HCL 50 MG PO TABS
50.0000 mg | ORAL_TABLET | Freq: Four times a day (QID) | ORAL | Status: DC | PRN
  Filled 2017-06-29: qty 1

## 2017-06-29 MED ORDER — PROPOFOL 10 MG/ML IV BOLUS
INTRAVENOUS | Status: DC | PRN
  Administered 2017-06-29: 160 mg via INTRAVENOUS

## 2017-06-29 MED ORDER — SODIUM CHLORIDE 0.9% FLUSH: 3.0000 mL | Freq: Two times a day (BID) | INTRAVENOUS | Status: DC

## 2017-06-29 MED ORDER — ACETAMINOPHEN 500 MG PO TABS: 1000.0000 mg | ORAL_TABLET | Freq: Four times a day (QID) | ORAL | Status: DC

## 2017-06-29 MED ORDER — METOCLOPRAMIDE HCL 5 MG/ML IJ SOLN: 10.0000 mg | Freq: Once | INTRAMUSCULAR | Status: DC | PRN

## 2017-06-29 MED ORDER — KETOROLAC TROMETHAMINE 30 MG/ML IJ SOLN
30.0000 mg | Freq: Four times a day (QID) | INTRAMUSCULAR | Status: DC
  Administered 2017-06-29: 30 mg via INTRAVENOUS
  Filled 2017-06-29: qty 1

## 2017-06-29 MED ORDER — CEFAZOLIN SODIUM-DEXTROSE 2-4 GM/100ML-% IV SOLN
2.0000 g | INTRAVENOUS | Status: AC
  Administered 2017-06-29: 2 g via INTRAVENOUS
  Filled 2017-06-29: qty 100

## 2017-06-29 MED ORDER — PROPOFOL 10 MG/ML IV BOLUS
INTRAVENOUS | Status: AC
  Filled 2017-06-29: qty 20

## 2017-06-29 MED ORDER — FENTANYL CITRATE (PF) 100 MCG/2ML IJ SOLN
INTRAMUSCULAR | Status: AC
  Filled 2017-06-29: qty 2

## 2017-06-29 MED ORDER — FENTANYL CITRATE (PF) 100 MCG/2ML IJ SOLN
25.0000 ug | INTRAMUSCULAR | Status: DC | PRN
  Administered 2017-06-29 (×2): 50 ug via INTRAVENOUS

## 2017-06-29 MED ORDER — MEPERIDINE HCL 50 MG/ML IJ SOLN: 6.2500 mg | INTRAMUSCULAR | Status: DC | PRN

## 2017-06-29 MED ORDER — STERILE WATER FOR IRRIGATION IR SOLN
Status: DC | PRN
  Administered 2017-06-29: 1000 mL

## 2017-06-29 MED ORDER — DEXAMETHASONE SODIUM PHOSPHATE 10 MG/ML IJ SOLN
INTRAMUSCULAR | Status: AC
  Filled 2017-06-29: qty 1

## 2017-06-29 MED ORDER — FENTANYL CITRATE (PF) 100 MCG/2ML IJ SOLN
INTRAMUSCULAR | Status: DC | PRN
  Administered 2017-06-29 (×4): 50 ug via INTRAVENOUS

## 2017-06-29 MED ORDER — SODIUM CHLORIDE 0.9 % IV SOLN: 250.0000 mL | INTRAVENOUS | Status: DC | PRN

## 2017-06-29 MED ORDER — ONDANSETRON HCL 4 MG/2ML IJ SOLN
INTRAMUSCULAR | Status: DC | PRN
  Administered 2017-06-29: 4 mg via INTRAVENOUS

## 2017-06-29 MED ORDER — ACETAMINOPHEN 650 MG RE SUPP: 650.0000 mg | RECTAL | Status: DC | PRN

## 2017-06-29 MED ORDER — ACETAMINOPHEN 325 MG PO TABS: 650.0000 mg | ORAL_TABLET | ORAL | Status: DC | PRN

## 2017-06-29 MED ORDER — ROCURONIUM BROMIDE 50 MG/5ML IV SOSY
PREFILLED_SYRINGE | INTRAVENOUS | Status: DC | PRN
  Administered 2017-06-29: 10 mg via INTRAVENOUS
  Administered 2017-06-29: 40 mg via INTRAVENOUS
  Administered 2017-06-29: 10 mg via INTRAVENOUS

## 2017-06-29 MED ORDER — LIDOCAINE 2% (20 MG/ML) 5 ML SYRINGE
INTRAMUSCULAR | Status: AC
  Filled 2017-06-29: qty 5

## 2017-06-29 MED ORDER — LACTATED RINGERS IV SOLN
INTRAVENOUS | Status: DC
  Administered 2017-06-29 (×2): via INTRAVENOUS

## 2017-06-29 MED ORDER — ONDANSETRON HCL 4 MG/2ML IJ SOLN
INTRAMUSCULAR | Status: AC
  Filled 2017-06-29: qty 2

## 2017-06-29 SURGICAL SUPPLY — 43 items
ADH SKN CLS APL DERMABOND .7 (GAUZE/BANDAGES/DRESSINGS) ×1
BAG LAPAROSCOPIC 12 15 PORT 16 (BASKET) IMPLANT
BAG RETRIEVAL 12/15 (BASKET)
BAG SPEC RTRVL LRG 6X4 10 (ENDOMECHANICALS) ×2
CHLORAPREP W/TINT 26ML (MISCELLANEOUS) ×2 IMPLANT
COVER BACK TABLE 60X90IN (DRAPES) ×2 IMPLANT
COVER TIP SHEARS 8 DVNC (MISCELLANEOUS) ×1 IMPLANT
COVER TIP SHEARS 8MM DA VINCI (MISCELLANEOUS) ×1
DERMABOND ADVANCED (GAUZE/BANDAGES/DRESSINGS) ×1
DERMABOND ADVANCED .7 DNX12 (GAUZE/BANDAGES/DRESSINGS) IMPLANT
DRAPE ARM DVNC X/XI (DISPOSABLE) ×4 IMPLANT
DRAPE COLUMN DVNC XI (DISPOSABLE) ×1 IMPLANT
DRAPE DA VINCI XI ARM (DISPOSABLE) ×4
DRAPE DA VINCI XI COLUMN (DISPOSABLE) ×1
DRAPE SHEET LG 3/4 BI-LAMINATE (DRAPES) ×4 IMPLANT
DRAPE SURG IRRIG POUCH 19X23 (DRAPES) ×2 IMPLANT
ELECT REM PT RETURN 15FT ADLT (MISCELLANEOUS) ×2 IMPLANT
GLOVE BIO SURGEON STRL SZ 6 (GLOVE) ×8 IMPLANT
GLOVE BIO SURGEON STRL SZ 6.5 (GLOVE) ×4 IMPLANT
GOWN STRL REUS W/ TWL LRG LVL3 (GOWN DISPOSABLE) ×3 IMPLANT
GOWN STRL REUS W/TWL LRG LVL3 (GOWN DISPOSABLE) ×6
HOLDER FOLEY CATH W/STRAP (MISCELLANEOUS) ×2 IMPLANT
IRRIG SUCT STRYKERFLOW 2 WTIP (MISCELLANEOUS) ×2
IRRIGATION SUCT STRKRFLW 2 WTP (MISCELLANEOUS) ×1 IMPLANT
MANIPULATOR UTERINE 4.5 ZUMI (MISCELLANEOUS) ×1 IMPLANT
OBTURATOR OPTICAL STANDARD 8MM (TROCAR) ×1
OBTURATOR OPTICAL STND 8 DVNC (TROCAR) ×1
OBTURATOR OPTICALSTD 8 DVNC (TROCAR) ×1 IMPLANT
PACK ROBOT GYN CUSTOM WL (TRAY / TRAY PROCEDURE) ×2 IMPLANT
PAD POSITIONING PINK XL (MISCELLANEOUS) ×2 IMPLANT
POUCH SPECIMEN RETRIEVAL 10MM (ENDOMECHANICALS) ×2 IMPLANT
SEAL CANN UNIV 5-8 DVNC XI (MISCELLANEOUS) ×4 IMPLANT
SEAL XI 5MM-8MM UNIVERSAL (MISCELLANEOUS) ×4
SET TRI-LUMEN FLTR TB AIRSEAL (TUBING) ×1 IMPLANT
SLEEVE SURGEON STRL (DRAPES) ×1 IMPLANT
SOLUTION ELECTROLUBE (MISCELLANEOUS) ×2 IMPLANT
SUT VIC AB 0 CT1 27 (SUTURE) ×2
SUT VIC AB 0 CT1 27XBRD ANTBC (SUTURE) IMPLANT
TOWEL OR NON WOVEN STRL DISP B (DISPOSABLE) ×2 IMPLANT
TRAP SPECIMEN MUCOUS 40CC (MISCELLANEOUS) ×1 IMPLANT
TRAY FOLEY W/METER SILVER 16FR (SET/KITS/TRAYS/PACK) ×2 IMPLANT
UNDERPAD 30X30 (UNDERPADS AND DIAPERS) ×2 IMPLANT
WATER STERILE IRR 1000ML POUR (IV SOLUTION) ×2 IMPLANT

## 2017-06-29 NOTE — Anesthesia Procedure Notes (Signed)
Procedure Name: Intubation Date/Time: 06/29/2017 10:54 AM Performed by: West Pugh Pre-anesthesia Checklist: Patient identified, Emergency Drugs available, Suction available, Patient being monitored and Timeout performed Patient Re-evaluated:Patient Re-evaluated prior to induction Oxygen Delivery Method: Circle system utilized Preoxygenation: Pre-oxygenation with 100% oxygen Induction Type: IV induction Ventilation: Mask ventilation without difficulty Laryngoscope Size: Mac and 3 Grade View: Grade I Tube type: Oral Tube size: 7.5 mm Number of attempts: 1 Airway Equipment and Method: Stylet Placement Confirmation: ETT inserted through vocal cords under direct vision,  positive ETCO2,  CO2 detector and breath sounds checked- equal and bilateral Secured at: 20 cm Tube secured with: Tape Dental Injury: Teeth and Oropharynx as per pre-operative assessment

## 2017-06-29 NOTE — Discharge Instructions (Addendum)
Bilateral Salpingo-Oophorectomy, Care After This sheet gives you information about how to care for yourself after your procedure. Your health care provider may also give you more specific instructions. If you have problems or questions, contact your health care provider. What can I expect after the procedure? After the procedure, it is common to have:  Abdominal pain.  Some occasional vaginal bleeding (spotting).  Tiredness.  Symptoms of menopause, such as hot flashes, night sweats, or mood swings.  Follow these instructions at home: Incision care  Keep your incision area and your bandage (dressing) clean and dry.  Follow instructions from your health care provider about how to take care of your incision. Make sure you: ? Wash your hands with soap and water before you change your dressing. If soap and water are not available, use hand sanitizer. ? Change your dressing as told by your health care provider.  Leave skin glue in place. These skin closures may need to stay in place for 2 weeks or longer. Check your incision area every day for signs of infection. Check for: Redness, swelling, or pain. ? Fluid or blood. ? Warmth. ? Pus or a bad smell. Activity  Do not drive or use heavy machinery while taking prescription pain medicine.  Do not drive for 24 hours if you received a medicine to help you relax (sedative) during your procedure.  Take frequent, short walks throughout the day. Rest when you get tired. Ask your health care provider what activities are safe for you.  Avoid activity that requires great effort. Also, avoid heavy lifting. Do not lift anything that is heavier than 10 lbs. (4.5 kg), or the limit that your health care provider tells you, until he or she says that it is safe to do so.  Do not douche, use tampons, or have sex until your health care provider approves. General instructions  To prevent or treat constipation while you are taking prescription pain  medicine, your health care provider may recommend that you: ? Drink enough fluid to keep your urine clear or pale yellow. ? Take over-the-counter or prescription medicines. ? Eat foods that are high in fiber, such as fresh fruits and vegetables, whole grains, and beans. ? Limit foods that are high in fat and processed sugars, such as fried and sweet foods.  Take over-the-counter and prescription medicines only as told by your health care provider.  Do not take baths, swim, or use a hot tub until your health care provider approves. Ask your health care provider if you can take showers. You may only be allowed to take sponge baths for bathing.  Wear compression stockings as told by your health care provider. These stockings help to prevent blood clots and reduce swelling in your legs.  Keep all follow-up visits as told by your health care provider. This is important. Contact a health care provider if:  You have pain when you urinate.  You have pus or a bad smelling discharge coming from your vagina.  You have redness, swelling, or pain around your incision.  You have fluid or blood coming from your incision.  Your incision feels warm to the touch.  You have pus or a bad smell coming from your incision.  You have a fever.  Your incision starts to break open.  You have pain in the abdomen, and it gets worse or does not get better when you take medicine.  You develop a rash.  You develop nausea and vomiting.  You feel lightheaded. Get  help right away if:  You develop pain in your chest or leg.  You become short of breath.  You faint.  You have increased bleeding from your vagina. Summary  After the procedure, it is common to have pain, bleeding in the vagina, and symptoms of menopause.  Follow instructions from your health care provider about how to take care of your incision.  Follow instructions from your health care provider about activities and  restrictions.  Check your incision every day for signs of infection and report any symptoms to your health care provider. This information is not intended to replace advice given to you by your health care provider. Make sure you discuss any questions you have with your health care provider. Document Released: 09/28/2005 Document Revised: 11/02/2016 Document Reviewed: 11/02/2016 Elsevier Interactive Patient Education  2018 [place] Anesthesia, Adult, Care After These instructions provide you with information about caring for yourself after your procedure. Your health care provider may also give you more specific instructions. Your treatment has been planned according to current medical practices, but problems sometimes occur. Call your health care provider if you have any problems or questions after your procedure. What can I expect after the procedure? After the procedure, it is common to have:  Vomiting.  A sore throat.  Mental slowness.  It is common to feel:  Nauseous.  Cold or shivery.  Sleepy.  Tired.  Sore or achy, even in parts of your body where you did not have surgery.  Follow these instructions at home: For at least 24 hours after the procedure:  Do not: ? Participate in activities where you could fall or become injured. ? Drive. ? Use heavy machinery. ? Drink alcohol. ? Take sleeping pills or medicines that cause drowsiness. ? Make important decisions or sign legal documents. ? Take care of children on your own.  Rest. Eating and drinking  If you vomit, drink water, juice, or soup when you can drink without vomiting.  Drink enough fluid to keep your urine clear or pale yellow.  Make sure you have little or no nausea before eating solid foods.  Follow the diet recommended by your health care provider. General instructions  Have a responsible adult stay with you until you are awake and alert.  Return to your normal activities as told  by your health care provider. Ask your health care provider what activities are safe for you.  Take over-the-counter and prescription medicines only as told by your health care provider.  If you smoke, do not smoke without supervision.  Keep all follow-up visits as told by your health care provider. This is important. Contact a health care provider if:  You continue to have nausea or vomiting at home, and medicines are not helpful.  You cannot drink fluids or start eating again.  You cannot urinate after 8-12 hours.  You develop a skin rash.  You have fever.  You have increasing redness at the site of your procedure. Get help right away if:  You have difficulty breathing.  You have chest pain.  You have unexpected bleeding.  You feel that you are having a life-threatening or urgent problem. This information is not intended to replace advice given to you by your health care provider. Make sure you discuss any questions you have with your health care provider. Document Released: 01/04/2001 Document Revised: 03/02/2016 Document Reviewed: 09/12/2015 Elsevier Interactive Patient Education  [employer].

## 2017-06-29 NOTE — Anesthesia Preprocedure Evaluation (Signed)
Anesthesia Evaluation  Patient identified by MRN, date of birth, ID band Patient awake    Reviewed: Allergy & Precautions, NPO status , Patient's Chart, lab work & pertinent test results  Airway Mallampati: II  TM Distance: >3 FB Neck ROM: Full    Dental no notable dental hx.    Pulmonary former smoker,    Pulmonary exam normal breath sounds clear to auscultation       Cardiovascular hypertension, Pt. on medications Normal cardiovascular exam Rhythm:Regular Rate:Normal     Neuro/Psych negative neurological ROS  negative psych ROS   GI/Hepatic negative GI ROS, Neg liver ROS,   Endo/Other  negative endocrine ROSdiabetes  Renal/GU negative Renal ROS  negative genitourinary   Musculoskeletal negative musculoskeletal ROS (+)   Abdominal   Peds negative pediatric ROS (+)  Hematology negative hematology ROS (+)   Anesthesia Other Findings   Reproductive/Obstetrics negative OB ROS                             Anesthesia Physical Anesthesia Plan  ASA: II  Anesthesia Plan: General   Post-op Pain Management:    Induction: Intravenous  PONV Risk Score and Plan: 3 and Ondansetron, Dexamethasone and Midazolam  Airway Management Planned: Oral ETT  Additional Equipment:   Intra-op Plan:   Post-operative Plan: Extubation in OR  Informed Consent: I have reviewed the patients History and Physical, chart, labs and discussed the procedure including the risks, benefits and alternatives for the proposed anesthesia with the patient or authorized representative who has indicated his/her understanding and acceptance.   Dental advisory given  Plan Discussed with: CRNA  Anesthesia Plan Comments:         Anesthesia Quick Evaluation

## 2017-06-29 NOTE — Anesthesia Postprocedure Evaluation (Signed)
Anesthesia Post Note  Patient: ZRIYAH KOPPLIN  Procedure(s) Performed: Procedure(s) (LRB): XI ROBOTIC ASSISTED BILATERAL SALPINGO OOPHORECTOMY WITH PERITONEAL WASHINGS (Bilateral)     Patient location during evaluation: PACU Anesthesia Type: General Level of consciousness: awake and alert Pain management: pain level controlled Vital Signs Assessment: post-procedure vital signs reviewed and stable Respiratory status: spontaneous breathing, nonlabored ventilation, respiratory function stable and patient connected to nasal cannula oxygen Cardiovascular status: blood pressure returned to baseline and stable Postop Assessment: no apparent nausea or vomiting Anesthetic complications: no    Last Vitals:  Vitals:   06/29/17 1400 06/29/17 1424  BP: 116/81 128/67  Pulse: 74 68  Resp: (!) 21 14  Temp: (!) 36.3 C 36.5 C  SpO2: 100% 94%    Last Pain:  Vitals:   06/29/17 1432  TempSrc:   PainSc: 2                  Montez Hageman

## 2017-06-29 NOTE — Interval H&P Note (Signed)
History and Physical Interval Note:  06/29/2017 10:01 AM  Rachael Garcia  has presented today for surgery, with the diagnosis of complex ovarian cyst  The various methods of treatment have been discussed with the patient and family. After consideration of risks, benefits and other options for treatment, the patient has consented to  Procedure(s): XI ROBOTIC [place] (Bilateral) as a surgical intervention .  The patient's history has been reviewed, patient examined, no change in status, stable for surgery.  I have reviewed the patient's chart and labs.  Questions were answered to the patient's satisfaction.     Donaciano Eva

## 2017-06-29 NOTE — H&P (View-Only) (Signed)
Consult Note: Gyn-Onc  Consult was requested by Dr. Phineas Real for the evaluation of Rachael Garcia 61 y.o. female  CC:  Chief Complaint  Patient presents with  . Cyst of ovary, unspecified laterality    Assessment/Plan:  Rachael Garcia  is a 61 y.o.  year old with a left ovarian cyst (5cm). CA 125 is pending.  The cyst is most likely benign based on its appearance. I offered Rachael Garcia the option of serial imaging with CA 125 assessments vs surgical exploration with BSO, possible hysterectomy and staging (if malignancy is identified). The patient is very anxious about the possibility of cancer and is electing for definitive management with BSO. I think that this is reasonable given that she has an acceptable surgical risk profile. I explained thought that there remain risks with surgery including  bleeding, infection, damage to internal organs (such as bladder,ureters, bowels), blood clot, reoperation and rehospitalization.  She would like to proceed with a surgical dates as soon as possible and is scheduled for next week.   HPI: Rachael Garcia is a 61 yr old P2 who is seen in consultation at the request of Dr Phineas Real for a left ovarian cyst.  The patient was seen in the ED on 05/28/17 for flank pain from a kidney stone. A renal stone study CT was ordered and an incidental finding of a left ovarian cyst was identified in addition to fibroids in the uterus.  On 06/10/17 she had a follow-up US which confirmed a uterus measuring 8.1x4.3x7.1cm with a 3cm calcified fundal fibroid, the right ovary was not visualized. The left ovary was replaced by a complex cystic mass measuring 5x2.4x2.1cm with multiple septations and mild internal blood flow. There was an additional cystic area 2cm in size adjacent which appeared to be a dilated fallopian tube.  The patient had a CA 125 drawn on 06/18/17 which is not yet resulted at the time of this visit.  She is otherwise fairly healthy with  hypertension. She had menopause at 64 with no subsequent bleeding. Her only prior abdominal surgeries were 2 c/s's and a laparoscopy for an ovarian cyst. She has no significant family history (for breast/ovarian cancer).  Current Meds:  Outpatient Encounter Prescriptions as of 06/21/2017  Medication Sig  . amLODipine (NORVASC) 10 MG tablet take 1 tablet by mouth once daily  . famotidine (PEPCID) 20 MG tablet One at bedtime  . fluticasone (FLONASE) 50 MCG/ACT nasal spray SPRAY 2 SPRAYS INTO EACH NOSTRIL EVERY DAY  . pantoprazole (PROTONIX) 40 MG tablet Take 1 tablet (40 mg total) by mouth daily. Take 30-60 min before first meal of the day   No facility-administered encounter medications on file as of 06/21/2017.     Allergy:  Allergies  Allergen Reactions  . Codeine     Hives   . Erythromycin     hives  . Sulfonamide Derivatives     swelling    Social Hx:   Social History   Social History  . Marital status: Widowed    Spouse name: N/A  . Number of children: 2  . Years of education: N/A   Occupational History  . insurance agent/broker [place] History Main Topics  . Smoking status: Former Smoker    Years: 8.00    Types: Cigarettes    Quit date: 07/13/2011  . Smokeless tobacco: Never Used  . Alcohol use 0.0 oz/week     Comment: OCCASIONALLY WINE  . Drug use: No  .  Sexual activity: Not Currently    Birth control/ protection: Post-menopausal     Comment: 1st intercourse 61 yo-More than 5 partners   Other Topics Concern  . Not on file   Social History Narrative  . No narrative on file    Past Surgical Hx:  Past Surgical History:  Procedure Laterality Date  . CARPAL TUNNEL RELEASE Right 03  . CARPAL TUNNEL RELEASE  08/20/2011   Procedure: CARPAL TUNNEL RELEASE;  Surgeon: Cammie Sickle., MD;  Location: [place];  Service: Orthopedics;  Laterality: Left;  . CESAREAN SECTION  83,92   x 2  .  COLONOSCOPY    . DIAGNOSTIC LAPAROSCOPY     ovarian cyst removal  . DILATION AND CURETTAGE OF UTERUS    . MASS EXCISION Right 07/03/2015   Procedure: EXCISION RIGHT LONG FINGER MASS;  Surgeon: Charlotte Crumb, MD;  Location: [place];  Service: Orthopedics;  Laterality: Right;  . TUBAL LIGATION      Past Medical Hx:  Past Medical History:  Diagnosis Date  . Allergy   . Anemia   . Anxiety   . Arthritis   . Blood dyscrasia    sickle cell trait  . Complication of anesthesia not sure   oxygen dropped post ? hyst  . GERD (gastroesophageal reflux disease)   . Heart murmur   . Hypertension   . IBS (irritable bowel syndrome)   . Kidney stone     Past Gynecological History:  C/s x 2. Ovarian cyst surgery No LMP recorded. Patient is postmenopausal.  Family Hx:  Family History  Problem Relation Age of Onset  . Arthritis Mother   . Anemia Mother   . Hypertension Mother   . Diabetes Sister   . Hypertension Father   . Alcohol abuse Father   . Heart disease Father 80       defibrillator and pacemaker  . Hyperlipidemia Father   . Stomach cancer Paternal Grandmother   . Multiple myeloma Maternal Grandmother   . Stroke Sister   . Diabetes Brother   . Cancer Brother        Throat    Review of Systems:  Constitutional  Feels well,    ENT Normal appearing ears and nares bilaterally Skin/Breast  No rash, sores, jaundice, itching, dryness Cardiovascular  No chest pain, shortness of breath, or edema  Pulmonary  No cough or wheeze.  Gastro Intestinal  No nausea, vomitting, or diarrhoea. No bright red blood per rectum, no abdominal pain, change in bowel movement, or constipation.  Genito Urinary  No frequency, urgency, dysuria, no pain or bleeding Musculo Skeletal  No myalgia, arthralgia, joint swelling or pain  Neurologic  No weakness, numbness, change in gait,  Psychology  No depression, anxiety, insomnia.   Vitals:  Blood pressure (!) 154/97, pulse  85, temperature (!) 97.5 F (36.4 C), temperature source Oral, resp. rate 20, height _0  (1.676 m), weight 193 lb (87.5 kg), SpO2 98 %.  Physical Exam: WD in NAD Neck  Supple NROM, without any enlargements.  Lymph Node Survey No cervical supraclavicular or inguinal adenopathy Cardiovascular  Pulse normal rate, regularity and rhythm. S1 and S2 normal.  Lungs  Clear to auscultation bilateraly, without wheezes/crackles/rhonchi. Good air movement.  Skin  No rash/lesions/breakdown  Psychiatry  Alert and oriented to person, place, and time  Abdomen  Normoactive bowel sounds, abdomen soft, non-tender and nonobese without evidence of hernia.  Back No CVA tenderness Genito Urinary  Vulva/vagina:  Normal external female genitalia.   No lesions. No discharge or bleeding.  Bladder/urethra:  No lesions or masses, well supported bladder  Vagina: normal  Cervix: Normal appearing, no lesions.  Uterus: bulky, 10cm, mobile, no parametrial involvement or nodularity.  Adnexa: no discretely palpable masses. Rectal  Good tone, no masses no cul de sac nodularity.  Extremities  No bilateral cyanosis, clubbing or edema.   Donaciano Eva, MD  06/21/2017, 11:09 AM

## 2017-06-29 NOTE — Transfer of Care (Signed)
Immediate Anesthesia Transfer of Care Note  Patient: Rachael Garcia  Procedure(s) Performed: Procedure(s): XI ROBOTIC ASSISTED BILATERAL SALPINGO OOPHORECTOMY WITH PERITONEAL WASHINGS (Bilateral)  Patient Location: PACU  Anesthesia Type:General  Level of Consciousness:  sedated, patient cooperative and responds to stimulation  Airway & Oxygen Therapy:Patient Spontanous Breathing and Patient connected to face mask oxgen  Post-op Assessment:  Report given to PACU RN and Post -op Vital signs reviewed and stable  Post vital signs:  Reviewed and stable  Last Vitals:  Vitals:   06/29/17 0848  BP: (!) 143/82  Pulse: 83  Resp: 18  Temp: 36.6 C  SpO2: 75%    Complications: No apparent anesthesia complications

## 2017-06-29 NOTE — Op Note (Signed)
OPERATIVE NOTE  Date: 06/29/17  Preoperative Diagnosis: left ovarian cyst   Postoperative Diagnosis:  Same and pedunculated fibroids  Procedure(s) Performed: Robotic-assisted laparoscopic bilateral salpingo-oophorectomy. myomectomy  Surgeon: Everitt Amber, M.D.  Assistant Surgeon: Lahoma Crocker M.D. (an MD assistant was necessary for tissue manipulation, management of robotic instrumentation, retraction and positioning due to the complexity of the case and hospital policies).   Anesthesia: Gen. endotracheal.  Specimens: Bilateral ovaries, fallopian tubes, pelvic washings  Estimated Blood Loss: <20 mL. Blood Replacement: None  Complications: none  Indication for Procedure:  Left ovarian complex cyst  Operative Findings: 6cm complex left ovarian cyst (benign), left broad ligament fibroid. Normal right tube and ovary. Surgically interrupted tubes.  Frozen pathology was consistent with serous cyst.  Procedure: The patient's taken to the operating room and placed under general endotracheal anesthesia testing difficulty. She is placed in a dorsolithotomy position and cervical acromial pad was placed. The arms were tucked with care taken to pad the olecranon process. And prepped and draped in usual sterile fashion. A uterine manipulator (zumi) was placed vaginally. A 51mm incision was made in the left upper quadrant palmer's point and a 5 mm Optiview trocar used to enter the abdomen under direct visualization. With entry into the abdomen and then maintenance of 15 mm of mercury the patient was placed in Trendelenburg position. An incision was made in the umbilicus and a 78GN trochar was placed through this site. Two incisions were made lateral to the umbilical incision in the left and right abdomen measuring 58mm. These incisions were made approximately 10 cm lateral to the umbilical incision. 8 mm robotic trochars were inserted. The robot was docked.  The abdomen was inspected as was the  pelvis.  Pelvic washings were obtained. An incision was made on the right pelvic side wall peritoneum parallel to the IP ligament and the retroperitoneal space entered. The right ureter was identified and the para-rectal space was developed. A window was created in the right broad ligament above the ureter. The right infundibulopelvic vessels were skeletonized cauterized and transected. The utero-ovarian ligaments similarly were cauterized and transected. Specimen was placed in an Endo Catch bag.  In a similar manner the left peritoneum and the side wall was incised, and the retroperitoneal space entered. The left ureter was identified and the left pararectal space was developed. The utero-ovarian ligament was skeletonized cauterized and transected. The pedunculated broad ligament fibroid was skeletonized at its pedicle and included with the ovary. The left utero-ovarian ligaments were cauterized and transected in the left adnexa was placed in an Endo Catch bag.  The abdomen was copiously irrigated and drained and all operative sites inspected and hemostasis was assured  The robot was undocked. The contents of the left Endo Catch bag were first aspirated and then morcellated to facilitate removal from the abdominal cavity through the LUQ incision. In a similar fashion the contents of the right Endo Catch bag or morcellated to facilitate removal from the abdominal cavity.  The ports were all remove. The fascial closure at the left upper quadrant port was made with 0 Vicryl.  All incisions were closed with a running subcuticular Monocryl suture. Dermabond was applied. Sponge, lap and needle counts were correct x 3.    The patient had sequential compression devices for VTE prophylaxis.         Disposition: PACU -stable         Condition: stable  Donaciano Eva, MD

## 2017-07-01 ENCOUNTER — Ambulatory Visit: Payer: BLUE CROSS/BLUE SHIELD | Attending: Gynecologic Oncology | Admitting: Gynecologic Oncology

## 2017-07-01 ENCOUNTER — Encounter: Payer: Self-pay | Admitting: Gynecologic Oncology

## 2017-07-01 VITALS — BP 154/85 | HR 54 | Temp 97.8°F | Resp 18 | Wt 194.2 lb

## 2017-07-01 DIAGNOSIS — N83209 Unspecified ovarian cyst, unspecified side: Secondary | ICD-10-CM

## 2017-07-01 DIAGNOSIS — I10 Essential (primary) hypertension: Secondary | ICD-10-CM | POA: Insufficient documentation

## 2017-07-01 DIAGNOSIS — Z882 Allergy status to sulfonamides status: Secondary | ICD-10-CM | POA: Diagnosis not present

## 2017-07-01 DIAGNOSIS — Z87891 Personal history of nicotine dependence: Secondary | ICD-10-CM | POA: Diagnosis not present

## 2017-07-01 DIAGNOSIS — K589 Irritable bowel syndrome without diarrhea: Secondary | ICD-10-CM | POA: Insufficient documentation

## 2017-07-01 DIAGNOSIS — K219 Gastro-esophageal reflux disease without esophagitis: Secondary | ICD-10-CM | POA: Insufficient documentation

## 2017-07-01 DIAGNOSIS — N83202 Unspecified ovarian cyst, left side: Secondary | ICD-10-CM | POA: Insufficient documentation

## 2017-07-01 DIAGNOSIS — D573 Sickle-cell trait: Secondary | ICD-10-CM | POA: Diagnosis not present

## 2017-07-01 DIAGNOSIS — F419 Anxiety disorder, unspecified: Secondary | ICD-10-CM | POA: Insufficient documentation

## 2017-07-01 NOTE — Progress Notes (Signed)
Office Visit Note: Gyn-Onc  Referring physician Dr. Burnard Garcia 61 y.o. female  CC:  Chief Complaint  Patient presents with  . Cyst of ovary, unspecified laterality    Assessment/Plan:  [name]  is a 62 y.o.  year old with a left ovarian cyst (5cm) Who is status post Robotic-assisted laparoscopic bilateral salpingo-oophorectomy. Myomectomy 06/29/2017. She presents today with reports of separation of several port site incisions no discharge or bleeding. Dermabond was placed. Final path is not yet available.  Follow-up with Dr. Denman Garcia as previously scheduled   HPI: Rachael Garcia is a 61 yr old P2 who was seen in  the ED on 05/28/17 for flank pain from a kidney stone. A renal stone study CT was ordered and an incidental finding of a left ovarian cyst was identified in addition to fibroids in the uterus.  On 06/10/17 she had a follow-up US which confirmed a uterus measuring 8.1x4.3x7.1cm with a 3cm calcified fundal fibroid, the right ovary was not visualized. The left ovary was replaced by a complex cystic mass measuring 5x2.4x2.1cm with multiple septations and mild internal blood flow. There was an additional cystic area 2cm in size adjacent which appeared to be a dilated fallopian tube.  The patient had a CA 125 drawn on 06/18/17 which is not yet resulted at the time of this visit.  She is otherwise fairly healthy with hypertension. She had menopause at 76 with no subsequent bleeding. Her only prior abdominal surgeries were 2 c/s's and a laparoscopy for an ovarian cyst. She has no significant family history (for breast/ovarian cancer).  On 06/29/2017 underwent Robotic-assisted laparoscopic bilateral salpingo-oophorectomy. Myomectomy.  Operative Findings: 6cm complex left ovarian cyst (benign), left broad ligament fibroid. Normal right tube and ovary. Surgically interrupted tubes.  Frozen pathology was consistent with serous cyst.    Reports separation of the  incision sites yesterday denies discharge or bleeding from the port sites  Current Meds:  Outpatient Encounter Prescriptions as of 07/01/2017  Medication Sig  . amLODipine (NORVASC) 10 MG tablet take 1 tablet by mouth once daily (Patient taking differently: take 1 tablet by mouth once daily at bedtime)  . betamethasone dipropionate (DIPROLENE) 0.05 % ointment Apply 1 application topically at bedtime.  . famotidine (PEPCID) 20 MG tablet One at bedtime (Patient taking differently: Take 20 mg by mouth at bedtime. )  . fluticasone (FLONASE) 50 MCG/ACT nasal spray SPRAY 2 SPRAYS INTO EACH NOSTRIL EVERY DAY (Patient taking differently: SPRAY 2 SPRAYS INTO EACH NOSTRIL ONCE A DAY AS NEEDED FOR ALLERGIES)  . traMADol (ULTRAM) 50 MG tablet Take 1 tablet (50 mg total) by mouth every 6 (six) hours as needed.  . pantoprazole (PROTONIX) 40 MG tablet Take 1 tablet (40 mg total) by mouth daily. Take 30-60 min before first meal of the day (Patient taking differently: Take 40 mg by mouth daily before breakfast. Take 30-60 min before first meal of the day)   No facility-administered encounter medications on file as of 07/01/2017.     Allergy:  Allergies  Allergen Reactions  . Codeine     Hives   . Erythromycin     hives  . Sulfonamide Derivatives     Eyedrops cause swelling of the eyes    Social Hx:   Social History   Social History  . Marital status: Widowed    Spouse name: N/A  . Number of children: 2  . Years of education: N/A   Occupational History  .  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 Smoking status: Former Smoker    Years: 8.00    Types: Cigarettes    Quit date: 07/13/2011  . Smokeless tobacco: Never Used  . Alcohol use 1.8 oz/week    3 Glasses of wine per week     Comment: OCCASIONALLY WINE  . Drug use: No  . Sexual activity: Not Currently    Birth control/ protection: Post-menopausal     Comment: 1st intercourse 61  yo-More than 5 partners   Other Topics Concern  . Not on file   Social History Narrative  . No narrative on file    Past Surgical Hx:  Past Surgical History:  Procedure Laterality Date  . CARPAL TUNNEL RELEASE Right 03  . CARPAL TUNNEL RELEASE  08/20/2011   Procedure: CARPAL TUNNEL RELEASE;  Surgeon: Cammie Sickle., MD;  Location: [place] [facility];  Service: Orthopedics;  Laterality: Left;  . CESAREAN SECTION  83,92   x 2  . COLONOSCOPY    . DIAGNOSTIC LAPAROSCOPY     ovarian cyst removal  . DILATION AND CURETTAGE OF UTERUS    . MASS EXCISION Right 07/03/2015   Procedure: EXCISION RIGHT LONG FINGER MASS;  Surgeon: Charlotte Crumb, MD;  Location: [place];  Service: Orthopedics;  Laterality: Right;  . ROBOTIC ASSISTED BILATERAL SALPINGO OOPHERECTOMY Bilateral 06/29/2017   Procedure: XI ROBOTIC ASSISTED BILATERAL SALPINGO OOPHORECTOMY WITH PERITONEAL WASHINGS;  Surgeon: Everitt Amber, MD;  Location: [facility];  Service: Gynecology;  Laterality: Bilateral;  . TUBAL LIGATION      Past Medical Hx:  Past Medical History:  Diagnosis Date  . Allergy   . Anemia   . Anxiety   . Arthritis   . Blood dyscrasia    sickle cell trait  . Complication of anesthesia    o2 sat drop with epidural for  c-section surgery ;  subsequent surgeries no problems  . Diabetes mellitus without complication ([place])    [id number] had hga1c of 7 prescribed metformin; returned for repeat a1c that was a 5.? ; does not take any diabetes medication currently   . GERD (gastroesophageal reflux disease)   . Heart murmur    "i've always had this"   . Hypertension   . IBS (irritable bowel syndrome)   . Kidney stone     Past Gynecological History:  C/s x 2. Ovarian cyst surgery No LMP recorded. Patient is postmenopausal.  Family Hx:  Family History  Problem Relation Age of Onset  . Arthritis Mother   . Anemia Mother   . Hypertension Mother   . Diabetes Sister   . Hypertension Father    . Alcohol abuse Father   . Heart disease Father 30       defibrillator and pacemaker  . Hyperlipidemia Father   . Stomach cancer Paternal Grandmother   . Multiple myeloma Maternal Grandmother   . Stroke Sister   . Diabetes Brother   . Cancer Brother        Throat    Review of Systems:  Constitutional  Feels well,      Vitals:  Blood pressure (!) 154/85, pulse (!) 54, temperature 97.8 F (36.6 C), temperature source Oral, resp. rate 18, weight 194 lb 3.2 oz (88.1 kg), SpO2 100 %.  Physical Exam: WD in NAD Abdomen  Normoactive bowel sounds, abdomen soft, non-tender and nonobese without evidence of hernia. Ecchymosis of the left upper quadrant and left  abdominal trocar sites. Separation of the epidermis of all laparoscopic sites except the umbilical port. No evidence of cellulitis or drainage Dermabond was applied and the edges re approximated. Back No CVA tenderness Extremities  No bilateral cyanosis, clubbing or edema.   Janie Morning, MD  07/01/2017, 3:03 PM

## 2017-07-01 NOTE — Patient Instructions (Signed)
Plan to follow up as scheduled.  Please call for any needs.

## 2017-07-02 ENCOUNTER — Telehealth: Payer: Self-pay

## 2017-07-02 NOTE — Telephone Encounter (Signed)
Told Rachael Garcia the the pathology was good. No cancer. Keep follow up visit as scheduled with Dr. Skeet Latch.

## 2017-07-05 ENCOUNTER — Telehealth: Payer: Self-pay | Admitting: Family Medicine

## 2017-07-05 NOTE — Telephone Encounter (Signed)
Patient is very upset tried to explain she should have enough refills until she can get in for an appointment .  She started yelling saying our policy is ridiculous she doesn't understand why she has to be seen again since she was just here.  I tried to explain because she wasn't seen for her blood pressure.   She was seen for sinus issues.  She didn't want to hear what I had to say wanted to speak to stallings and proceed to yell saying I was talking over her.     I explained I was trying to explain our policy to her as she was talking over me.

## 2017-07-05 NOTE — Telephone Encounter (Signed)
Pt is needing to know what her medication was denied   Best number [phone]

## 2017-07-05 NOTE — Telephone Encounter (Signed)
Noted  

## 2017-07-06 ENCOUNTER — Telehealth: Payer: Self-pay | Admitting: *Deleted

## 2017-07-06 ENCOUNTER — Telehealth: Payer: Self-pay

## 2017-07-06 NOTE — Telephone Encounter (Signed)
Received angry phone call from pt wanting a refill for blood pressure medication. Advised pt that she will need an office visit in order to have medication refilled. Pt threatening staff with lawsuits, constant yelling, and insults. Advised pt that this is not productive and that I was attempting to help her. After patient continued to yell advised pt that she will need to call back when she is ready to have a conversation if she is not willing to listen or cooperate. Pt still continued to yell, harass, and threaten.  Pt did not want to listen to any advise. I hung up.

## 2017-07-06 NOTE — Telephone Encounter (Signed)
Attempted to contact the patient to move the October 25th appt from the morning to afternoon. No answer, phone rang the disconnected. Unable to leave message.

## 2017-07-08 ENCOUNTER — Ambulatory Visit (INDEPENDENT_AMBULATORY_CARE_PROVIDER_SITE_OTHER): Payer: BLUE CROSS/BLUE SHIELD | Admitting: Physician Assistant

## 2017-07-08 ENCOUNTER — Encounter: Payer: Self-pay | Admitting: Physician Assistant

## 2017-07-08 VITALS — BP 173/73 | HR 90 | Temp 98.3°F | Resp 16 | Ht 66.0 in | Wt 196.6 lb

## 2017-07-08 DIAGNOSIS — R300 Dysuria: Secondary | ICD-10-CM

## 2017-07-08 DIAGNOSIS — R8299 Other abnormal findings in urine: Secondary | ICD-10-CM

## 2017-07-08 DIAGNOSIS — I10 Essential (primary) hypertension: Secondary | ICD-10-CM | POA: Diagnosis not present

## 2017-07-08 DIAGNOSIS — R82998 Other abnormal findings in urine: Secondary | ICD-10-CM

## 2017-07-08 LAB — POC MICROSCOPIC URINALYSIS (UMFC): Mucus: ABSENT

## 2017-07-08 LAB — POCT URINALYSIS DIP (MANUAL ENTRY)
BILIRUBIN UA: NEGATIVE
GLUCOSE UA: NEGATIVE mg/dL
Ketones, POC UA: NEGATIVE mg/dL
Nitrite, UA: NEGATIVE
Protein Ur, POC: NEGATIVE mg/dL
RBC UA: NEGATIVE
SPEC GRAV UA: 1.02 (ref 1.010–1.025)
Urobilinogen, UA: 0.2 E.U./dL
pH, UA: 6.5 (ref 5.0–8.0)

## 2017-07-08 MED ORDER — NITROFURANTOIN MONOHYD MACRO 100 MG PO CAPS: 100.0000 mg | ORAL_CAPSULE | Freq: Two times a day (BID) | ORAL | 0 refills | Status: AC

## 2017-07-08 MED ORDER — AMLODIPINE BESYLATE 10 MG PO TABS: 10.0000 mg | ORAL_TABLET | Freq: Every day | ORAL | 3 refills | Status: DC

## 2017-07-08 NOTE — Progress Notes (Signed)
  07/08/2017 5:31 PM   DOB: 07/22/1956 / MRN: [id number]  SUBJECTIVE:  Rachael Garcia is a 61 y.o. female presenting for blood pressure medication refills.  She has been out of her blood pressure medication for about three days now. Denies HA, SOB, vision changes. She is very upset with the practice for making her come in for an evaluation of her blood pressure problem.    Has follow up with GYN regarding oophorectomy middle of next month. Does tell me that she has been having dysuria the past few days.   She is allergic to codeine; erythromycin; and sulfonamide derivatives.   She  has a past medical history of Allergy; Anemia; Anxiety; Arthritis; Blood dyscrasia; Complication of anesthesia; Diabetes mellitus without complication (HCC); GERD (gastroesophageal reflux disease); Heart murmur; Hypertension; IBS (irritable bowel syndrome); and Kidney stone.    She  reports that she quit smoking about 5 years ago. Her smoking use included Cigarettes. She quit after 8.00 years of use. She has never used smokeless tobacco. She reports that she drinks about 1.8 oz of alcohol per week . She reports that she does not use drugs. She  reports that she does not currently engage in sexual activity. She reports using the following method of birth control/protection: Post-menopausal. The patient  has a past surgical history that includes Tubal ligation; Dilation and curettage of uterus; Colonoscopy; Diagnostic laparoscopy; Cesarean section (83,92); Carpal tunnel release (Right, 03); Carpal tunnel release (08/20/2011); Mass excision (Right, 07/03/2015); and Robotic assisted bilateral salpingo oophorectomy (Bilateral, 06/29/2017).  Her family history includes Alcohol abuse in her father; Anemia in her mother; Arthritis in her mother; Cancer in her brother; Diabetes in her brother and sister; Heart disease (age of onset: 62) in her father; Hyperlipidemia in her father; Hypertension in her father and mother; Multiple myeloma  in her maternal grandmother; Stomach cancer in her paternal grandmother; Stroke in her sister.  Review of Systems  Constitutional: Negative for chills, diaphoresis and fever.  Eyes: Negative.   Respiratory: Negative for shortness of breath.   Cardiovascular: Negative for chest pain, orthopnea and leg swelling.  Gastrointestinal: Positive for abdominal pain (lower). Negative for nausea.  Genitourinary: Positive for dysuria. Negative for frequency, hematuria and urgency.  Skin: Negative for rash.  Neurological: Negative for dizziness, sensory change, speech change, focal weakness and headaches.    The problem list and medications were reviewed and updated by myself where necessary and exist elsewhere in the encounter.   OBJECTIVE:  BP (!) 173/73 (BP Location: Right Arm, Patient Position: Sitting, Cuff Size: Large)   Pulse 90   Temp 98.3 F (36.8 C) (Oral)   Resp 16   Ht 5' 6" (1.676 m)   Wt 196 lb 9.6 oz (89.2 kg)   SpO2 97%   BMI 31.73 kg/m   Physical Exam  Constitutional: She is active.  Non-toxic appearance.  Cardiovascular: Normal rate, regular rhythm, S1 normal, S2 normal, normal heart sounds and intact distal pulses.  Exam reveals no gallop, no friction rub and no decreased pulses.   No murmur heard. Pulmonary/Chest: Effort normal. No stridor. No tachypnea. No respiratory distress. She has no wheezes. She has no rales.  Abdominal: Soft. Normal appearance and bowel sounds are normal. She exhibits no distension and no mass. There is no tenderness. There is no rigidity, no rebound, no guarding and no CVA tenderness.  Three trochar incisions, all healing well.  Musculoskeletal: She exhibits no edema.  Neurological: She is alert.  Skin: Skin is   warm and dry. She is not diaphoretic. No pallor.    BP Readings from Last 3 Encounters:  07/08/17 (!) 173/73  07/01/17 (!) 154/85  06/29/17 (!) 129/91     Lab Results  Component Value Date   WBC 5.5 06/29/2017   HGB 12.9  06/29/2017   HCT 37.7 06/29/2017   MCV 82.3 06/29/2017   PLT 222 06/29/2017    Lab Results  Component Value Date   CREATININE 0.76 06/23/2017   BUN 16 06/23/2017   NA 141 06/23/2017   K 3.8 06/23/2017   CL 107 06/23/2017   CO2 26 06/23/2017    Lab Results  Component Value Date   ALT 16 06/23/2017   AST 18 06/23/2017   ALKPHOS 49 06/23/2017   BILITOT 0.6 06/23/2017    Lab Results  Component Value Date   TSH 2.060 07/09/2016    Lab Results  Component Value Date   HGBA1C 6.5 (H) 07/09/2016    Lab Results  Component Value Date   CHOL 254 (H) 07/09/2016   HDL 66 07/09/2016   LDLCALC 155 (H) 07/09/2016   TRIG 165 (H) 07/09/2016   CHOLHDL 3.8 07/09/2016      Results for orders placed or performed in visit on 07/08/17 (from the past 72 hour(s))  POCT urinalysis dipstick     Status: Abnormal   Collection Time: 07/08/17  3:19 PM  Result Value Ref Range   Color, UA yellow yellow   Clarity, UA clear clear   Glucose, UA negative negative mg/dL   Bilirubin, UA negative negative   Ketones, POC UA negative negative mg/dL   Spec Grav, UA 1.020 1.010 - 1.025   Blood, UA negative negative   pH, UA 6.5 5.0 - 8.0   Protein Ur, POC negative negative mg/dL   Urobilinogen, UA 0.2 0.2 or 1.0 E.U./dL   Nitrite, UA Negative Negative   Leukocytes, UA Small (1+) (A) Negative  POCT Microscopic Urinalysis ([facility])     Status: Abnormal   Collection Time: 07/08/17  4:07 PM  Result Value Ref Range   WBC,UR,HPF,POC Few (A) None WBC/hpf   RBC,UR,HPF,POC None None RBC/hpf   Bacteria Few (A) None, Too numerous to count   Mucus Absent Absent   Epithelial Cells, UR Per Microscopy None None, Too numerous to count cells/hpf    No results found.  ASSESSMENT AND PLAN:  Rachael Garcia was seen today for hypertension.  Diagnoses and all orders for this visit:  Essential hypertension: Previously somewhat controlled. Refilled norvasc today and advised that she be seen in about 3 months for a  recheck to ensure that she does not need any additional therapy. I doubt she will be coming back here.    Dysuria: Exam is normal.  Did advise we treat per urine results today. If positive will plan to culture and treat empirically  Will call her with a plan today.   The patient is advised to call or return to clinic if she does not see an improvement in symptoms, or to seek the care of the closest emergency department if she worsens with the above plan.   Philis Fendt, MHS, PA-C [facility] 07/08/2017 5:31 PM

## 2017-07-08 NOTE — Patient Instructions (Signed)
     IF you received an x-ray today, you will receive an invoice from [facility]. Please contact [facility] at [phone] with questions or concerns regarding your invoice.   IF you received labwork today, you will receive an invoice from [facility]. Please contact [facility] at [phone] with questions or concerns regarding your invoice.   Our billing staff will not be able to assist you with questions regarding bills from these companies.  You will be contacted with the lab results as soon as they are available. The fastest way to get your results is to activate your My Chart account. Instructions are located on the last page of this paperwork. If you have not heard from us regarding the results in 2 weeks, please contact this office.     

## 2017-07-10 LAB — URINE CULTURE

## 2017-07-13 ENCOUNTER — Telehealth: Payer: Self-pay | Admitting: *Deleted

## 2017-07-13 NOTE — Telephone Encounter (Signed)
Patient called back and we moved her appt on October 25th from the morning to the afternoon.

## 2017-07-13 NOTE — Telephone Encounter (Signed)
Attempted to contact the patient to reschedule her appt on October 25th from the morning to the afternoon. LMOM for the patient to call the office back.

## 2017-07-14 ENCOUNTER — Ambulatory Visit: Payer: BLUE CROSS/BLUE SHIELD | Admitting: Internal Medicine

## 2017-07-21 ENCOUNTER — Telehealth: Payer: Self-pay | Admitting: *Deleted

## 2017-07-21 NOTE — Telephone Encounter (Signed)
Attempted to return the patient's cll regarding her October 25th appt. The patient needs to move the appt to the morning, no available in the morning. LMOM for the patient that we have no opens and to call the office back.

## 2017-08-05 ENCOUNTER — Telehealth: Payer: Self-pay

## 2017-08-05 ENCOUNTER — Other Ambulatory Visit: Payer: Self-pay

## 2017-08-05 ENCOUNTER — Ambulatory Visit: Payer: BLUE CROSS/BLUE SHIELD | Attending: Gynecologic Oncology | Admitting: Gynecologic Oncology

## 2017-08-05 ENCOUNTER — Other Ambulatory Visit (HOSPITAL_BASED_OUTPATIENT_CLINIC_OR_DEPARTMENT_OTHER): Payer: BLUE CROSS/BLUE SHIELD

## 2017-08-05 VITALS — BP 141/81 | HR 78 | Resp 20 | Ht 66.0 in | Wt 199.5 lb

## 2017-08-05 DIAGNOSIS — Z87442 Personal history of urinary calculi: Secondary | ICD-10-CM | POA: Insufficient documentation

## 2017-08-05 DIAGNOSIS — R300 Dysuria: Secondary | ICD-10-CM

## 2017-08-05 DIAGNOSIS — N83202 Unspecified ovarian cyst, left side: Secondary | ICD-10-CM

## 2017-08-05 DIAGNOSIS — I10 Essential (primary) hypertension: Secondary | ICD-10-CM | POA: Diagnosis not present

## 2017-08-05 DIAGNOSIS — K219 Gastro-esophageal reflux disease without esophagitis: Secondary | ICD-10-CM | POA: Diagnosis not present

## 2017-08-05 DIAGNOSIS — Z09 Encounter for follow-up examination after completed treatment for conditions other than malignant neoplasm: Secondary | ICD-10-CM | POA: Diagnosis present

## 2017-08-05 DIAGNOSIS — Z79899 Other long term (current) drug therapy: Secondary | ICD-10-CM | POA: Insufficient documentation

## 2017-08-05 DIAGNOSIS — Z87891 Personal history of nicotine dependence: Secondary | ICD-10-CM | POA: Insufficient documentation

## 2017-08-05 LAB — URINALYSIS, MICROSCOPIC - CHCC
Bilirubin (Urine): NEGATIVE
GLUCOSE UR CHCC: NEGATIVE mg/dL
Ketones: NEGATIVE mg/dL
NITRITE: NEGATIVE
PROTEIN: NEGATIVE mg/dL
SPECIFIC GRAVITY, URINE: 1.01 (ref 1.003–1.035)
UROBILINOGEN UR: 0.2 mg/dL (ref 0.2–1)
pH: 6 (ref 4.6–8.0)

## 2017-08-05 MED ORDER — CEPHALEXIN 500 MG PO CAPS: 500.0000 mg | ORAL_CAPSULE | Freq: Two times a day (BID) | ORAL | 0 refills | Status: DC

## 2017-08-05 NOTE — Telephone Encounter (Signed)
LM for Rachael Garcia that her urine looks suspious for a UTI. Sent in prescription for Keflex 500 mg bid x 5 days.  Will let her know results of the urine culture when available.

## 2017-08-05 NOTE — Addendum Note (Signed)
Addended by: Baruch Merl on: 08/05/2017 05:14 PM   Modules accepted: Orders

## 2017-08-05 NOTE — Progress Notes (Signed)
Office Visit Note: Gyn-Onc  Referring physician Dr. Burnard Leigh 61 y.o. female  CC:  Post op check  Assessment/Plan:  [name]  is a 61 y.o.  year old with a left ovarian cyst (5cm) Who is status post Robotic-assisted laparoscopic bilateral salpingo-oophorectomy. Myomectomy 06/29/2017.  Final path is benign  Follow-up with Dr. Phineas Real  Dysuria Treated for a postop UTI.  Now reports mild dysuria.  Will obtain a UA to assess for infection versus passage of kidney stones   HPI: Rachael Garcia is a 61 yr old P2 who was seen in  the ED on 05/28/17 for flank pain from a kidney stone. A renal stone study CT was ordered and an incidental finding of a left ovarian cyst was identified in addition to fibroids in the uterus.  On 06/10/17 she had a follow-up US which confirmed a uterus measuring 8.1x4.3x7.1cm with a 3cm calcified fundal fibroid, the right ovary was not visualized. The left ovary was replaced by a complex cystic mass measuring 5x2.4x2.1cm with multiple septations and mild internal blood flow. There was an additional cystic area 2cm in size adjacent which appeared to be a dilated fallopian tube.  The patient had a CA 125 drawn on 06/18/17 which is not yet resulted at the time of this visit.  She is otherwise fairly healthy with hypertension. She had menopause at 61 with no subsequent bleeding. Her only prior abdominal surgeries were 2 c/s's and a laparoscopy for an ovarian cyst. She has no significant family history (for breast/ovarian cancer).  On 06/29/2017 underwent Robotic-assisted laparoscopic bilateral salpingo-oophorectomy. Myomectomy.  Operative Findings: 6cm complex left ovarian cyst (benign), left broad ligament fibroid. Normal right tube and ovary. Surgically interrupted tubes.  Frozen pathology was consistent with serous cyst.   1. Ovary and fallopian tube, left - BENIGN OVARIAN SEROUS CYSTADENOMA. - BENIGN PARTIALLY CALCIFIED LEIOMYOMA. -  BENIGN FALLOPIAN TUBE. - NO ENDOMETRIOSIS, BORDERLINE CHANGE OR MALIGNANCY. 2. Ovary and fallopian tube, right - UNREMARKABLE OVARY AND FALLOPIAN TUBE. - NO ENDOMETRIOSIS OR MALIGNANCY \ Current Meds:  Outpatient Encounter Prescriptions as of 08/05/2017  Medication Sig  . amLODipine (NORVASC) 10 MG tablet Take 1 tablet (10 mg total) by mouth daily.  . fluticasone (FLONASE) 50 MCG/ACT nasal spray SPRAY 2 SPRAYS INTO EACH NOSTRIL EVERY DAY (Patient taking differently: SPRAY 2 SPRAYS INTO EACH NOSTRIL ONCE A DAY AS NEEDED FOR ALLERGIES)  . loratadine (CLARITIN) 10 MG tablet Take 10 mg by mouth daily as needed for allergies.  . [DISCONTINUED] betamethasone dipropionate (DIPROLENE) 0.05 % ointment Apply 1 application topically at bedtime.  . [DISCONTINUED] famotidine (PEPCID) 20 MG tablet One at bedtime (Patient taking differently: Take 20 mg by mouth at bedtime. )  . [DISCONTINUED] pantoprazole (PROTONIX) 40 MG tablet Take 1 tablet (40 mg total) by mouth daily. Take 30-60 min before first meal of the day (Patient taking differently: Take 40 mg by mouth daily before breakfast. Take 30-60 min before first meal of the day)  . [DISCONTINUED] traMADol (ULTRAM) 50 MG tablet Take 1 tablet (50 mg total) by mouth every 6 (six) hours as needed. (Patient not taking: Reported on 07/08/2017)   No facility-administered encounter medications on file as of 08/05/2017.     Allergy:  Allergies  Allergen Reactions  . Codeine     Hives   . Erythromycin     hives  . Sulfonamide Derivatives     Eyedrops cause swelling of the eyes    Social Hx:  Social History   Social History  . Marital status: Widowed    Spouse name: N/A  . Number of children: 2  . Years of education: N/A   Occupational History  . insurance agent/broker [employer] History Main Topics  . Smoking status: Former Smoker    Years: 8.00    Types: Cigarettes    Quit date: 07/13/2011  .  Smokeless tobacco: Never Used  . Alcohol use 1.8 oz/week    3 Glasses of wine per week     Comment: OCCASIONALLY WINE  . Drug use: No  . Sexual activity: Not Currently    Birth control/ protection: Post-menopausal     Comment: 1st intercourse 61 yo-More than 5 partners   Other Topics Concern  . Not on file   Social History Narrative  . No narrative on file    Past Surgical Hx:  Past Surgical History:  Procedure Laterality Date  . CARPAL TUNNEL RELEASE Right 03  . CARPAL TUNNEL RELEASE  08/20/2011   Procedure: CARPAL TUNNEL RELEASE;  Surgeon: Cammie Sickle., MD;  Location: [facility];  Service: Orthopedics;  Laterality: Left;  . CESAREAN SECTION  83,92   x 2  . COLONOSCOPY    . DIAGNOSTIC LAPAROSCOPY     ovarian cyst removal  . DILATION AND CURETTAGE OF UTERUS    . MASS EXCISION Right 07/03/2015   Procedure: EXCISION RIGHT LONG FINGER MASS;  Surgeon: Charlotte Crumb, MD;  Location: [facility];  Service: Orthopedics;  Laterality: Right;  . ROBOTIC ASSISTED BILATERAL SALPINGO OOPHERECTOMY Bilateral 06/29/2017   Procedure: XI ROBOTIC ASSISTED BILATERAL SALPINGO OOPHORECTOMY WITH PERITONEAL WASHINGS;  Surgeon: Everitt Amber, MD;  Location: [facility];  Service: Gynecology;  Laterality: Bilateral;  . TUBAL LIGATION      Past Medical Hx:  Past Medical History:  Diagnosis Date  . Allergy   . Anemia   . Anxiety   . Arthritis   . Blood dyscrasia    sickle cell trait  . Complication of anesthesia    o2 sat drop with epidural for  c-section surgery ;  subsequent surgeries no problems  . Diabetes mellitus without complication ([place])    [id number] had hga1c of 7 prescribed metformin; returned for repeat a1c that was a 5.? ; does not take any diabetes medication currently   . GERD (gastroesophageal reflux disease)   . Heart murmur    "i've always had this"   . Hypertension   . IBS (irritable bowel syndrome)   . Kidney stone     Past Gynecological  History:  C/s x 2. Ovarian cyst surgery No LMP recorded. Patient is postmenopausal.  Family Hx:  Family History  Problem Relation Age of Onset  . Arthritis Mother   . Anemia Mother   . Hypertension Mother   . Diabetes Sister   . Hypertension Father   . Alcohol abuse Father   . Heart disease Father 62       defibrillator and pacemaker  . Hyperlipidemia Father   . Stomach cancer Paternal Grandmother   . Multiple myeloma Maternal Grandmother   . Stroke Sister   . Diabetes Brother   . Cancer Brother        Throat    Review of Systems:  Constitutional  Feels well,      Vitals:  Blood pressure (!) 141/81, pulse 78, resp. rate 20, height '5\' 6"'$  (1.676 m), weight 199 lb 8 oz (90.5 kg),  SpO2 99 %.  Physical Exam: WD in NAD Abdomen  Normoactive bowel sounds, abdomen soft, non-tender and nonobese without evidence of hernia.  Incision sites intact without evidence of hernia or cellulitis . Back No CVA tenderness Extremities  No bilateral cyanosis, clubbing or edema.   Janie Morning, MD  08/05/2017, 2:56 PM

## 2017-08-05 NOTE — Patient Instructions (Signed)
We will call you with the results of your urine.  Plan to follow up with Dr. Phineas Real.

## 2017-08-09 LAB — URINE CULTURE

## 2017-08-11 NOTE — Telephone Encounter (Signed)
LM for Ms Payano that the Urine culture did show she had a UTI.  She is to call back if she has any symptoms of a UTI after completion of ATB per Joylene John, NP.

## 2017-10-11 ENCOUNTER — Other Ambulatory Visit: Payer: Self-pay | Admitting: Gynecology

## 2017-10-11 DIAGNOSIS — Z1231 Encounter for screening mammogram for malignant neoplasm of breast: Secondary | ICD-10-CM

## 2017-10-14 ENCOUNTER — Other Ambulatory Visit: Payer: Self-pay | Admitting: Family Medicine

## 2017-10-15 NOTE — Telephone Encounter (Signed)
Flonase refill.  Would he like this approved?

## 2017-10-18 NOTE — Telephone Encounter (Signed)
Refill request for flonase 72mcg approved with 0 refills approved.  Advised pharmacy no more refills without office visit. Dgaddy, CMA

## 2017-11-02 ENCOUNTER — Ambulatory Visit
Admission: RE | Admit: 2017-11-02 | Discharge: 2017-11-02 | Disposition: A | Discharge status: Home / Self Care | Payer: BLUE CROSS/BLUE SHIELD | Source: Ambulatory Visit | Attending: Gynecology | Admitting: Gynecology

## 2017-11-02 DIAGNOSIS — Z1231 Encounter for screening mammogram for malignant neoplasm of breast: Secondary | ICD-10-CM

## 2017-11-18 ENCOUNTER — Telehealth: Payer: Self-pay | Admitting: Family Medicine

## 2017-11-18 NOTE — Telephone Encounter (Signed)
Called pt to let her know that her medical records are ready to be pickled up at the [facility] [address].   There are 2 different envelopes and I informed pt of this.  Thanks!

## 2017-11-23 ENCOUNTER — Ambulatory Visit (INDEPENDENT_AMBULATORY_CARE_PROVIDER_SITE_OTHER): Payer: BLUE CROSS/BLUE SHIELD | Admitting: Gynecology

## 2017-11-23 ENCOUNTER — Encounter: Payer: Self-pay | Admitting: Gynecology

## 2017-11-23 VITALS — BP 124/78 | Ht 65.0 in | Wt 198.0 lb

## 2017-11-23 DIAGNOSIS — N951 Menopausal and female climacteric states: Secondary | ICD-10-CM

## 2017-11-23 DIAGNOSIS — Z01411 Encounter for gynecological examination (general) (routine) with abnormal findings: Secondary | ICD-10-CM

## 2017-11-23 DIAGNOSIS — N952 Postmenopausal atrophic vaginitis: Secondary | ICD-10-CM | POA: Diagnosis not present

## 2017-11-23 NOTE — Patient Instructions (Signed)
Follow-up in 1 year, sooner as needed. 

## 2017-11-23 NOTE — Progress Notes (Signed)
    Rachael Garcia Jul 09, 1956 [id number]        61 y.o.  [id number] for annual gynecologic exam.  Doing well without gynecologic complaints.  Recently underwent robotic laparoscopic bilateral salpingo-oophorectomy and myomectomy by Dr. 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  Has done well since then.  Past medical history,surgical history, problem list, medications, allergies, family history and social history were all reviewed and documented as reviewed in the EPIC chart.  ROS:  Performed with pertinent positives and negatives included in the history, assessment and plan.   Additional significant findings : None   Exam: [name] assistant Vitals:   11/23/17 1431  BP: 124/78  Weight: 198 lb (89.8 kg)  Height: 5\' 5"  (1.651 m)   Body mass index is 32.95 kg/m.  General appearance:  Normal affect, orientation and appearance. Skin: Grossly normal HEENT: Without gross lesions.  No cervical or supraclavicular adenopathy. Thyroid normal.  Lungs:  Clear without wheezing, rales or rhonchi Cardiac: RR, without RMG Abdominal:  Soft, nontender, without masses, guarding, rebound, organomegaly or hernia Breasts:  Examined lying and sitting without masses, retractions, discharge or axillary adenopathy. Pelvic:  Ext, BUS, Vagina: With atrophic changes  Cervix: With atrophic changes.  Pap smear done  Uterus: Anteverted, normal size, shape and contour, midline and mobile nontender   Adnexa: Without masses or tenderness    Anus and perineum: Normal   Rectovaginal: Normal sphincter tone without palpated masses or tenderness.    Assessment/Plan:  62 y.o. [id number] female for annual gynecologic exam.   1. Postmenopausal/atrophic genital changes.  Notes since her robotic salpingo-oophorectomies she has had some hot flushes and sweats.  Nothing overly bothersome but just something new that she is noticed.  We discussed not unusual with removal of her  ovaries any residual hormone production removal may account for her symptoms.  She is not overly bothered by this and will continue to monitor for now and hopefully that will resolve over time.  No vaginal dryness or any vaginal bleeding.  Continue to monitor and report any issues or bleeding. 2. Pap smear 2016.  Pap smear done today.  No history of significant abnormal Pap smears. 3. Mammography 10/2017.  Continue with annual mammography next year.  Breast exam normal today. 4. Colonoscopy 2016.  Repeat at their recommended interval. 5. DEXA 2010 normal.  Recommend repeat DEXA next year at 10-year interval. 6. Health maintenance.  No routine lab work done as patient does this elsewhere.  Follow-up 1 year, sooner as needed.   Anastasio Auerbach MD, 3:20 PM 11/23/2017

## 2017-11-23 NOTE — Addendum Note (Signed)
Addended by: Nelva Nay on: 11/23/2017 04:07 PM   Modules accepted: Orders

## 2017-11-26 LAB — PAP IG W/ RFLX HPV ASCU

## 2018-04-01 ENCOUNTER — Ambulatory Visit
Admission: RE | Admit: 2018-04-01 | Discharge: 2018-04-01 | Disposition: A | Discharge status: Home / Self Care | Payer: BLUE CROSS/BLUE SHIELD | Source: Ambulatory Visit | Attending: Family Medicine | Admitting: Family Medicine

## 2018-04-01 ENCOUNTER — Other Ambulatory Visit: Payer: Self-pay | Admitting: Family Medicine

## 2018-04-01 DIAGNOSIS — M25551 Pain in right hip: Secondary | ICD-10-CM

## 2018-06-23 IMAGING — US US TRANSVAGINAL NON-OB
1 series · 13 of 25 positions shown · non-contrast
Comparison: CT scan May 28, 2017

CLINICAL DATA: Possible cystic lesions in the left adnexum based on
recent CT imaging.

EXAM:
TRANSABDOMINAL AND TRANSVAGINAL ULTRASOUND OF PELVIS
TECHNIQUE: Both transabdominal and transvaginal ultrasound examinations of the
pelvis were performed. Transabdominal technique was performed for
global imaging of the pelvis including uterus, ovaries, adnexal
regions, and pelvic cul-de-sac. It was necessary to proceed with
endovaginal exam following the transabdominal exam to visualize the
endometrium and ovaries.

[Series 1: us transvaginal non-ob · 0.28mm/px · 13 of 44 slices shown]
[im 1/44]
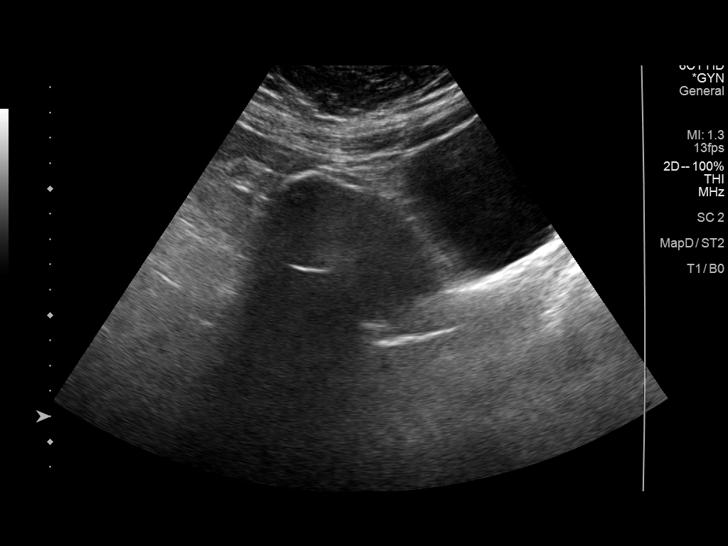
[im 4/44]
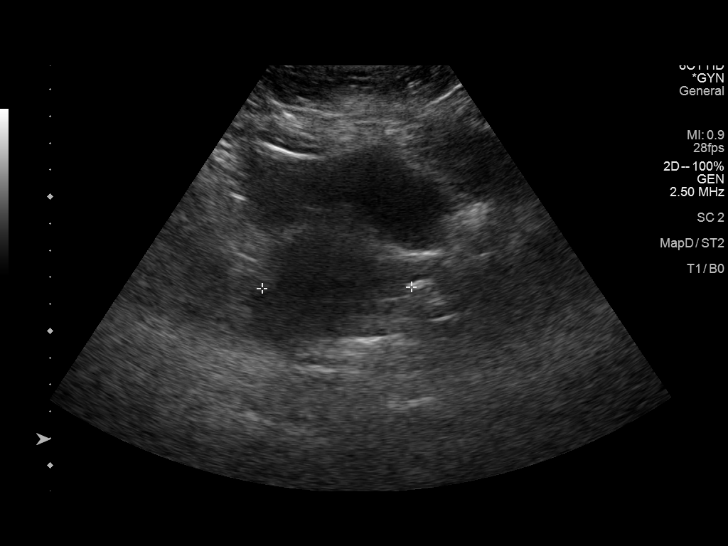
[im 8/44]
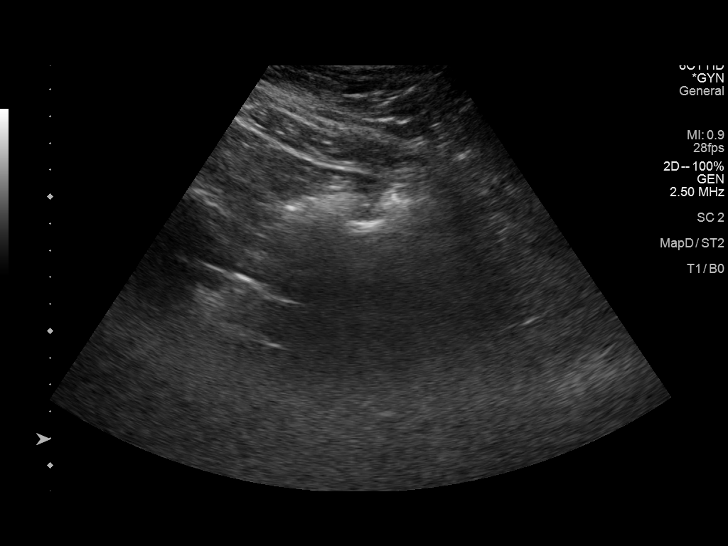
[im 11/44]
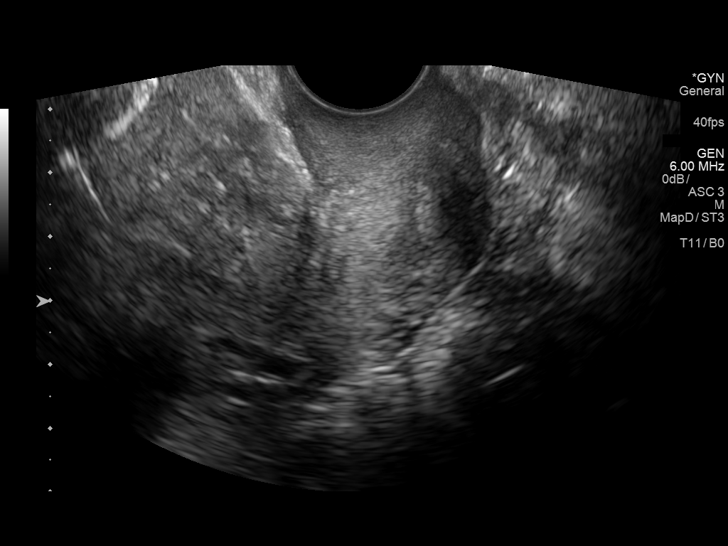
[im 15/44]
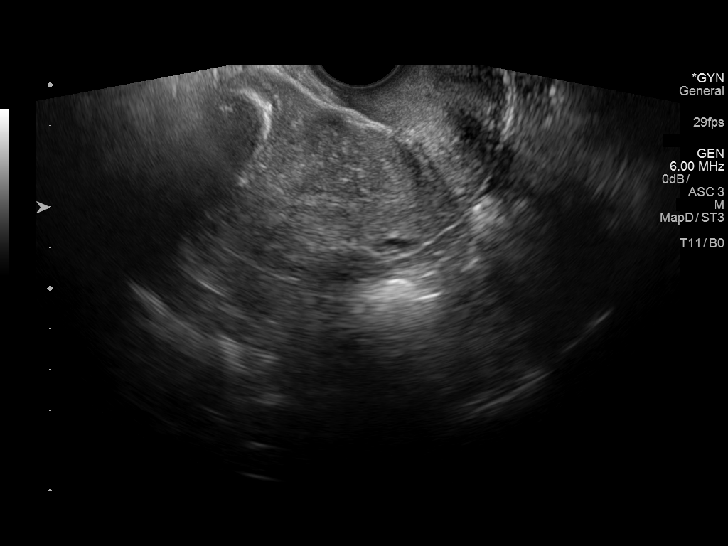
[im 18/44]
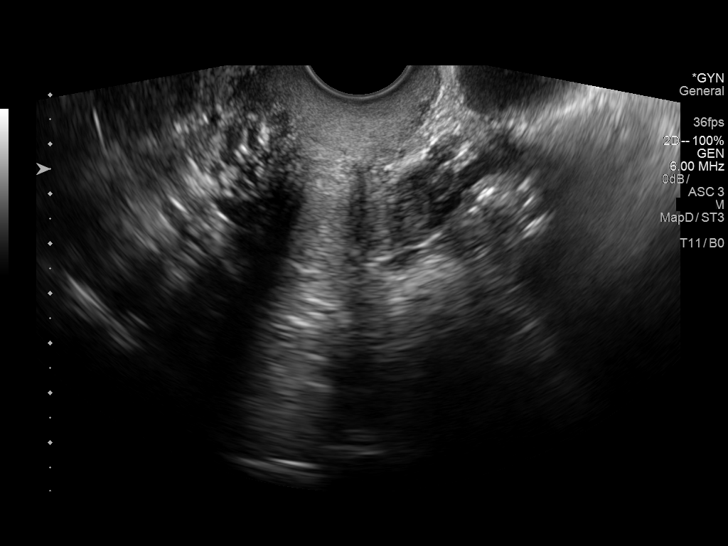
[im 22/44]
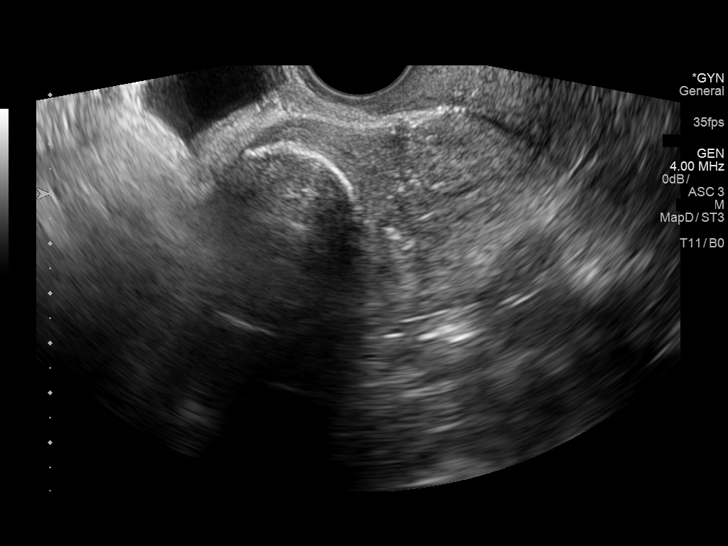
[im 26/44]
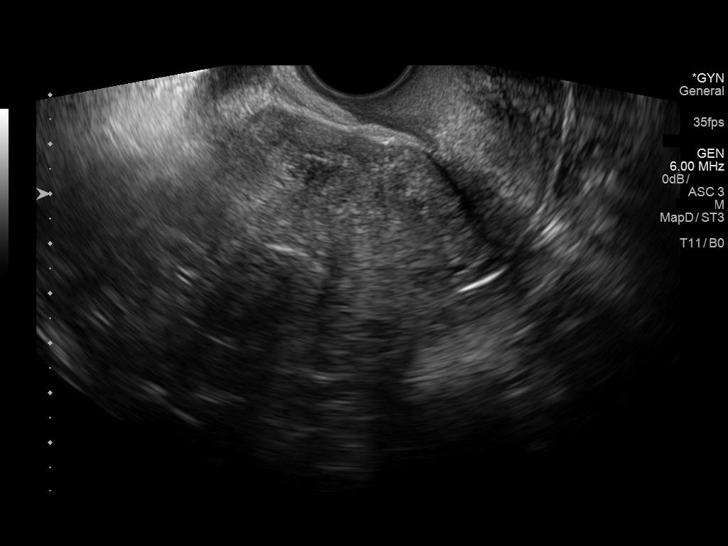
[im 29/44]
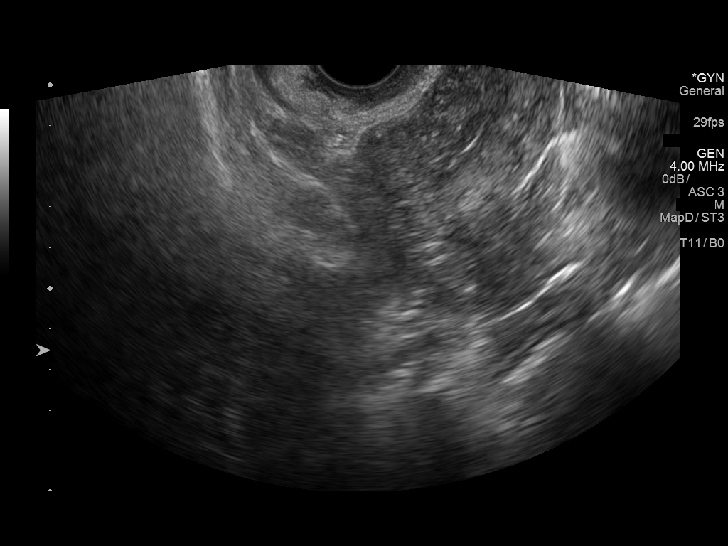
[im 33/44]
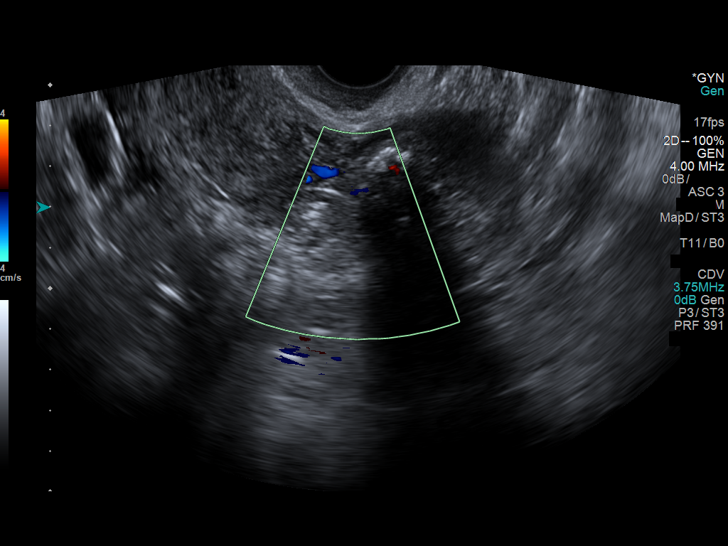
[im 36/44]
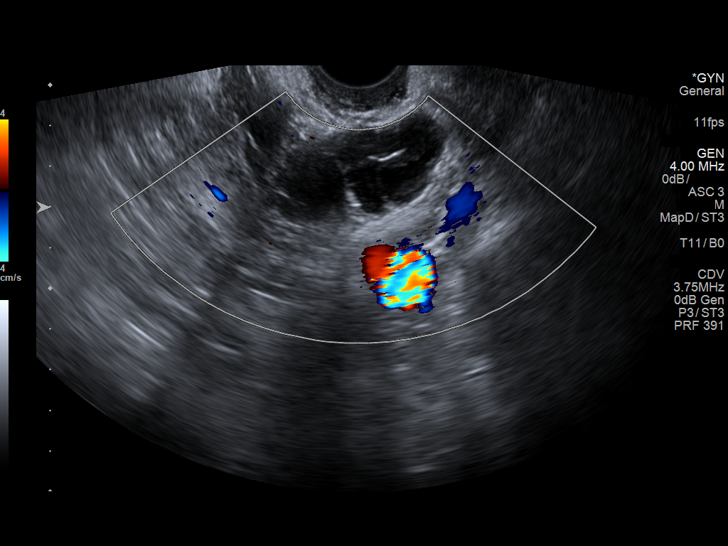
[im 40/44]
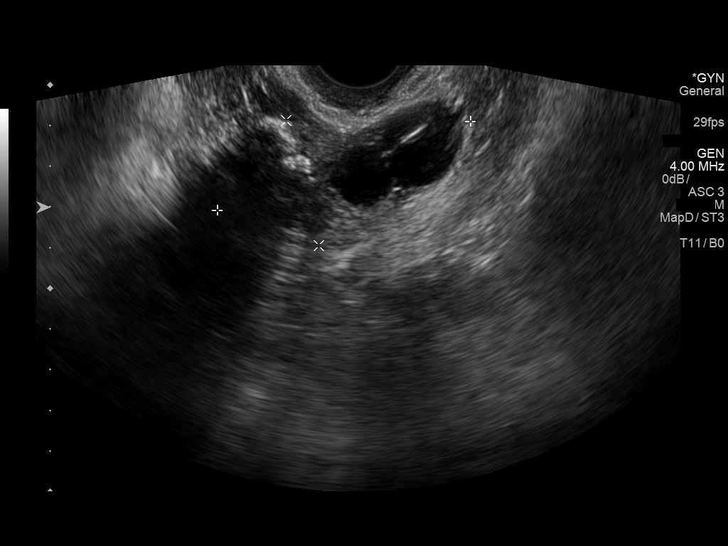
[im 44/44]
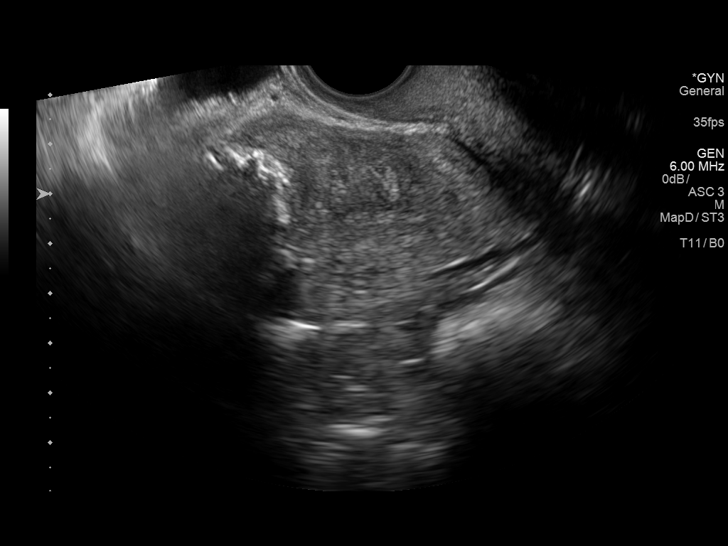

[13 of 25 positions shown; findings below may reference images not displayed]

FINDINGS: Uterus

Measurements: 8.1 x 4.3 x 7.1 cm. There is a large calcified fibroid
near the fundus measuring 3.4 x 3.8 x 3.8 cm. The calcified region
described in the left adnexum by the sonographer was noted to most
likely be an exophytic fibroid uterus based on CT imaging.

Endometrium

Thickness: 3.6 mm.  No focal abnormality visualized.

Right ovary

Not visualized.

Left ovary

There is a complex cystic mass in the left adnexum measuring 5 x
x 2.1 cm with multiple septations and suggestion of mild internal
blood flow. The mass measuring 2.4 x 2 x 1.9 cm adjacent to the left
cystic region is calcified and was thought to represent an exophytic
fibroid on uterus on recent CT imaging.

Other findings

No abnormal free fluid.
IMPRESSION: 1. There is a complex cystic mass in the left adnexum measuring 5 x
2.4 x 2.1 cm. A separate ovary is not seen and the cystic mass may
be arising from the ovary. This is not a normal appearing ovary in a
patient of this age and a cystic neoplasm is considered most likely.
Recommend surgical consultation and consideration of a pelvic MRI.
2. A calcified mass adjacent to the left adnexal cystic mass may
represent an exophytic fibroid off the uterus as described on recent
CT imaging. Recommend attention to this region on pelvic MRI.
3. Large calcified fibroid in the fundus of the uterus.
4. The right ovary was not visualized.
These results will be called to the ordering clinician or
representative by the Radiologist Assistant, and communication
documented in the PACS or zVision Dashboard.

## 2018-10-04 ENCOUNTER — Other Ambulatory Visit: Payer: Self-pay | Admitting: Gynecology

## 2018-10-04 DIAGNOSIS — Z1231 Encounter for screening mammogram for malignant neoplasm of breast: Secondary | ICD-10-CM

## 2018-10-28 DIAGNOSIS — E1140 Type 2 diabetes mellitus with diabetic neuropathy, unspecified: Secondary | ICD-10-CM | POA: Insufficient documentation

## 2018-10-28 DIAGNOSIS — M519 Unspecified thoracic, thoracolumbar and lumbosacral intervertebral disc disorder: Secondary | ICD-10-CM | POA: Insufficient documentation

## 2018-11-08 ENCOUNTER — Ambulatory Visit: Payer: BLUE CROSS/BLUE SHIELD

## 2018-11-24 ENCOUNTER — Encounter: Payer: BLUE CROSS/BLUE SHIELD | Admitting: Gynecology

## 2018-12-07 ENCOUNTER — Ambulatory Visit: Payer: Self-pay

## 2019-06-29 DIAGNOSIS — E119 Type 2 diabetes mellitus without complications: Secondary | ICD-10-CM | POA: Insufficient documentation

## 2019-07-11 ENCOUNTER — Encounter: Payer: Self-pay | Admitting: Gynecology

## 2019-12-13 ENCOUNTER — Encounter: Payer: Self-pay | Admitting: Family Medicine

## 2019-12-13 ENCOUNTER — Ambulatory Visit (INDEPENDENT_AMBULATORY_CARE_PROVIDER_SITE_OTHER): Payer: Self-pay | Admitting: Family Medicine

## 2019-12-13 ENCOUNTER — Other Ambulatory Visit: Payer: Self-pay

## 2019-12-13 VITALS — BP 142/82 | HR 55 | Wt 203.4 lb

## 2019-12-13 DIAGNOSIS — Z7689 Persons encountering health services in other specified circumstances: Secondary | ICD-10-CM

## 2019-12-13 DIAGNOSIS — I10 Essential (primary) hypertension: Secondary | ICD-10-CM

## 2019-12-13 DIAGNOSIS — N83202 Unspecified ovarian cyst, left side: Secondary | ICD-10-CM

## 2019-12-13 DIAGNOSIS — E785 Hyperlipidemia, unspecified: Secondary | ICD-10-CM

## 2019-12-13 DIAGNOSIS — F4329 Adjustment disorder with other symptoms: Secondary | ICD-10-CM

## 2019-12-13 DIAGNOSIS — E119 Type 2 diabetes mellitus without complications: Secondary | ICD-10-CM

## 2019-12-13 LAB — POCT GLYCOSYLATED HEMOGLOBIN (HGB A1C): Hemoglobin A1C: 7.8 % — AB (ref 4.0–5.6)

## 2019-12-13 NOTE — Progress Notes (Signed)
    SUBJECTIVE:   CHIEF COMPLAINT / HPI: establish care   Rachael Garcia is a 64 year old female presenting to establish care and discuss the following:  Weight concerns, she is frustrated that she has not lost any weight recently.  Has been more sedentary due to the pandemic.  However has been watching her diet very closely in efforts to keep her diabetes controlled.  Also reports this time of the year is hard for her, her husband passed away 8 years ago in Mar 12, 2023 however started declining in March of that year.  Otherwise "happy-go-lucky"on a normal basis.  No SI or HI.  Past medical history:  Hypertension> SBP usually 130 at home  Allergic rhinitis  GERD  Sickle cell trait  Left ovarian cyst>>Removed 2019  IBS  Diabetes: Last a1c 7.3, Checks CBG every so often, last CBG 92, hasn't had one over 120.   Past surgical history: C-section in 1992 and 1983, tubal ligation in 1992, Bilateral tube/ovaries removal in 2019 due to ovary cyst (concern was malignant, however returned to be benign), carpal tunnel surgery.   Medications: Zyrtec 10 mg daily, amlodipine 10 mg daily, zinc/vitamin D/turmeric daily,  Allergies: Penicillin, seems to have difficulty only with high-dose  Social: Lives with significant other and her daughter.  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  Uterus still present, last menstrual cycle 10 years ago.  Preventative care: Had a normal mammogram last year at [facility] (not in chart), Pap in 2019 normal, DEXA in 2018, colonoscopy over 10 years ago.  Last eye exam in 10/2018, normal.   OBJECTIVE:   BP (!) 142/82   Pulse (!) 55   Wt 203 lb 6.4 oz (92.3 kg)   SpO2 97%   BMI 33.85 kg/m    General: Alert, NAD HEENT: NCAT, MMM Cardiac: RRR no m/g/r Lungs: Clear bilaterally, no increased WOB  Abdomen: soft, non-tender Msk: Moves all extremities spontaneously  Ext: Warm, dry, 2+ distal pulses, no edema     ASSESSMENT/PLAN:   Encounter to establish care with new doctor Reviewed medications, past medical, surgical, and social history with patient.  Reviewed overdue health maintenance, she will be scheduling a diabetic eye exam with [facility], last seen on 10/2018.  On follow-up, will obtain release of records for mammogram in 2020.  Type 2 diabetes mellitus without complication, without long-term current use of insulin (HCC) Last [name] 7.3 in 08/2019.  Currently diet controlled, will obtain repeat [name].  Could consider starting medication on follow-up visit, considerations for Ozempic or SGLT2 inhibitor due to concurrent desire for weight loss.  Will do foot exam on follow-up.  Essential hypertension Reasonable control with amlodipine 10mg , reports average SBP 130 at home.   Anniversary reaction Husband passed away 8 years ago around this time of year.  Provided supportive listening and relaxation techniques.   Hyperlipidemia LDL 155, HDL 66 in 08/2019.  ASCVD 10-year risk 22%, should be on high intensity statin.  Reports trialing this in the past and discontinued due to side effects.  Will broach this topic on follow-up.    Follow-up in a few weeks to discuss diabetes/hyperlipidemia as above or sooner if needed.  Patriciaann Clan, [place]

## 2019-12-13 NOTE — Patient Instructions (Addendum)
It was wonderful to meet you today.  Please make sure you schedule a follow-up visit with your eye doctor for diabetic eye exam.  We will also check your [name] to see where your diabetes is.  Lets have you come back in approximately 1 month or sooner if needed to further discuss your diabetes and see how you are doing with your diet and activity.

## 2019-12-14 ENCOUNTER — Encounter: Payer: Self-pay | Admitting: Family Medicine

## 2019-12-14 ENCOUNTER — Telehealth: Payer: Self-pay

## 2019-12-14 DIAGNOSIS — Z7689 Persons encountering health services in other specified circumstances: Secondary | ICD-10-CM | POA: Insufficient documentation

## 2019-12-14 DIAGNOSIS — F4329 Adjustment disorder with other symptoms: Secondary | ICD-10-CM

## 2019-12-14 DIAGNOSIS — E119 Type 2 diabetes mellitus without complications: Secondary | ICD-10-CM | POA: Insufficient documentation

## 2019-12-14 HISTORY — DX: Adjustment disorder with other symptoms: F43.29

## 2019-12-14 NOTE — Assessment & Plan Note (Signed)
LDL 155, HDL 66 in 08/2019.  ASCVD 10-year risk 22%, should be on high intensity statin.  Reports trialing this in the past and discontinued due to side effects.  Will broach this topic on follow-up.

## 2019-12-14 NOTE — Assessment & Plan Note (Signed)
Reasonable control with amlodipine 10mg , reports average SBP 130 at home.

## 2019-12-14 NOTE — Assessment & Plan Note (Signed)
Reviewed medications, past medical, surgical, and social history with patient.  Reviewed overdue health maintenance, she will be scheduling a diabetic eye exam with [facility], last seen on 10/2018.  On follow-up, will obtain release of records for mammogram in 2020.

## 2019-12-14 NOTE — Telephone Encounter (Signed)
Informed patient of results for [name] and asked that she call office to make an appointment to discuss medications in the next 3 weeks.  Rachael Garcia, [place]

## 2019-12-14 NOTE — Assessment & Plan Note (Signed)
Husband passed away 8 years ago around this time of year.  Provided supportive listening and relaxation techniques.

## 2019-12-14 NOTE — Assessment & Plan Note (Signed)
Last [name] 7.3 in 08/2019.  Currently diet controlled, will obtain repeat [name].  Could consider starting medication on follow-up visit, considerations for Ozempic or SGLT2 inhibitor due to concurrent desire for weight loss.  Will do foot exam on follow-up.

## 2020-01-10 ENCOUNTER — Ambulatory Visit (INDEPENDENT_AMBULATORY_CARE_PROVIDER_SITE_OTHER): Payer: 59 | Admitting: Family Medicine

## 2020-01-10 ENCOUNTER — Encounter: Payer: Self-pay | Admitting: Family Medicine

## 2020-01-10 ENCOUNTER — Other Ambulatory Visit: Payer: Self-pay

## 2020-01-10 VITALS — BP 136/84 | HR 71 | Ht 65.0 in | Wt 203.0 lb

## 2020-01-10 DIAGNOSIS — E785 Hyperlipidemia, unspecified: Secondary | ICD-10-CM

## 2020-01-10 DIAGNOSIS — E119 Type 2 diabetes mellitus without complications: Secondary | ICD-10-CM | POA: Diagnosis not present

## 2020-01-10 DIAGNOSIS — M25511 Pain in right shoulder: Secondary | ICD-10-CM | POA: Diagnosis not present

## 2020-01-10 MED ORDER — DICLOFENAC SODIUM 50 MG PO TBEC: 50.0000 mg | DELAYED_RELEASE_TABLET | Freq: Two times a day (BID) | ORAL | 0 refills | Status: AC

## 2020-01-10 MED ORDER — OZEMPIC (0.25 OR 0.5 MG/DOSE) 2 MG/1.5ML ~~LOC~~ SOPN: 0.2500 mg | PEN_INJECTOR | SUBCUTANEOUS | 1 refills | Status: DC

## 2020-01-10 MED ORDER — OZEMPIC (0.25 OR 0.5 MG/DOSE) 2 MG/1.5ML ~~LOC~~ SOPN: 0.5000 mg | PEN_INJECTOR | SUBCUTANEOUS | 0 refills | Status: DC

## 2020-01-10 NOTE — Patient Instructions (Signed)
Wonderful to see you! We will start ozempic which is a weekly injection. Please keep considering a statin medication (crestor/lipitor) and we can start this whenever.   I would like to see you back in 1 month or sooner if needed.

## 2020-01-10 NOTE — Progress Notes (Signed)
SUBJECTIVE:   CHIEF COMPLAINT / HPI: Follow-up  Azaryah is a 64 year old female presenting for follow-up.  She recently established care within our clinic on 3/4.  At that time she was frustrated with not being able to lose any weight.  She has been trying to be better about her diet and staying active.  Diabetes: Her [name] increased to 7.8 from 7.3 in 08/2019.  She is not currently on any medications.  She has tried Metformin in the past and had significant GI upset/nausea, does not want to try this again.  She would be willing to start an injectable medication.  Hyperlipidemia: LDL recently was 155 with an ASCVD 10-year risk of 22%.  She has talked with her previous provider about starting a statin medication, however was leery about this in the past.  Shoulder pain: She reports her right shoulder has been hurting for the past 2 weeks, feels identical to previous episode of bursitis.  Pain around the top/side of shoulder.  Worse with lifting her arm up/out to the side, however does find relief once her arm is completely elevated.  She has been using ibuprofen with some relief on occasion.  No trauma.  Not interested in a joint injection today.  Denies any popping, locking, weakness, numbness/tingling.  She also has chronic hip osteoarthritis that occasionally flares.  She has a temporary disability form that she would like filled out today.   PERTINENT  PMH / PSH: Hypertension, IBS, type 2 diabetes, left ovarian cyst s/p removal 2019, anxiety  OBJECTIVE:   BP 136/84   Pulse 71   Ht 5\' 5"  (1.651 m)   Wt 203 lb (92.1 kg)   SpO2 98%   BMI 33.78 kg/m   General: Alert, NAD HEENT: NCAT, MMM Cardiac: RRR no m/g/r Lungs: no increased WOB  Msk: Moves all extremities spontaneously Shoulder: Inspection reveals no obvious deformity, atrophy, or asymmetry. No bruising. No swelling Palpation is normal with no TTP over [facility] joint or bicipital groove. Full ROM in flexion, abduction,  internal/external rotation but sore with abduction/extension  NV intact distally Normal scapular function observed. Special Tests:  - Impingement: Pain with Hawkins, negative neers - Supraspinatous: Negative empty can.  5/5 strength with resisted flexion at 20 degrees - Infraspinatous/Teres Minor: 5/5 strength with ER - Subscapularis: 5/5 strength with IR - Labrum: Negative Obriens, good stability - AC Joint: Negative cross arm - No drop arm sign  Ext: Warm, dry, no edema   ASSESSMENT/PLAN:   Type 2 diabetes mellitus without complication, without long-term current use of insulin (HCC) [name] 7.8.  Previous intolerance to Metformin. After discussion of options, patient would like to try Ozempic for additional weight loss benefits and weekly dosing (Cr wnl in 08/2019).  Dr. Valentina Lucks was able to demonstrate how to administer injection in the office and patient provided with sample/month supply.  Counseled on common side effects, will start with 0.25 mg weekly for the next 4 weeks and likely increase to 0.5 afterwards.  Encouraged her to continue working towards a well-balanced diet and staying physically active as she can, discussed trying water aerobics given her known osteoarthritis.  Hyperlipidemia Discussed starting statin due to elevated ASCVD 10-year risk, counseled on benefits and common side effects associated with this class.  She would like to research this medication further and let me know if she would like to start this.  If decides to start, will likely place on Crestor.  Shoulder pain, right 2-week history, identical to previous episodes  of bursitis.  Generally unremarkable shoulder exam, however with tenderness on ROM and mildly painful Hawkins.  Not interested in injection today.  Will trial short NSAID course, sent in diclofenac 50 mg twice daily for the next 7 days.  Encouraged ice/heat as needed.  Follow-up if not improving or sooner if worsening.    Follow-up in 1 month for above  or sooner if needed.  Patriciaann Clan, [place]

## 2020-01-11 ENCOUNTER — Encounter: Payer: Self-pay | Admitting: Family Medicine

## 2020-01-11 DIAGNOSIS — M25511 Pain in right shoulder: Secondary | ICD-10-CM | POA: Insufficient documentation

## 2020-01-11 HISTORY — DX: Pain in right shoulder: M25.511

## 2020-01-11 NOTE — Assessment & Plan Note (Signed)
2-week history, identical to previous episodes of bursitis.  Generally unremarkable shoulder exam, however with tenderness on ROM and mildly painful Hawkins.  Not interested in injection today.  Will trial short NSAID course, sent in diclofenac 50 mg twice daily for the next 7 days.  Encouraged ice/heat as needed.  Follow-up if not improving or sooner if worsening.

## 2020-01-11 NOTE — Assessment & Plan Note (Addendum)
Discussed starting statin due to elevated ASCVD 10-year risk, counseled on benefits and common side effects associated with this class.  She would like to research this medication further and let me know if she would like to start this.  If decides to start, will likely place on Crestor.

## 2020-01-11 NOTE — Assessment & Plan Note (Addendum)
[name] 7.8.  Previous intolerance to Metformin. After discussion of options, patient would like to try Ozempic for additional weight loss benefits and weekly dosing (Cr wnl in 08/2019).  Dr. Valentina Lucks was able to demonstrate how to administer injection in the office and patient provided with sample/month supply.  Counseled on common side effects, will start with 0.25 mg weekly for the next 4 weeks and likely increase to 0.5 afterwards.  Encouraged her to continue working towards a well-balanced diet and staying physically active as she can, discussed trying water aerobics given her known osteoarthritis.

## 2020-03-05 ENCOUNTER — Encounter: Payer: Self-pay | Admitting: Family Medicine

## 2020-03-05 LAB — HM DIABETES EYE EXAM

## 2020-03-07 ENCOUNTER — Encounter: Payer: Self-pay | Admitting: Family Medicine

## 2020-03-07 ENCOUNTER — Ambulatory Visit (INDEPENDENT_AMBULATORY_CARE_PROVIDER_SITE_OTHER): Payer: 59 | Admitting: Family Medicine

## 2020-03-07 ENCOUNTER — Other Ambulatory Visit: Payer: Self-pay

## 2020-03-07 VITALS — BP 122/86 | HR 82 | Ht 65.0 in | Wt 201.8 lb

## 2020-03-07 DIAGNOSIS — R1011 Right upper quadrant pain: Secondary | ICD-10-CM

## 2020-03-07 DIAGNOSIS — E119 Type 2 diabetes mellitus without complications: Secondary | ICD-10-CM | POA: Diagnosis not present

## 2020-03-07 DIAGNOSIS — G8929 Other chronic pain: Secondary | ICD-10-CM

## 2020-03-07 DIAGNOSIS — M25511 Pain in right shoulder: Secondary | ICD-10-CM

## 2020-03-07 DIAGNOSIS — I10 Essential (primary) hypertension: Secondary | ICD-10-CM | POA: Diagnosis not present

## 2020-03-07 LAB — POCT GLYCOSYLATED HEMOGLOBIN (HGB A1C): HbA1c, POC (controlled diabetic range): 6.6 % (ref 0.0–7.0)

## 2020-03-07 MED ORDER — MELOXICAM 15 MG PO TABS: 15.0000 mg | ORAL_TABLET | Freq: Every day | ORAL | 0 refills | Status: AC

## 2020-03-07 NOTE — Progress Notes (Signed)
SUBJECTIVE:   CHIEF COMPLAINT / HPI: F/u T2DM   [name] is a 64 year old female presenting to discuss the following:  T2DM: Last [name] 7.8 on 12/13/2019.  She was started on Ozempic at that time.  Additionally last visit discussed starting statin due to elevated ASCVD risk, however opted to continue research. She is due for her foot exam.  Today, she reports she tolerated the Ozempic very well however ran out of her sample.  States the pen was going to cost her $800 through her insurance so she did not pick up the prescription.  Going to be applying for Medicare in the next few months for when she turns 89.  Says she has been splurging on more sweets recently.  Previous intolerance to Metformin.  Right upper quadrant pain: Present intermittently for the past 2-3 months.  Mainly notices when she eats a large meal, has not necessarily noticed it with more fatty foods.  She also notices that sometimes with certain movements.  States it will feel sore, "like I did a whole bunch of sit ups."  Has gallbladder.  Reports previous LFT elevation and is worried about her liver.  Does not drink alcohol in excess.  No associated fever, nausea, vomiting, diarrhea/constipation, or heartburn symptoms.  Present prior to starting a GLP-1 agonist.  Right shoulder pain: Has remained since seen last time in April.  Reports it feels identical to previous episode of bursitis in the past.  Soreness mainly around the top/side of her shoulder and worse with overhead activities and with abduction.  Denies any popping, locking, weakness, numbness/tingling, reduced motion.  She has not tried anything for this recently, did try diclofenac for 1 week back in April that helped a little.   Hypertension: On Norvasc.  Does not take her blood pressure at home.  PERTINENT  PMH / PSH: Hypertension, type 2 diabetes, seasonal allergies, IBS, sickle cell trait diabetic neuropathy  OBJECTIVE:   BP 122/86   Pulse 82   Ht 5\' 5"   (1.651 m)   Wt 201 lb 12.8 oz (91.5 kg)   SpO2 98%   BMI 33.58 kg/m   General: Alert, NAD HEENT: NCAT, MMM Cardiac: RRR no m/g/r Lungs: Clear bilaterally, no increased WOB  Abdomen: soft, nontender with a negative Murphy sign, nondistended, no hepatosplenomegaly palpated, normoactive bowel sounds throughout. Msk: Moves all extremities spontaneously  Right shoulder: No swelling, ecchymoses.  No gross deformity. No TTP. FROM, with exception of slightly reduced abduction at the top due to pain. No pain with cross arm adduction test.  Negative Hawkins, some tenderness with Neer's. Pain elicited with O'Brien's with thumb up and down Strength 5/5 with empty can and resisted internal/external rotation. NV intact distally. Ext: Warm, dry, 2+ distal pulses, no edema   Diabetic Foot Exam - Simple   Simple Foot Form Diabetic Foot exam was performed with the following findings: Yes 03/07/2020  2:00 PM  Visual Inspection No deformities, no ulcerations, no other skin breakdown bilaterally: Yes Sensation Testing Intact to touch and monofilament testing bilaterally: Yes Pulse Check Posterior Tibialis and Dorsalis pulse intact bilaterally: Yes Comments     ASSESSMENT/PLAN:   Type 2 diabetes mellitus without complication, without long-term current use of insulin (HCC) [name] 6.6 today, well controlled with Ozempic 0.25mg  dose. Able to provide additional sample of Ozempic today via pharmacy team as we work towards a more long-term solution.  Encouraged her to continue working towards a well-balanced diet with decreasing sweets and increasing her physical  activity as she can.  Will readdress starting a statin and obtain urine microalbumin on follow-up.  Shoulder pain, right Subacute/chronic, mainly with overhead activities and abduction.  Through brief exam and history, differential including impingement/bursitis, RTC tendinopathy, AC/Gh arthritis. Considered adhesive capsulitis given concurrent  diabetes, however has overall reassuring ROM on exam.  Rx Mobic 15 mg daily for 10 days, activity modifications, and ice/heat as needed.  Discussed initiating physical therapy +/-injection trial, however patient would like to trial conservative management prior to either of these.  RUQ pain Intermittent several month history. Predominant concern for biliary colic/cholelithiasis given pain distribution and worse after large meals. Discussed my concerns and recommended obtaining RUQ ultrasound to evaluate liver/gallbladder, however patient would like to hold off for the time being.  Decided with patient to proceed with CMP and lipase to evaluate liver function and rule out pancreatic inflammation.  Encouraged portion control to mitigate symptoms, return precautions including persistence of pain or increased frequency/severity of discomfort.   Essential hypertension Well-controlled on Norvasc 10 mg.  Can consider addition/transition to ACE/ARB in the future given concurrent diabetes.    Follow-up in 1 month or sooner if needed, patient instructed to call if she would like to start physical therapy or obtain a RUQ U/S.  Rachael Garcia, [place]

## 2020-03-07 NOTE — Progress Notes (Signed)
Medication Samples have been provided to the patient.  Drug name: Ozempic       Strength: 2 mg/1.5 mL        Qty: 1 pen  LOT: [id number]  Exp.Date: 07/2022  Dosing instructions: Administer 0.25 mg subQ once weekly   The patient has been instructed regarding the correct time, dose, and frequency of taking this medication, including desired effects and most common side effects.   Rachael Garcia 2:48 PM 03/07/2020

## 2020-03-07 NOTE — Patient Instructions (Addendum)
It was wonderful seeing you today!   We are checking on the Ozempic, I will let you know where we stand with this.  Please make sure you continue to take your Norvasc for your blood pressure.  For your abdominal pain, we will check your liver and kidney function today.  If it is not getting better, worsening, or happening more frequently please give me a call and we can get an ultrasound of that part of your belly to look at your liver and gallbladder.  Additionally, I will send in a short course of meloxicam to help with your shoulder.  You can continue to use ice/heat 20 minutes at a time several times a day.  If it is still not improving we can discuss moving forward with physical therapy.

## 2020-03-08 LAB — COMPREHENSIVE METABOLIC PANEL
ALT: 22 IU/L (ref 0–32)
AST: 22 IU/L (ref 0–40)
Albumin/Globulin Ratio: 2 (ref 1.2–2.2)
Albumin: 4.4 g/dL (ref 3.8–4.8)
Alkaline Phosphatase: 71 IU/L (ref 48–121)
BUN/Creatinine Ratio: 16 (ref 12–28)
BUN: 12 mg/dL (ref 8–27)
Bilirubin Total: 0.3 mg/dL (ref 0.0–1.2)
CO2: 24 mmol/L (ref 20–29)
Calcium: 9.1 mg/dL (ref 8.7–10.3)
Chloride: 106 mmol/L (ref 96–106)
Creatinine, Ser: 0.74 mg/dL (ref 0.57–1.00)
GFR calc Af Amer: 99 mL/min/{1.73_m2} (ref >59)
GFR calc non Af Amer: 86 mL/min/{1.73_m2} (ref >59)
Globulin, Total: 2.2 g/dL (ref 1.5–4.5)
Glucose: 90 mg/dL (ref 65–99)
Potassium: 4 mmol/L (ref 3.5–5.2)
Sodium: 142 mmol/L (ref 134–144)
Total Protein: 6.6 g/dL (ref 6.0–8.5)

## 2020-03-08 LAB — LIPASE: Lipase: 32 U/L (ref 14–72)

## 2020-03-09 ENCOUNTER — Encounter: Payer: Self-pay | Admitting: Family Medicine

## 2020-03-09 MED ORDER — OZEMPIC (0.25 OR 0.5 MG/DOSE) 2 MG/1.5ML ~~LOC~~ SOPN: 0.2500 mg | PEN_INJECTOR | SUBCUTANEOUS | 0 refills | Status: DC

## 2020-03-09 NOTE — Assessment & Plan Note (Signed)
Well-controlled on Norvasc 10 mg.  Can consider addition/transition to ACE/ARB in the future given concurrent diabetes.

## 2020-03-09 NOTE — Assessment & Plan Note (Addendum)
Intermittent several month history. Predominant concern for biliary colic/cholelithiasis given pain distribution and worse after large meals. Discussed my concerns and recommended obtaining RUQ ultrasound to evaluate liver/gallbladder, however patient would like to hold off for the time being.  Decided with patient to proceed with CMP and lipase to evaluate liver function and rule out pancreatic inflammation.  Encouraged portion control to mitigate symptoms, return precautions including persistence of pain or increased frequency/severity of discomfort.

## 2020-03-09 NOTE — Assessment & Plan Note (Addendum)
[name] 6.6 today, well controlled with Ozempic 0.25mg  dose. Able to provide additional sample of Ozempic today via pharmacy team as we work towards a more long-term solution.  Encouraged her to continue working towards a well-balanced diet with decreasing sweets and increasing her physical activity as she can.  Will readdress starting a statin and obtain urine microalbumin on follow-up.

## 2020-03-09 NOTE — Assessment & Plan Note (Signed)
Subacute/chronic, mainly with overhead activities and abduction.  Through brief exam and history, differential including impingement/bursitis, RTC tendinopathy, AC/Gh arthritis. Considered adhesive capsulitis given concurrent diabetes, however has overall reassuring ROM on exam.  Rx Mobic 15 mg daily for 10 days, activity modifications, and ice/heat as needed.  Discussed initiating physical therapy +/-injection trial, however patient would like to trial conservative management prior to either of these.

## 2020-03-12 ENCOUNTER — Encounter: Payer: Self-pay | Admitting: Family Medicine

## 2020-03-21 ENCOUNTER — Ambulatory Visit: Payer: 59 | Admitting: Physician Assistant

## 2020-04-12 ENCOUNTER — Telehealth: Payer: Self-pay | Admitting: Pharmacist

## 2020-04-12 NOTE — Telephone Encounter (Addendum)
Contacted patient to inquire about need for Ozempic samples.   No answer.  Left message to return call.

## 2020-04-12 NOTE — Telephone Encounter (Signed)
Patient returned call ~ 12:15  States her blood sugars under doing well - reports CBGs of 100-140 - at Ozempic dose of 0.25mg  weekly.   We discussed provision of adequate sample supply to cover > 2 months until she starts Medicare insurance after her Rudene Anda in September.   Medication Samples have been provided for the patient to pick up.   Drug name: Hilliard Clark: 2 pens  LOT: [id number]  Exp.Date: 08/11/2022  Dosing instructions: 0.25mg  once weekly.   The patient has been instructed regarding the correct time, dose, and frequency of taking this medication, including desired effects and most common side effects.   Janeann Forehand 04/12/2020  Patient plans to pick up next week as she has 1 dose remaining in current supply.  Labeled package in refrigerator.

## 2020-05-13 ENCOUNTER — Ambulatory Visit (INDEPENDENT_AMBULATORY_CARE_PROVIDER_SITE_OTHER): Payer: 59 | Admitting: Family Medicine

## 2020-05-13 ENCOUNTER — Encounter: Payer: Self-pay | Admitting: Family Medicine

## 2020-05-13 ENCOUNTER — Other Ambulatory Visit: Payer: Self-pay

## 2020-05-13 VITALS — BP 146/92 | HR 74 | Wt 201.4 lb

## 2020-05-13 DIAGNOSIS — E119 Type 2 diabetes mellitus without complications: Secondary | ICD-10-CM | POA: Diagnosis not present

## 2020-05-13 DIAGNOSIS — E785 Hyperlipidemia, unspecified: Secondary | ICD-10-CM | POA: Diagnosis not present

## 2020-05-13 DIAGNOSIS — F419 Anxiety disorder, unspecified: Secondary | ICD-10-CM

## 2020-05-13 DIAGNOSIS — I10 Essential (primary) hypertension: Secondary | ICD-10-CM

## 2020-05-13 DIAGNOSIS — R1011 Right upper quadrant pain: Secondary | ICD-10-CM

## 2020-05-13 NOTE — Patient Instructions (Signed)
Wonderful to see you!   For abdominal pain: smaller meals. We will schedule the RUQ abdominal ultrasound. Call/go to ED if severe worsening with vomiting, fever.   For anxiety: try melatonin 2-3 hours prior to sleeping every night. No phones/tv 1-2 hours before bedtime. Try lavender tea or bath prior to bedtime. Meditation is great-keep this up!    Therapy and Counseling Resources Most providers on this list will take Medicaid. Patients with commercial insurance or Medicare should contact their insurance company to get a list of in network providers.  [facility]  [address] [place], [place] [address]      [place] [address]  [place], [place] [address] [phone]  [place] [address], [place]  [place], [place] [address]       [phone]      [facility] [address] [place], [place]  [facility]:  [place]. [place]  [facility]:  [place] [address]oni Muse, [place]  [facility] (under & uninsured) [address], [place] [phone]    [email or web address]    [facility] [place]     Phone:  [phone]     [place]  [address]      [place], [place] ext [place]: [place], [place], [place]   [place] ([place] therapist) [address] [place] [address]   [place] [address]    [phone]    [facility]   [address] [place] [address],  [place], [place]   [place]  [place]  [facility]  [address] [place], [place]        [phone]  Open Access/Walk In Clinic under  & uninsured  [facility]  [address] [place], [place] Crisis [phone]  [facility] [place],  ([place])   [place], [address] [place]: [phone]) 8:30 - 12; 1 - 2:30  [facility] of the [place],  [place], [place]    ([phone]):8:30 - 12; 2 - 3PM  [facility] [employer],  [address]  [place]; [phone]):   Mon - Fri 8 AM - 5 PM  [facility] [place]  MWF 12:30 to 3:00 or call to schedule an appointment  [phone]  Specific Provider options Psychology Today  [email or web address]/us 1. click on find a therapist  2. enter your zip code 3. left side and select or tailor a therapist for your specific need.   [facility] Provider Directory [email or web address]  (Medicaid)   Follow all drop down to find a provider  [place] [phone] or [email or web address] [address], [place], [place] Recovery support and educational   24- Hour Availability:  .  Marland Kitchen [facility]  . [place], [place] Crisis [phone]  . [facility] of the [employer] [phone]  [facility] Crisis Service  [phone]   . [place]  [phone] (after hours)  . Therapeutic Alternative/Mobile Crisis  [phone]  . [place] National Suicide Hotline  732-189-4600 ([place])  . Call 911 or go to emergency room  . [employer]  [phone]);  [place] and [employer]   . [place] ACCESS  [phone]); [place], [place], [place], [place], [place], [place], [place]

## 2020-05-13 NOTE — Progress Notes (Signed)
    SUBJECTIVE:   CHIEF COMPLAINT / HPI: Stomach issues  Stomach pain: Last seen in 02/2020 and reported a several month history of intermittent RUQ pain then.  Recommended a RUQ ultrasound with labs, however she only want to proceed with labs.  Reassuringly lipase and CMP unremarkable.  Today, she reports that she continues to have these intermittent pains.  Predominantly after eating large meals.  She started eating smaller meals which has helped a little bit.  Feels like sometimes the pain radiates to her right back.  Reports is a dull ache.  Sometimes feels nauseous with it, but otherwise no fever, vomiting, change in bowel movements.  Anxiety: She recently has become increasingly stressed and anxious as her mom is struggling with dementia.  Her mother currently lives alone in [place].  She is constantly worrying about her taking care of herself and being able to contact her to make sure that she is okay.  Gad score of 4.  Has been getting poor sleep due to feeling anxious.  Not interfering with her daily activities.  She recently tried melatonin which did help if she took it several hours before bedtime.  Health maintenance: Due for Covid vaccine and urine microalbumin.  She is hesitant to get her vaccine as her brother had a stroke the day after the vaccine.   PERTINENT  PMH / PSH: Hypertension, type 2 diabetes, seasonal allergies, IBS, sickle cell trait, diabetic neuropathy  OBJECTIVE:   BP (!) 146/92   Pulse 74   Wt 201 lb 6.4 oz (91.4 kg)   SpO2 98%   BMI 33.51 kg/m   General: Alert, NAD HEENT: NCAT, MMM Cardiac: RRR no m/g/r Lungs: Clear bilaterally, no increased WOB  Abdomen: soft, mild tenderness to palpation of RUQ with negative Murphy sign without rebounding or guarding, non-distended, normoactive BS, no CVA tenderness bilaterally Msk: Moves all extremities spontaneously  Psych: Normal mood and affect  ASSESSMENT/PLAN:   RUQ pain Predominantly after large meals,  persistent for the past several months.  Lipase and hepatic function WNL during previous visit for this concern.  Suspect precipitating biliary colic/cholelithiasis and will proceed with RUQ U/S after discussion with patient.  ED precautions including worsening/uncontrolled pain with any associated fever/vomiting.  Encouraged smaller meals for the time being and avoiding fatty foods.  Essential hypertension Slightly elevated today, otherwise has been well controlled.  Suspect related to her increased stress level currently, will monitor for now.  Continue Norvasc 10 mg, consider ACE/ARB on follow-up if still elevated.  Type 2 diabetes mellitus without complication, without long-term current use of insulin ([place]) Well-controlled, last [name] 6.6 in 02/2020.  Due for next [name] in 1 month.  Will obtain screening urine microalbumin today.  Hyperlipidemia Elevated ASCVD risk.  Will discuss starting statin therapy on follow-up, patient has been hesitant during several conversations before.  Anxiety Related to her mother's declining mental status. GAD 4. Provided supportive listening and encouragement.  Discussed continued use of melatonin 2-3 hours prior to bedtime and establishing with counseling, provided resources.    Follow-up in 1 month to check in on above and diabetes.  Rachael Garcia, Rachael Garcia

## 2020-05-15 ENCOUNTER — Encounter: Payer: Self-pay | Admitting: Family Medicine

## 2020-05-15 DIAGNOSIS — F419 Anxiety disorder, unspecified: Secondary | ICD-10-CM | POA: Insufficient documentation

## 2020-05-15 LAB — MICROALBUMIN / CREATININE URINE RATIO
Creatinine, Urine: 149.2 mg/dL
Microalb/Creat Ratio: 11 mg/g creat (ref 0–29)
Microalbumin, Urine: 16.3 ug/mL

## 2020-05-15 NOTE — Assessment & Plan Note (Signed)
Elevated ASCVD risk.  Will discuss starting statin therapy on follow-up, patient has been hesitant during several conversations before.

## 2020-05-15 NOTE — Assessment & Plan Note (Signed)
Well-controlled, last [name] 6.6 in 02/2020.  Due for next [name] in 1 month.  Will obtain screening urine microalbumin today.

## 2020-05-15 NOTE — Assessment & Plan Note (Signed)
Related to her mother's declining mental status. GAD 4. Provided supportive listening and encouragement.  Discussed continued use of melatonin 2-3 hours prior to bedtime and establishing with counseling, provided resources.

## 2020-05-15 NOTE — Assessment & Plan Note (Signed)
Predominantly after large meals, persistent for the past several months.  Lipase and hepatic function WNL during previous visit for this concern.  Suspect precipitating biliary colic/cholelithiasis and will proceed with RUQ U/S after discussion with patient.  ED precautions including worsening/uncontrolled pain with any associated fever/vomiting.  Encouraged smaller meals for the time being and avoiding fatty foods.

## 2020-05-15 NOTE — Assessment & Plan Note (Signed)
Slightly elevated today, otherwise has been well controlled.  Suspect related to her increased stress level currently, will monitor for now.  Continue Norvasc 10 mg, consider ACE/ARB on follow-up if still elevated.

## 2020-05-16 ENCOUNTER — Encounter: Payer: Self-pay | Admitting: Family Medicine

## 2020-07-17 ENCOUNTER — Ambulatory Visit: Payer: 59 | Admitting: Family Medicine

## 2020-07-18 ENCOUNTER — Encounter: Payer: Self-pay | Admitting: Family Medicine

## 2020-07-18 ENCOUNTER — Other Ambulatory Visit: Payer: Self-pay

## 2020-07-18 ENCOUNTER — Ambulatory Visit (INDEPENDENT_AMBULATORY_CARE_PROVIDER_SITE_OTHER): Payer: Self-pay | Admitting: Family Medicine

## 2020-07-18 VITALS — BP 122/62 | HR 93 | Ht 65.0 in | Wt 202.0 lb

## 2020-07-18 DIAGNOSIS — E119 Type 2 diabetes mellitus without complications: Secondary | ICD-10-CM

## 2020-07-18 DIAGNOSIS — R1011 Right upper quadrant pain: Secondary | ICD-10-CM

## 2020-07-18 DIAGNOSIS — M519 Unspecified thoracic, thoracolumbar and lumbosacral intervertebral disc disorder: Secondary | ICD-10-CM

## 2020-07-18 DIAGNOSIS — L439 Lichen planus, unspecified: Secondary | ICD-10-CM

## 2020-07-18 DIAGNOSIS — I10 Essential (primary) hypertension: Secondary | ICD-10-CM

## 2020-07-18 LAB — POCT GLYCOSYLATED HEMOGLOBIN (HGB A1C): HbA1c, POC (controlled diabetic range): 6.4 % (ref 0.0–7.0)

## 2020-07-18 MED ORDER — DICLOFENAC SODIUM 1 % EX GEL: 4.0000 g | Freq: Four times a day (QID) | CUTANEOUS | 2 refills | Status: DC

## 2020-07-18 MED ORDER — BETAMETHASONE DIPROPIONATE 0.05 % EX CREA: TOPICAL_CREAM | Freq: Two times a day (BID) | CUTANEOUS | 1 refills | Status: DC

## 2020-07-18 NOTE — Progress Notes (Signed)
    SUBJECTIVE:   CHIEF COMPLAINT / HPI: Diabetes checkup  Ms. Rotundo is a 64 year old female presenting to discuss the following:  Diabetes: last [name] 6.6 in 02/2020.  She has been taking the Ozempic weekly without concern.  She fortunately got established through [facility] financial assistance for diabetes and hypertensive medications.  RUQ Pain: subsided and not present any longer, still after heavy meals but tries to avoid that at all possible.  Did not proceed with right upper quadrant ultrasound due to her insurance concerns.  Back pain: Thoracic/lumbar, when standing in line or standing still too long hurts on both side. Aching and sore. Years but flared up recently without any preceding injury or trauma. Takes tylenol extra strength and meloxicam to help.  She has not tried physical therapy.  Looking into starting Silver sneakers at the [facility].  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  Reports it has been very itchy recently and would like something to make this better.  She has not followed with a dermatologist in quite some time for this concern.  PERTINENT  PMH / PSH: Hypertension, type 2 diabetes, IBS, sickle cell trait, anxiety, right upper quadrant pain, lichen planus  OBJECTIVE:   BP 122/62   Pulse 93   Ht 5\' 5"  (1.651 m)   Wt 202 lb (91.6 kg)   SpO2 99%   BMI 33.61 kg/m   General: Alert, NAD HEENT: NCAT, MMM Lungs: No increased WOB  Abdomen: soft, non-tender throughout Msk: Lumbar spine: - Inspection: no gross deformity or asymmetry, swelling or ecchymosis - Palpation: No TTP over the spinous processes, paraspinal muscles  - ROM: full active ROM of the lumbar spine in flexion and extension with mild elicted soreness with rotation/sidebending - Strength: 5/5 strength of lower extremity in L4-S1 nerve root distributions b/l; normal gait Ext: Warm, dry, 2+ distal pulses, diffuse scattered circular violaceous flat topped plaques approximately ranging 0.5-1 cm in size  throughout bilateral lower legs   ASSESSMENT/PLAN:   Essential hypertension Well controlled. Continue norvasc 10mg  as is.   Type 2 diabetes mellitus without complication, without long-term current use of insulin ([facility]) [name] 6.4 today, well controlled. Continue Ozempic as is, tolerating well.   Lumbosacral disc disease Chronic, currently more bothersome than usual. However, unremarkable MSK exam today. Recommended heat, stretches/exercises (provided today), and voltaren gel. Additionally encouraged starting silver sneakers and/or water aerobics.    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 Previously followed with derm for this concern, however does not wish to establish again currently. Rx'd betamethasone cream BID for the next few weeks.   RUQ pain Largely resolved, still suspect 2/2 biliary colic however she has managed with dietary changes. Will continue to recommend U/S especially if returns (not interested in an elective surgery at this time anyways) and she is aware of ED precautions including more sever abdominal pain with associated N/V/fatigue or fever.     F/u in 3 months for above or sooner if needed.   Patriciaann Clan, [place]

## 2020-07-18 NOTE — Patient Instructions (Addendum)
It was wonderful to see you today.  Good job keeping up with your diabetes, your [name] looks great.  You can continue your Ozempic as you have been doing.  For your back pain, I encourage you to try heating pad 20 minutes at a time several times a day and then you can also try the Voltaren gel on the area up to 4 times a day.  I also would recommend getting into Silver sneakers and trying water aerobics, if you are not able to get in or still having minimal benefit we can talk about getting you in for therapy pending price.  For your skin I have sent in a high strength steroid to help with inflammation and itching.  In between the times you can use this I encourage you to try Benadryl cream, calamine lotion to help with the itching.

## 2020-07-20 ENCOUNTER — Encounter: Payer: Self-pay | Admitting: Family Medicine

## 2020-07-20 DIAGNOSIS — L439 Lichen planus, unspecified: Secondary | ICD-10-CM | POA: Insufficient documentation

## 2020-07-20 NOTE — Assessment & Plan Note (Signed)
Chronic, currently more bothersome than usual. However, unremarkable MSK exam today. Recommended heat, stretches/exercises (provided today), and voltaren gel. Additionally encouraged starting silver sneakers and/or water aerobics.

## 2020-07-20 NOTE — Assessment & Plan Note (Addendum)
Chronic and extensive on LE. Previously followed with derm for this concern, however does not wish to establish again currently. Rx'd betamethasone cream BID for the next few weeks.

## 2020-07-20 NOTE — Assessment & Plan Note (Signed)
Largely resolved, still suspect 2/2 biliary colic however she has managed with dietary changes. Will continue to recommend U/S especially if returns (not interested in an elective surgery at this time anyways) and she is aware of ED precautions including more sever abdominal pain with associated N/V/fatigue or fever.

## 2020-07-20 NOTE — Assessment & Plan Note (Signed)
[name] 6.4 today, well controlled. Continue Ozempic as is, tolerating well.

## 2020-07-20 NOTE — Assessment & Plan Note (Signed)
Well controlled. Continue norvasc 10mg  as is.

## 2020-07-23 ENCOUNTER — Other Ambulatory Visit: Payer: Self-pay

## 2020-07-23 DIAGNOSIS — E119 Type 2 diabetes mellitus without complications: Secondary | ICD-10-CM

## 2020-07-23 MED ORDER — OZEMPIC (0.25 OR 0.5 MG/DOSE) 2 MG/1.5ML ~~LOC~~ SOPN: 0.2500 mg | PEN_INJECTOR | SUBCUTANEOUS | 3 refills | Status: DC

## 2020-08-15 ENCOUNTER — Other Ambulatory Visit: Payer: Self-pay

## 2020-08-15 MED ORDER — AMLODIPINE BESYLATE 10 MG PO TABS: 10.0000 mg | ORAL_TABLET | Freq: Every day | ORAL | 3 refills | Status: DC

## 2020-08-30 ENCOUNTER — Other Ambulatory Visit: Payer: Self-pay

## 2020-08-30 NOTE — Patient Outreach (Signed)
[facility]) Care Management Chronic Special Needs Program  08/30/2020  Name: Rachael Garcia DOB: Jun 13, 1956  MRN: [id number]  [name] is enrolled in a chronic special needs plan for Diabetes. Telephone call to client for CSNP initial assessment. HIPAA verified. Client states she is independent in her care. She reports she does not check her blood sugars and does not have a monitor. RNCM discussed importance of monitoring blood sugar. Discussed hyper and hypoglycemia symptoms and treatment. Client states she is able to afford her medications and takes them as prescribed. Client states she follows up with her doctor regularly and has transportation to her appointments. Client reports on and off right side pain along with having a hiatal hernia and GERD. She reports she and her doctor discussed her pain concerns and she will follow up with her gastroenterologist.   Client reports she wants to lose at least 20 lbs by the end of January 2022. RNCM discussed and offered referral to [employer] health coach. Client declined.  Client provided RNCM contact information and 24 hour nurse advise line number.  Client agreed to next follow up outreach with [employer] case manager.   Goals Addressed              This Visit's Progress   .  "I want to lose at least 20lbs" (pt-stated)        Reviewed nutrition counseling benefit provided by [employer]. Contact your RN case manager for  referral to [employer] health coach if interested in service. RN case manager will send client education article:  Weight loss tips.       .  .        The goal for LDL is less than 70 mg/ dl as you are at high risk for complications try to avoid saturated fats, trans-fats, and eat more fiber RN case manager will send client education article on Heart healthy diet    .  Client understands the importance of follow-up with providers by attending scheduled  visits        Primary care provider visits. 07/18/20, 05/13/20 and 03/07/20 Contact your gastroenterologist office to schedule follow up appointment.     .  Client will verbalize knowledge of self management of Hypertension as evidences by BP reading of 140/90 or less; or as defined by provider        Take your blood pressure medication as prescribed.  Plan to eat low salt and heart healthy meals full of fruits, vegetables, whole grains, lean protein and limit fat and sugars Increase activity as tolerated. RN case manager will send client education article: High blood pressure in adults and Low salt diet    .  HEMOGLOBIN [name] < 7        Contact your [employer] concierge for coverage of blood sugar monitor.  Discussed diabetes self management actions:  Glucose monitoring per provider recommendation  Check feet daily  Visit provider every 3-6 months as directed  Hbg [name] level every 3-6 months.  Eye Exam yearly  Carbohydrate controlled meal planning  Taking diabetes medication as prescribed by provider  Physical activity  How to treat low blood sugars (Blood sugar less than 70 mg/dl  Please follow the RULE OF 15 for the treatment of hypoglycemia treatment (When your blood sugars are less than 70 mg/ dl) STEP  1:  Take 15 grams of carbohydrates when your blood sugar is low, which includes:  3-4 glucose tabs or  3-4 oz of juice or regular soda or  One tube of glucose gel STEP 2:  Recheck blood sugar in 15 minutes STEP 3:  If your blood sugar is still low at the 15 minute recheck ---then, go back to STEP 1 and treat again with another 15 grams of carbohydrates    .  Maintain timely refills of diabetic medication as prescribed within the year .        Client reports maintaining timely refills of her diabetic medication Contact your RN case manager if you have difficulty obtaining your medications    .  Obtain Annual Eye (retinal)  Exam         Client reports having eye exam  July 2021 Continue to have your eye exam annually    .  Obtain Annual Foot Exam        It is important that your doctor check you feet regularly.  Diabetes can affect the nerves in your feet causing decreased feeling or numbness.  Discuss this with your doctor at your next visit.  Diabetes foot care - Check feet daily at home (look for skin color changes, cuts, sores or cracks in the skin, swelling of feet or ankles, ingrown or fungal toenails, corn or calluses). Report these findings to your doctor - Wash feet with soap and water, dry feet well especially between toes - Moisturize your feet but not between the toes - Always wear shoes that protect your whole feet.      .  COMPLETED: Obtain annual screen for micro albuminuria (urine) , nephropathy (kidney problems)        Annual screen for micro albuminuria was 05/13/20    .  COMPLETED: Obtain Hemoglobin [name] at least 2 times per year        Hgb [name]  completed 07/18/20 and May 2021    .  Visit Primary Care Provider or Endocrinologist at least 2 times per year         Primary care provider visits. 07/18/20, 05/13/20 and 03/07/20       Plan:  Send successful outreach letter with a copy of their individualized care plan, Send individual care plan to provider and Send educational material  Chronic care management coordinator will outreach in: 12 months   Quinn Plowman RN,BSN,CCM [place] [facility] [phone]    .

## 2020-09-16 ENCOUNTER — Other Ambulatory Visit: Payer: Self-pay

## 2020-09-16 NOTE — Patient Outreach (Signed)
  [facility]) Care Management Chronic Special Needs Program    09/16/2020  Name: Rachael Garcia, DOB: Sep 29, 1956  MRN: [id number]   [name] is enrolled in a chronic special needs plan for Diabetes. Client is transitioning into another [employer] case management program as of 10/12/20.  Case manager will no longer be following goal after 10/11/20.    Quinn Plowman RN,BSN,CCM [facility] [phone]

## 2020-10-14 ENCOUNTER — Other Ambulatory Visit: Payer: Self-pay | Admitting: General Practice

## 2020-10-15 ENCOUNTER — Ambulatory Visit (INDEPENDENT_AMBULATORY_CARE_PROVIDER_SITE_OTHER): Payer: HMO | Admitting: Nurse Practitioner

## 2020-10-15 ENCOUNTER — Other Ambulatory Visit: Payer: Self-pay

## 2020-10-15 ENCOUNTER — Encounter: Payer: Self-pay | Admitting: Nurse Practitioner

## 2020-10-15 VITALS — BP 124/83 | Ht 65.0 in | Wt 200.6 lb

## 2020-10-15 DIAGNOSIS — Z01419 Encounter for gynecological examination (general) (routine) without abnormal findings: Secondary | ICD-10-CM

## 2020-10-15 DIAGNOSIS — Z780 Asymptomatic menopausal state: Secondary | ICD-10-CM

## 2020-10-15 NOTE — Progress Notes (Signed)
   Rachael Garcia 1956-06-13 [id number]   History:  65 y.o. [id number] presents for breast and pelvic exam.  2018 BSO with myomectomy due to ovarian cyst, on no HRT.  Normal Pap and mammogram history.  Gynecologic History No LMP recorded. Patient is postmenopausal.   Contraception: post menopausal status Last Pap: 11/23/2017. Results were: normal Last mammogram: 11/02/2017. Results were: normal Last colonoscopy: 2016. Results were: normal Last Dexa: 2010. Results were: normal  Past medical history, past surgical history, family history and social history were all reviewed and documented in the EPIC chart.  ROS:  A ROS was performed and pertinent positives and negatives are included.  Exam:  Vitals:   10/15/20 1523  BP: 124/83  Weight: 200 lb 9.6 oz (91 kg)  Height: 5\' 5"  (1.651 m)   Body mass index is 33.38 kg/m.  General appearance:  Normal Thyroid:  Symmetrical, normal in size, without palpable masses or nodularity. Respiratory  Auscultation:  Clear without wheezing or rhonchi Cardiovascular  Auscultation:  Regular rate, without rubs, murmurs or gallops  Edema/varicosities:  Not grossly evident Abdominal  Soft,nontender, without masses, guarding or rebound.  Liver/spleen:  No organomegaly noted  Hernia:  None appreciated  Skin  Inspection:  Grossly normal   Breasts: Examined lying and sitting.   Right: Without masses, retractions, discharge or axillary adenopathy.   Left: Without masses, retractions, discharge or axillary adenopathy. Gentitourinary   Inguinal/mons:  Normal without inguinal adenopathy  External genitalia:  Normal  BUS/Urethra/Skene's glands:  Normal  Vagina:  Normal  Cervix:  Normal  Uterus: Normal in size, shape and contour.  Midline and mobile  Adnexa/parametria:     Rt: Without masses or tenderness.   Lt: Without masses or tenderness.  Anus and perineum: Normal  Digital rectal exam: Normal sphincter tone without palpated masses or  tenderness  Assessment/Plan:  65 y.o. 75 for breast and pelvic exam.   Well female exam with routine gynecological exam -Education provided on SBEs, importance of preventative screenings, current guidelines, high calcium diet, regular exercise, and multivitamin daily. Labs with PCP.   Postmenopausal - Plan: DG Bone Density  Screening for cervical cancer - Normal Pap history.  Discussed current guidelines and options to stop screening. She requests a pap today and we will reassess on an annual basis.   Screening for breast cancer - Normal mammogram history.  She is overdue for mammogram and plans to schedule this soon.  Normal breast exam today.  Screening for colon cancer - 2016 colonoscopy. Will repeat at GI's recommended interval.   Follow up in 1 year for annual.     2017 [facility], 3:27 PM 10/15/2020

## 2020-10-15 NOTE — Patient Instructions (Signed)

## 2020-10-16 ENCOUNTER — Other Ambulatory Visit: Payer: Self-pay | Admitting: Nurse Practitioner

## 2020-10-16 DIAGNOSIS — Z1231 Encounter for screening mammogram for malignant neoplasm of breast: Secondary | ICD-10-CM

## 2020-10-16 LAB — PAP, TP IMAGING W/ HPV RNA, RFLX HPV TYPE 16,18/45: HPV DNA High Risk: NOT DETECTED

## 2020-11-18 ENCOUNTER — Ambulatory Visit: Payer: HMO

## 2020-11-25 ENCOUNTER — Ambulatory Visit: Payer: Self-pay

## 2020-11-28 ENCOUNTER — Telehealth: Payer: Self-pay

## 2020-11-28 NOTE — Telephone Encounter (Signed)
Patient LVM on  nurse line requesting freestyle libre CGM. Per chart review, it appears that patient is not injecting insulin 4x daily, therefore would not qualify for CGM, per insurance guidelines.   Attempted to contact patient to discuss. No answer, left VM requesting for patient to return call to office.   Talbot Grumbling, RN

## 2020-12-04 ENCOUNTER — Encounter: Payer: Self-pay | Admitting: Nurse Practitioner

## 2020-12-11 ENCOUNTER — Other Ambulatory Visit: Payer: Self-pay | Admitting: Nurse Practitioner

## 2020-12-11 DIAGNOSIS — E2839 Other primary ovarian failure: Secondary | ICD-10-CM

## 2020-12-12 ENCOUNTER — Other Ambulatory Visit: Payer: Self-pay

## 2020-12-12 MED ORDER — FLUTICASONE PROPIONATE 50 MCG/ACT NA SUSP: 2.0000 | Freq: Every day | NASAL | 11 refills | Status: DC

## 2020-12-16 ENCOUNTER — Ambulatory Visit
Admission: RE | Admit: 2020-12-16 | Discharge: 2020-12-16 | Disposition: A | Discharge status: Home / Self Care | Payer: HMO | Source: Ambulatory Visit | Attending: Nurse Practitioner | Admitting: Nurse Practitioner

## 2020-12-16 ENCOUNTER — Other Ambulatory Visit: Payer: Self-pay

## 2020-12-16 ENCOUNTER — Other Ambulatory Visit: Payer: Self-pay | Admitting: Family Medicine

## 2020-12-16 DIAGNOSIS — Z1231 Encounter for screening mammogram for malignant neoplasm of breast: Secondary | ICD-10-CM

## 2020-12-18 ENCOUNTER — Ambulatory Visit: Payer: HMO

## 2020-12-19 ENCOUNTER — Encounter: Payer: Self-pay | Admitting: Nurse Practitioner

## 2020-12-19 ENCOUNTER — Ambulatory Visit (INDEPENDENT_AMBULATORY_CARE_PROVIDER_SITE_OTHER): Payer: HMO | Admitting: Nurse Practitioner

## 2020-12-19 ENCOUNTER — Other Ambulatory Visit (INDEPENDENT_AMBULATORY_CARE_PROVIDER_SITE_OTHER): Payer: HMO

## 2020-12-19 VITALS — BP 134/72 | HR 71 | Ht 65.5 in | Wt 204.0 lb

## 2020-12-19 DIAGNOSIS — R1011 Right upper quadrant pain: Secondary | ICD-10-CM | POA: Diagnosis not present

## 2020-12-19 LAB — COMPREHENSIVE METABOLIC PANEL
ALT: 19 U/L (ref 0–35)
AST: 21 U/L (ref 0–37)
Albumin: 4 g/dL (ref 3.5–5.2)
Alkaline Phosphatase: 56 U/L (ref 39–117)
BUN: 11 mg/dL (ref 6–23)
CO2: 28 mEq/L (ref 19–32)
Calcium: 9.1 mg/dL (ref 8.4–10.5)
Chloride: 105 mEq/L (ref 96–112)
Creatinine, Ser: 0.84 mg/dL (ref 0.40–1.20)
GFR: 72.88 mL/min (ref >60.00)
Glucose, Bld: 97 mg/dL (ref 70–99)
Potassium: 3.9 mEq/L (ref 3.5–5.1)
Sodium: 140 mEq/L (ref 135–145)
Total Bilirubin: 0.5 mg/dL (ref 0.2–1.2)
Total Protein: 7 g/dL (ref 6.0–8.3)

## 2020-12-19 LAB — CBC WITH DIFFERENTIAL/PLATELET
Basophils Absolute: 0.1 10*3/uL (ref 0.0–0.1)
Basophils Relative: 1.2 % (ref 0.0–3.0)
Eosinophils Absolute: 0.2 10*3/uL (ref 0.0–0.7)
Eosinophils Relative: 2.9 % (ref 0.0–5.0)
HCT: 38.6 % (ref 36.0–46.0)
Hemoglobin: 12.9 g/dL (ref 12.0–15.0)
Lymphocytes Relative: 27.5 % (ref 12.0–46.0)
Lymphs Abs: 2.2 10*3/uL (ref 0.7–4.0)
MCHC: 33.5 g/dL (ref 30.0–36.0)
MCV: 84.8 fl (ref 78.0–100.0)
Monocytes Absolute: 0.7 10*3/uL (ref 0.1–1.0)
Monocytes Relative: 8.2 % (ref 3.0–12.0)
Neutro Abs: 4.8 10*3/uL (ref 1.4–7.7)
Neutrophils Relative %: 60.2 % (ref 43.0–77.0)
Platelets: 236 10*3/uL (ref 150.0–400.0)
RBC: 4.54 Mil/uL (ref 3.87–5.11)
RDW: 13.8 % (ref 11.5–15.5)
WBC: 8 10*3/uL (ref 4.0–10.5)

## 2020-12-19 MED ORDER — FAMOTIDINE 20 MG PO TABS: 20.0000 mg | ORAL_TABLET | Freq: Every day | ORAL | 1 refills | Status: DC

## 2020-12-19 NOTE — Progress Notes (Signed)
12/19/2020 MERRIAM BRANDNER [id number] 05-03-1956   CHIEF COMPLAINT: RUQ pain   HISTORY OF PRESENT ILLNESS: Katalaya Beel. Parveen is a 65 year old female with a past medical history of anxiety, hypertension, hyperlipidemia, anemia, sickle cell trait, diabetes mellitus type 2, kidney stones and GERD.  He presents to our office today self-referred for further evaluation regarding right upper quadrant abdominal pain which occurs most days for the past 3 to 4 months.  Her right upper quadrant abdominal pain radiates to the mid/lower thoracic area.  She has slight nausea if she overeats.  No vomiting.  No history of gallstones.  No dysphagia or heartburn.  She is passing normal formed brown bowel movement daily.  Her stools are sometimes greenish in color.  No rectal bleeding or melena.  She is concerned gas from the rectum is malodorous.  No diarrhea.  She has occasional constipation which she describes as straining a bit to pass a normal bowel movement which occurs about 3 days weekly.  She eats a fairly healthy diet.  She underwent a colonoscopy by Dr. Sharlett Iles 2005 which was normal.  Her most recent colonoscopy was completed 11/16/2014 by Dr. Maurene Capes and was normal.  No known family history of colorectal cancer she remains active.  No fever, sweats or chills.  No weight loss.  Her newest medication is Ozempic for diabetes which was started approximately 6 months ago.  She is enrolled in a clinical study at [facility] for diabetes which involves phlebotomy to correlate iron levels with HgA1C levels.  She rarely takes ibuprofen.  Colonoscopy 11/16/2014 by Dr. Olevia Perches: Normal colonoscopy Call colonoscopy 10 years  Colonoscopy 08/06/2004: Normal colonoscopy  EGD 08/06/2004: - Normal: Fundus to Duodenal 2nd Portion. Not Seen: Ulcer. Mucosal abnormality. Foreign body.  - Normal: Proximal Esophagus to Distal Esophagus. Tumor. Barrett's esophagus. Esophageal inflammation. Stricture. Varices.  - Dilation:  Proximal Esophagus. Maloney dilator used, Diameter: 56 F, No Resistance, No Heme present on extraction. 1  total dilators used. Patient tolerance excellent. Outcome: successful.   Past Medical History:  Diagnosis Date  . Allergy   . Anemia   . Anxiety   . Arthritis   . Blood dyscrasia    sickle cell trait  . Complication of anesthesia    o2 sat drop with epidural for  c-section surgery ;  subsequent surgeries no problems  . Diabetes ([place])    TYPE 2 ( 3 YEARS AGO)  . Diabetes mellitus without complication ([place])    [id number] had hga1c of 7 prescribed metformin; returned for repeat a1c that was a 5.? ; does not take any diabetes medication currently   . Fibroids    uterine  . GERD (gastroesophageal reflux disease)   . Heart murmur    "i've always had this"   . Hyperlipidemia   . Hypertension   . IBS (irritable bowel syndrome)   . Kidney stone   . Lichen planus   . Upper airway cough syndrome 06/01/2017   Max rx for gerd plus 1st gen h1  06/01/2017 >>>  - Allergy profile 06/01/2017 >  Eos 0.2 /  IgE  64 PAST pos cat grass tree    Past Surgical History:  Procedure Laterality Date  . CARPAL TUNNEL RELEASE Right 03  . CARPAL TUNNEL RELEASE  08/20/2011   Procedure: CARPAL TUNNEL RELEASE;  Surgeon: Cammie Sickle., MD;  Location: [facility];  Service: Orthopedics;  Laterality: Left;  . CESAREAN SECTION  83,92   x 2  .  COLONOSCOPY    . DIAGNOSTIC LAPAROSCOPY     ovarian cyst removal  . DILATION AND CURETTAGE OF UTERUS    . MASS EXCISION Right 07/03/2015   Procedure: EXCISION RIGHT LONG FINGER MASS;  Surgeon: Charlotte Crumb, MD;  Location: [facility];  Service: Orthopedics;  Laterality: Right;  . ROBOTIC ASSISTED BILATERAL SALPINGO OOPHERECTOMY Bilateral 06/29/2017   Procedure: XI ROBOTIC ASSISTED BILATERAL SALPINGO OOPHORECTOMY WITH PERITONEAL WASHINGS;  Surgeon: Everitt Amber, MD;  Location: [facility];  Service: Gynecology;  Laterality: Bilateral;  . TUBAL  LIGATION     Social History: She is widowed.  She has 2 daughters.  She is retired.  Smoked cigarettes in the past, quit smoking about 9 years ago.  She drinks 1 alcoholic beverage daily or less.  No history of drug use.  Family History: Mother age 55 arthritis, HTN, dementia. Father deceased age 81 with CHF, DM, kidney disease, mental health issues and alcoholism. Sister wth arthritis and CVA. Sister with DM II on insulin. Brother with DM on insulin and arthritis. Brother with HTN, CVA x 2, throat cancer and alcholism.    Allergies  Allergen Reactions  . Codeine     Hives   . Erythromycin     hives  . Sulfonamide Derivatives     Eyedrops cause swelling of the eyes      Outpatient Encounter Medications as of 12/19/2020  Medication Sig  . amLODipine (NORVASC) 10 MG tablet Take 1 tablet (10 mg total) by mouth daily.  . Ascorbic Acid (VITAMIN C GUMMIES PO) Take by mouth daily.  . Cholecalciferol (VITAMIN D PO) Take 5,000 Units by mouth.  . fluticasone (FLONASE) 50 MCG/ACT nasal spray Place 2 sprays into both nostrils daily. Client states she takes as needed.  . loratadine (CLARITIN) 10 MG tablet Take 10 mg by mouth daily as needed for allergies.  . Semaglutide,0.25 or 0.5MG /DOS, (OZEMPIC, 0.25 OR 0.5 MG/DOSE,) 2 MG/1.5ML SOPN Inject 0.25 mg into the skin once a week. 30 minutes before breakfast  . zinc gluconate 50 MG tablet Take 50 mg by mouth daily. Client states she takes 1/2 tablet.  . diclofenac Sodium (VOLTAREN) 1 % GEL Apply 4 g topically 4 (four) times daily. (Patient not taking: No sig reported)  . [DISCONTINUED] betamethasone dipropionate 0.05 % cream Apply topically 2 (two) times daily. (Patient not taking: Reported on 08/30/2020)  . [DISCONTINUED] Omega-3 Fatty Acids (FISH OIL PO) Take by mouth. (Patient not taking: No sig reported)   No facility-administered encounter medications on file as of 12/19/2020.    REVIEW OF SYSTEMS:  Gen: + Night sweats. Denies fever or chills.  No weight loss.  CV: Denies chest pain, palpitations or edema. Resp: Denies cough, shortness of breath of hemoptysis.  GI: See HPI. GU : Denies urinary burning, blood in urine, increased urinary frequency or incontinence. MS: + Back pain. No muscles aches or weakness. Derm: + Lichen Planus. Psych: + Headaches.  Heme: Denies bruising, bleeding. Neuro: + Headaches.  Endo:  Denies any problems with DM, thyroid or adrenal function.  PHYSICAL EXAM: Ht 5' 5.5" (1.664 m)   Wt 204 lb (92.5 kg)   BMI 33.43 kg/m  General: Well developed  64 year old female in no acute distress. Head: Normocephalic and atraumatic. Eyes:  Sclerae non-icteric, conjunctive pink. Ears: Normal auditory acuity. Mouth: Dentition intact. No ulcers or lesions.  Neck: Supple, no lymphadenopathy or thyromegaly.  Lungs: Clear bilaterally to auscultation without wheezes, crackles or rhonchi. Heart: Regular rate and  rhythm. No murmur, rub or gallop appreciated.  Abdomen: Soft, nontender, non distended. No masses. No hepatosplenomegaly. Normoactive bowel sounds x 4 quadrants.  No CVA tenderness. Rectal: Deferred. Musculoskeletal: Symmetrical with no gross deformities. Skin: Warm and dry. No rash or lesions on visible extremities. Extremities: No edema. Neurological: Alert oriented x 4, no focal deficits.  Psychological:  Alert and cooperative. Normal mood and affect.  ASSESSMENT AND PLAN:  31.  65 year old female with a history of GERD presents with  right upper quadrant pain which radiates to the mid thoracic spine area and occurs most days  x 3 to 4 months.  Mild nausea.  No vomiting.  No abdominal tenderness appreciated on exam today. -CBC, CMP -RUQ sonogram to evaluate the gallbladder -EGD benefits and risks discussed including risk with sedation, risk of bleeding, perforation and infection.  EGD to be scheduled after the above lab and sonogram results reviewed. -Patient will call our office if symptoms  worsen -Recommended famotidine 20 mg 1 p.o. daily  2.  Colon cancer screen.  Normal colonoscopy 11/2014.  -Next screening colonoscopy due 11/2024  3. DM II -If the above GI evaluation negative, may consider holding Ozempic    CC:  Patriciaann Clan, DO

## 2020-12-19 NOTE — Patient Instructions (Addendum)
If you are age 66 or older, your body mass index should be between 23-30. Your Body mass index is 33.43 kg/m. If this is out of the aforementioned range listed, please consider follow up with your Primary Care Provider.  LABS:  Lab work has been ordered for you today. Our lab is located in the basement. Press "B" on the elevator. The lab is located at the first door on the left as you exit the elevator.  MEDICATION We have sent the following medication to your pharmacy for you to pick up at your convenience: Famotidine 20 MG once a day.  IMAGING:  . You will be contacted by [facility] (Your caller ID will indicate phone # [phone]) in the next 2 days to schedule your Abdominal Ultrasound. If you have not heard from them within 2 business days, please call [facility] at [phone] to follow up on the status of your appointment.    Please call our office if your symptoms worsen. It was great seeing you today! Thank you for entrusting me with your care and choosing [facility].  Noralyn Pick, CRNP

## 2020-12-20 ENCOUNTER — Ambulatory Visit: Payer: Self-pay

## 2020-12-23 NOTE — Progress Notes (Signed)
Reviewed.  Rachael Buxton L. Rose Hippler, MD, MPH  

## 2020-12-30 ENCOUNTER — Ambulatory Visit (HOSPITAL_COMMUNITY)
Admission: RE | Admit: 2020-12-30 | Discharge: 2020-12-30 | Disposition: A | Discharge status: Home / Self Care | Payer: HMO | Source: Ambulatory Visit | Attending: Nurse Practitioner | Admitting: Nurse Practitioner

## 2020-12-30 ENCOUNTER — Other Ambulatory Visit: Payer: Self-pay

## 2020-12-30 DIAGNOSIS — R1011 Right upper quadrant pain: Secondary | ICD-10-CM | POA: Diagnosis not present

## 2020-12-30 DIAGNOSIS — R109 Unspecified abdominal pain: Secondary | ICD-10-CM | POA: Diagnosis not present

## 2021-01-03 ENCOUNTER — Other Ambulatory Visit: Payer: Self-pay

## 2021-01-03 MED ORDER — BD PEN NEEDLE NANO U/F 32G X 4 MM MISC: 0 refills | Status: DC

## 2021-01-03 NOTE — Telephone Encounter (Signed)
Patient calls nurse line requesting refills on pen needles for ozempic injections.   Sent per protocol.   Talbot Grumbling, RN

## 2021-01-29 ENCOUNTER — Other Ambulatory Visit: Payer: Self-pay

## 2021-01-29 ENCOUNTER — Ambulatory Visit (INDEPENDENT_AMBULATORY_CARE_PROVIDER_SITE_OTHER): Payer: HMO | Admitting: Family Medicine

## 2021-01-29 ENCOUNTER — Encounter: Payer: Self-pay | Admitting: Family Medicine

## 2021-01-29 VITALS — BP 138/72 | HR 75 | Ht 66.0 in | Wt 201.2 lb

## 2021-01-29 DIAGNOSIS — Z0000 Encounter for general adult medical examination without abnormal findings: Secondary | ICD-10-CM | POA: Insufficient documentation

## 2021-01-29 DIAGNOSIS — I10 Essential (primary) hypertension: Secondary | ICD-10-CM | POA: Diagnosis not present

## 2021-01-29 DIAGNOSIS — E785 Hyperlipidemia, unspecified: Secondary | ICD-10-CM | POA: Diagnosis not present

## 2021-01-29 DIAGNOSIS — R1011 Right upper quadrant pain: Secondary | ICD-10-CM | POA: Diagnosis not present

## 2021-01-29 DIAGNOSIS — E119 Type 2 diabetes mellitus without complications: Secondary | ICD-10-CM

## 2021-01-29 LAB — POCT GLYCOSYLATED HEMOGLOBIN (HGB A1C): HbA1c, POC (controlled diabetic range): 6.2 % (ref 0.0–7.0)

## 2021-01-29 NOTE — Assessment & Plan Note (Signed)
[name] 6.2% today, very well controlled.  Congratulated her on efforts of balancing diet and frequent physical activity as she can.  Continue Ozempic 0.25 mg weekly as is.  May continue in diabetic study through [facility], instructed to inform me when her MRI liver will be obtained as prescribed one time anti-anxiety medication.

## 2021-01-29 NOTE — Assessment & Plan Note (Addendum)
Resolved recently with meditation and balancing her diet.  RUQ U/S unremarkable.  Aware to follow-up with GI for EGD +/- H. Pylori testing if recurrent.

## 2021-01-29 NOTE — Patient Instructions (Signed)
It is wonderful to see you today.  I hope you have a wonderful time in [place].  Keep up the great work with your meditation and following a well-balanced diet.  Continue to try walking as you can.  We will check your cholesterol today, otherwise your labs from your recent visit looked great.  Follow-up in approximately 3 months or sooner if needed.

## 2021-01-29 NOTE — Progress Notes (Signed)
    SUBJECTIVE:   CHIEF COMPLAINT / HPI: Check-in DM  Rachael Garcia is a 65 year old female presenting discussed the following:  She reports that she overall is doing quite well.  Going to [place] for vacation next weekend.   T2DM: [name] 6.4% in 07/2020.  She is on Ozempic 0.25 mg weekly. In a diabetes study at [facility], undergoing a MRI of her liver as apart of it soon. She is very nervous about it.  She is walking several days a week for about 30 minutes if she can, sometimes limited by knee osteoarthritis.  Hypertension: Taking Norvasc 10 mg daily. Usually 323'F systolic at home.  Denies any lightheadedness/dizziness or headache.  No chest pain or shortness of breath.  RUQ abdominal pain: Chronic/recurrent, has not had any symptoms since late 12/2020.  Since then she has been working on meditation and balancing her diet with small frequent meals.  Saw GI for this concern in 12/2020.  RUQ ultrasound normal.  GI recommended proceeding with EGD if she has recurrent symptoms.  PERTINENT  PMH / PSH: Type 2 diabetes with neuropathy, right upper abdominal pain, hypertension, allergic rhinitis, anxiety  OBJECTIVE:   BP 138/72   Pulse 75   Ht 5\' 6"  (1.676 m)   Wt 201 lb 3.2 oz (91.3 kg)   SpO2 98%   BMI 32.47 kg/m   General: Alert, NAD HEENT: NCAT, MMM Cardiac: RRR  Lungs: Clear bilaterally, no increased WOB on RA Abdomen: soft, non-distended Msk: Moves all extremities spontaneously, normal gait without assistance  Ext: Warm, dry, 2+ distal pulses  ASSESSMENT/PLAN:   Type 2 diabetes mellitus without complication, without long-term current use of insulin (HCC) [name] 6.2% today, very well controlled.  Congratulated her on efforts of balancing diet and frequent physical activity as she can.  Continue Ozempic 0.25 mg weekly as is.  May continue in diabetic study through [facility], instructed to inform me when her MRI liver will be obtained as prescribed one time anti-anxiety  medication.  RUQ pain Resolved recently with meditation and balancing her diet.  RUQ U/S unremarkable.  Aware to follow-up with GI for EGD +/- H. Pylori testing if recurrent.  Essential hypertension Well-controlled.  Continue Norvasc as is.  Recent BMP in 12/2020 WNL.    Health care maintenance DEXA scheduled for 05/2021.  Declines pneumonia vaccine today, will consider in the future.  Has not been interested for the COVID-vaccine in the past due to significant AE in close proximity experienced by family (brother had stroke shortly afterwards).  Hyperlipidemia Obtain updated lipid panel.  Known elevated ASCVD risk, however patient has been hesitant to start statin therapy through several conversations previously.    Follow-up in 3 months for chronic conditions or sooner if needed.  Rachael Garcia, [place]

## 2021-01-29 NOTE — Assessment & Plan Note (Signed)
DEXA scheduled for 05/2021.  Declines pneumonia vaccine today, will consider in the future.  Has not been interested for the COVID-vaccine in the past due to significant AE in close proximity experienced by family (brother had stroke shortly afterwards).

## 2021-01-29 NOTE — Assessment & Plan Note (Signed)
Obtain updated lipid panel.  Known elevated ASCVD risk, however patient has been hesitant to start statin therapy through several conversations previously.

## 2021-01-29 NOTE — Assessment & Plan Note (Addendum)
Well-controlled.  Continue Norvasc as is.  Recent BMP in 12/2020 WNL.

## 2021-01-30 LAB — LIPID PANEL
Chol/HDL Ratio: 4 ratio (ref 0.0–4.4)
Cholesterol, Total: 237 mg/dL — ABNORMAL HIGH (ref 100–199)
HDL: 60 mg/dL (ref >39)
LDL Chol Calc (NIH): 160 mg/dL — ABNORMAL HIGH (ref 0–99)
Triglycerides: 96 mg/dL (ref 0–149)
VLDL Cholesterol Cal: 17 mg/dL (ref 5–40)

## 2021-02-27 ENCOUNTER — Other Ambulatory Visit: Payer: Self-pay

## 2021-02-27 DIAGNOSIS — E119 Type 2 diabetes mellitus without complications: Secondary | ICD-10-CM

## 2021-02-27 MED ORDER — OZEMPIC (0.25 OR 0.5 MG/DOSE) 2 MG/1.5ML ~~LOC~~ SOPN: 0.2500 mg | PEN_INJECTOR | SUBCUTANEOUS | 3 refills | Status: DC

## 2021-03-25 DIAGNOSIS — M79641 Pain in right hand: Secondary | ICD-10-CM | POA: Diagnosis not present

## 2021-03-25 DIAGNOSIS — M79642 Pain in left hand: Secondary | ICD-10-CM | POA: Diagnosis not present

## 2021-05-14 ENCOUNTER — Telehealth: Payer: Self-pay

## 2021-05-14 NOTE — Telephone Encounter (Signed)
Confirm medicare start date.   LVM to have pt call back to schedule AWV.   RE: confirm insurance and schedule AWV on my schedule if times are convenient for patient or other AWV schedule as template permits.

## 2021-05-21 ENCOUNTER — Other Ambulatory Visit: Payer: Self-pay

## 2021-05-21 ENCOUNTER — Ambulatory Visit
Admission: RE | Admit: 2021-05-21 | Discharge: 2021-05-21 | Disposition: A | Discharge status: Home / Self Care | Payer: HMO | Source: Ambulatory Visit | Attending: Nurse Practitioner | Admitting: Nurse Practitioner

## 2021-05-21 DIAGNOSIS — Z780 Asymptomatic menopausal state: Secondary | ICD-10-CM | POA: Diagnosis not present

## 2021-05-21 DIAGNOSIS — E2839 Other primary ovarian failure: Secondary | ICD-10-CM

## 2021-05-23 NOTE — Telephone Encounter (Signed)
Pt contacted to schedule AWV - pt asked if she was due for an a1c. Upon reviewing LOV with Dr. Higinio Plan, recommendation was for patient to follow up in 3 months. Pt stated that it would best for her to come in the office. Informed patient that I would send a note to get patient scheduled and AWV may scheduled at the same time. Feel free to schedule with me if needed.

## 2021-05-23 NOTE — Telephone Encounter (Signed)
Patient is scheduled for [name] check on 06/05/2021 at 11.

## 2021-06-05 ENCOUNTER — Encounter: Payer: Self-pay | Admitting: Student

## 2021-06-05 ENCOUNTER — Other Ambulatory Visit: Payer: Self-pay

## 2021-06-05 ENCOUNTER — Ambulatory Visit (INDEPENDENT_AMBULATORY_CARE_PROVIDER_SITE_OTHER): Payer: HMO | Admitting: Student

## 2021-06-05 VITALS — BP 149/71 | Ht 66.0 in | Wt 204.0 lb

## 2021-06-05 DIAGNOSIS — E119 Type 2 diabetes mellitus without complications: Secondary | ICD-10-CM

## 2021-06-05 LAB — POCT GLYCOSYLATED HEMOGLOBIN (HGB A1C): HbA1c, POC (controlled diabetic range): 6.4 % (ref 0.0–7.0)

## 2021-06-05 NOTE — Patient Instructions (Signed)
It was great to see you! Thank you for allowing me to participate in your care!  I recommend that you always bring your medications to each appointment as this makes it easy to ensure you are on the correct medications and helps [place] not miss when refills are needed.  Our plans for today:  -Your hemoglobin [name] today was 6.4.  This is great!  You are doing a great job controlling your blood sugars -Please make a follow-up annual wellness visit at your convenience -Please make a follow-up appointment to further discuss healthy lifestyle changes, a possible vestibular issue, and further discussion of starting a statin medication   Take care and seek immediate care sooner if you develop any concerns.   Dr. Precious Gilding, DO [facility]

## 2021-06-05 NOTE — Progress Notes (Signed)
    SUBJECTIVE:   CHIEF COMPLAINT / HPI: HA1C check, dizziness  PERTINENT  PMH / PSH: Type 2 diabetes, hypertension, obesity, hyperlipidemia  Dizziness Pt states she has been having a feeling of discomfort  in her head for the past week.  Says it is hard to describe. Almost a 'pull' or a 'throb' sensation but not painful like a headache. Not in a specific place but more frontal. Never felt like she was going to loose balance but felt 'off center'.  Only feeling like this slightly at the moment. Has felt like this every day for the past week, more when she first wakes up and sometimes will go away completely, but always returns. No chest pain currently, but did have slight pain in center of chest last week that was a 'quick stab' that lasted 5 seconds, did not reoccur. No headaches, no pre syncope, no SOB, no abdominal pain. No tinnitus currently but has had this in the past. Had vertigo several years ago.   HTN BP today is 149/71.  Checked it at home over a week ago and it was 130/74.   DM Ha1c is 6.4 today, was 6.2 four months ago     OBJECTIVE:   BP (!) 149/71   Ht '5\' 6"'$  (1.676 m)   Wt 204 lb (92.5 kg)   SpO2 98%   BMI 32.93 kg/m    General: NAD, pleasant, able to participate in exam Cardiac: RRR, no murmurs. Respiratory: Normal respiratory effort Neuro: alert, cranial nerves II through XII intact, sensation intact Psych: Normal affect and mood  ASSESSMENT/PLAN:   HTN BP elevated - pt states she is stressed this morning  -Continue amlodipine  DMT2 -Continue Ozempic  Dizziness Normal cranial nerve exam.  Symptoms sound vestibular in nature.  Patient states the symptoms are not debilitating.  She has had a history of vertigo many years ago. -Follow-up if symptoms do not go away or worsen  We briefly discussed healthy lifestyle changes including setting a goal of 15 to 20 minutes of physical activity 4 times a week.  She would like to further discuss this including  dietary changes as she would like to lose weight. -Follow-up appointment at her convenience to discuss healthy lifestyle changes  Dr. Precious Gilding, [place]

## 2021-06-18 ENCOUNTER — Ambulatory Visit: Payer: HMO

## 2021-06-24 ENCOUNTER — Ambulatory Visit (INDEPENDENT_AMBULATORY_CARE_PROVIDER_SITE_OTHER): Payer: HMO

## 2021-06-24 VITALS — Ht 65.0 in | Wt 199.0 lb

## 2021-06-24 DIAGNOSIS — Z0000 Encounter for general adult medical examination without abnormal findings: Secondary | ICD-10-CM | POA: Diagnosis not present

## 2021-06-24 NOTE — Progress Notes (Addendum)
Subjective:   Rachael Garcia is a 65 y.o. female who presents for Medicare Annual (Subsequent) preventive examination.  Patient consented to have virtual visit and was identified by name and date of birth. Method of visit: Telephone  Encounter participants: Patient: Rachael Garcia - located at Home Nurse/Provider: Dorna Bloom - located at [facility] Others (if applicable): NA  Review of Systems: Defer to PCP.  Cardiac Risk Factors include: obesity (BMI >30kg/m2);advanced age (>56mn, >>40women) Objective:   Vitals: Ht '5\' 5"'  (1.651 m)   Wt 199 lb (90.3 kg)   BMI 33.12 kg/m   Body mass index is 33.12 kg/m.  Advanced Directives 06/24/2021 06/05/2021 08/30/2020 07/18/2020 05/13/2020 01/10/2020 07/01/2017  Does Patient Have a Medical Advance Directive? No No No No No No No  Would patient like information on creating a medical advance directive? No - Patient declined No - Patient declined No - Patient declined No - Patient declined No - Patient declined No - Patient declined No - Patient declined   Tobacco Social History   Tobacco Use  Smoking Status Former   Years: 8.00   Types: Cigarettes   Quit date: 07/13/2011   Years since quitting: 9.9  Smokeless Tobacco Never     Counseling given: No plans to restart.  Clinical Intake:  Pre-visit preparation completed: Yes  Pain Score: 0-No pain  How often do you need to have someone help you when you read instructions, pamphlets, or other written materials from your doctor or pharmacy?: 2 - Rarely What is the last grade level you completed in school?: High School  Interpreter Needed?: No  Past Medical History:  Diagnosis Date   Allergy    Anemia    Anniversary reaction 12/14/2019   Anxiety    Arthritis    Blood dyscrasia    sickle cell trait   Complication of anesthesia    o2 sat drop with epidural for  c-section surgery ;  subsequent surgeries no problems   Diabetes ([facility]    TYPE 2 ( 3 YEARS AGO)   Diabetes mellitus without  complication ([facility]    [id number] hga1c of 7 prescribed metformin; returned for repeat a1c that was a 5.? ; does not take any diabetes medication currently    Fibroids    uterine   GERD (gastroesophageal reflux disease)    Heart murmur    "i've always had this"    Hyperlipidemia    Hypertension    IBS (irritable bowel syndrome)    Irritable bowel syndrome 08/13/2010   Qualifier: Diagnosis of  By: KNils PyleCMA (AAMA), Leisha     Kidney stone    Lichen planus    Shoulder pain, right 01/11/2020   Upper airway cough syndrome 06/01/2017   Max rx for gerd plus 1st gen h1  06/01/2017 >>>  - Allergy profile 06/01/2017 >  Eos 0.2 /  IgE  63 PAST pos cat grass tree    Past Surgical History:  Procedure Laterality Date   CARPAL TUNNEL RELEASE Right 03   CARPAL TUNNEL RELEASE  08/20/2011   Procedure: CARPAL TUNNEL RELEASE;  Surgeon: RCammie Sickle, MD;  [facility]  Service: Orthopedics;  Laterality: Left;   CESAREAN SECTION  83,92   x 2   COLONOSCOPY     DIAGNOSTIC LAPAROSCOPY     ovarian cyst removal   DILATION AND CURETTAGE OF UTERUS     MASS EXCISION Right 07/03/2015   Procedure: EXCISION RIGHT LONG FINGER MASS;  Surgeon: Charlotte Crumb, MD;  Location: [facility];  Service: Orthopedics;  Laterality: Right;   ROBOTIC ASSISTED BILATERAL SALPINGO OOPHERECTOMY Bilateral 06/29/2017   Procedure: XI ROBOTIC ASSISTED BILATERAL SALPINGO OOPHORECTOMY WITH PERITONEAL WASHINGS;  Surgeon: Everitt Amber, MD;  Location: [facility];  Service: Gynecology;  Laterality: Bilateral;   TUBAL LIGATION     Family History  Problem Relation Age of Onset   Arthritis Mother    Anemia Mother    Hypertension Mother    Diabetes Sister    Hypertension Father    Alcohol abuse Father    Heart disease Father 37       defibrillator and pacemaker   Hyperlipidemia Father    Multiple myeloma Maternal Grandmother    Stomach cancer Paternal Grandfather    Stroke Sister    Diabetes Brother     Cancer Brother        Throat   Colon cancer Neg Hx    Social History   Socioeconomic History   Marital status: Soil scientist    Spouse name: Not on file   Number of children: 2   Years of education: 12   Highest education level: 12th grade  Occupational History   Occupation: Soil scientist: [employer]   Occupation: Retired  Tobacco Use   Smoking status: Former    Years: 8.00    Types: Cigarettes    Quit date: 07/13/2011    Years since quitting: 9.9   Smokeless tobacco: Never  Vaping Use   Vaping Use: Never used  Substance and Sexual Activity   Alcohol use: Yes    Alcohol/week: 3.0 standard drinks    Types: 3 Glasses of wine per week    Comment: OCCASIONALLY WINE   Drug use: No   Sexual activity: Not Currently    Birth control/protection: Post-menopausal    Comment: 1st intercourse 65 yo-More than 5 partners  Other Topics Concern   Not on file  Social History Narrative   Patient lives in [place] with her partner.    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    Patient enjoys dancing and walking.    Patient has 2 cats- [place] and [place].    Social Determinants of Health   Financial Resource Strain: Low Risk    Difficulty of Paying Living Expenses: Not hard at all  Food Insecurity: No Food Insecurity   Worried About Charity fundraiser in the Last Year: Never true   [place] in the Last Year: Never true  Transportation Needs: No Transportation Needs   Lack of Transportation (Medical): No   Lack of Transportation (Non-Medical): No  Physical Activity: Inactive   Days of Exercise per Week: 0 days   Minutes of Exercise per Session: 0 min  Stress: No Stress Concern Present   Feeling of Stress : Only a little  Social Connections: Engineer, building services of Communication with Friends and Family: More than three times a week   Frequency of Social Gatherings with Friends and Family:  More than three times a week   Attends Religious Services: More than 4 times per year   Active Member of [employer] or Organizations: Yes   Attends Archivist Meetings: More than 4 times per year   Marital Status: Living with partner   Outpatient Encounter Medications as of 06/24/2021  Medication Sig   amLODipine (NORVASC) 10 MG tablet Take 1 tablet (10 mg total) by  mouth daily.   Ascorbic Acid (VITAMIN C GUMMIES PO) Take by mouth daily.   Cholecalciferol (VITAMIN D PO) Take 5,000 Units by mouth.   fluticasone (FLONASE) 50 MCG/ACT nasal spray Place 2 sprays into both nostrils daily. Client states she takes as needed.   Insulin Pen Needle (BD PEN NEEDLE NANO U/F) 32G X 4 MM MISC Please use to inject ozempic weekly.   Semaglutide,0.25 or 0.5MG/DOS, (OZEMPIC, 0.25 OR 0.5 MG/DOSE,) 2 MG/1.5ML SOPN Inject 0.25 mg into the skin once a week. 30 minutes before breakfast   zinc gluconate 50 MG tablet Take 50 mg by mouth daily. Client states she takes 1/2 tablet.   No facility-administered encounter medications on file as of 06/24/2021.   Activities of Daily Living In your present state of health, do you have any difficulty performing the following activities: 06/24/2021 08/30/2020  Hearing? 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 N Y  Comment - some difficulty seeing at night when driving  Difficulty concentrating or making decisions? N N  Walking or climbing stairs? N N  Dressing or bathing? N N  Doing errands, shopping? N N  Preparing Food and eating ? N N  Using the Toilet? N N  In the past six months, have you accidently leaked urine? - N  Do you have problems with loss of bowel control? - N  Managing your Medications? N N  Managing your Finances? N N  Housekeeping or managing your Housekeeping? N N  Some recent data might be hidden   Patient Care Team: Eppie Gibson, MD as PCP - General (Family Medicine)    Assessment:   This is a  routine wellness examination for Briannie.  Exercise Activities and Dietary recommendations Current Exercise Habits: The patient does not participate in regular exercise at present   Goals      Weight (lb) < 200 lb (90.7 kg)       Fall Risk Fall Risk  06/24/2021 06/05/2021 07/18/2020 05/13/2020 03/07/2020  Falls in the past year? 0 0 0 0 0  Number falls in past yr: 0 0 0 0 0  Injury with Fall? 0 0 0 0 -  Risk for fall due to : No Fall Risks - - - -  Follow up Falls prevention discussed - - - Falls evaluation completed   Patient reports appropriate gait and balance. Patient does not use assistive devices to ambulate.   Is the patient's home free of loose throw rugs in walkways, pet beds, electrical cords, etc?   yes      Grab bars in the bathroom? yes      Handrails on the stairs?   yes      Adequate lighting?   yes  Patient rating of health (0-10) scale: 7  Depression Screen PHQ 2/9 Scores 06/24/2021 06/05/2021 01/29/2021 08/30/2020  PHQ - 2 Score 0 0 0 1  PHQ- 9 Score - 2 0 -    Cognitive Function  6CIT Screen 06/24/2021  What Year? 0 points  What month? 0 points  What time? 0 points  Count back from 20 0 points  Months in reverse 0 points  Repeat phrase 0 points  Total Score 0   Immunization History  Administered Date(s) Administered   Td 10/12/2002   Tdap 07/01/2015   Qualifies for Shingles Vaccine? Yes   Zostavax completed No   Shingrix Completed?: No.    Education has been provided regarding the  importance of this vaccine. Patient has been advised to call insurance company to determine out of pocket expense if they have not yet received this vaccine. Advised may also receive vaccine at local pharmacy or Health Dept. Verbalized acceptance and understanding.  TDAP status: Due, Education has been provided regarding the importance of this vaccine. Advised may receive this vaccine at local pharmacy or Health Dept. Aware to provide a copy of the vaccination record if obtained  from local pharmacy or Health Dept. Verbalized acceptance and understanding.   Flu Vaccine status: Due, Education has been provided regarding the importance of this vaccine. Advised may receive this vaccine at local pharmacy or Health Dept. Aware to provide a copy of the vaccination record if obtained from local pharmacy or Health Dept. Verbalized acceptance and understanding.  Covid-19 vaccine status: Information provided on how to obtain vaccines.    Screening Tests Health Maintenance  Topic Date Due   COVID-19 Vaccine (1) Never done   Zoster Vaccines- Shingrix (1 of 2) Never done   PNA vac Low Risk Adult (1 of 2 - PCV13) Never done   OPHTHALMOLOGY EXAM  03/05/2021   FOOT EXAM  03/07/2021   INFLUENZA VACCINE  Never done   URINE MICROALBUMIN  05/13/2021   HEMOGLOBIN [name]  12/06/2021   MAMMOGRAM  12/17/2022   COLONOSCOPY (Pts 45-30yr Insurance coverage will need to be confirmed)  11/16/2024   TETANUS/TDAP  06/30/2025   DEXA SCAN  Completed   Hepatitis C Screening  Completed   HPV VACCINES  Aged Out   Vision Screening: Recommended annual ophthalmology exams for early detection of glaucoma and other disorders of the eye.  Cancer Screenings: Lung: Low Dose CT Chest recommended if Age 66-80years, 30 pack-year currently smoking OR have quit w/in 15years. Patient does not qualify. Breast:  Up to date on Mammogram? Yes   Up to date of Bone Density/Dexa? Yes Colorectal: UTD  Additional Screenings: Hepatitis C Screening: Completed  HIV Screening: Completed   Plan:  PCP apt scheduled for 9/23. Consider vaccines- Flu, Covid, and Shingles.  Discuss weight at PCP visit as we discussed.  Continue Ozempic in the meantime. I have personally reviewed and noted the following in the patient's chart:   Medical and social history Use of alcohol, tobacco or illicit drugs  Current medications and supplements Functional ability and status Nutritional status Physical activity Advanced  directives List of other physicians Hospitalizations, surgeries, and ER visits in previous 12 months Vitals Screenings to include cognitive, depression, and falls Referrals and appointments  In addition, I have reviewed and discussed with patient certain preventive protocols, quality metrics, and best practice recommendations. A written personalized care plan for preventive services as well as general preventive health recommendations were provided to patient.  This visit was conducted virtually in the setting of the [place]and[place].    EDorna Bloom C[place]ly 06/24/2021    I have reviewed this visit and agree with the documentation.  CDorris Singh MD  Family Medicine Teaching Service

## 2021-06-24 NOTE — Patient Instructions (Addendum)
You spoke to Rachael Garcia, [place] over the phone for your annual wellness visit.  We discussed goals:   Goals      Weight (lb) < 200 lb (90.7 kg)       We also discussed recommended health maintenance. As discussed, you are due for:  Health Maintenance  Topic Date Due   COVID-19 Vaccine (1) Never done   Zoster Vaccines- Shingrix (1 of 2) Never done   PNA vac Low Risk Adult (1 of 2 - PCV13) Never done   OPHTHALMOLOGY EXAM  03/05/2021   FOOT EXAM  03/07/2021   INFLUENZA VACCINE  Never done   URINE MICROALBUMIN  05/13/2021   HEMOGLOBIN [name]  12/06/2021   MAMMOGRAM  12/17/2022   COLONOSCOPY (Pts 45-86yr Insurance coverage will need to be confirmed)  11/16/2024   TETANUS/TDAP  06/30/2025   DEXA SCAN  Completed   Hepatitis C Screening  Completed   HPV VACCINES  Aged Out   PCP apt scheduled for 9/23. Consider vaccines- Flu, Covid, and Shingles.  Discuss weight at PCP visit as we discussed.  Continue Ozempic in the meantime.  Preventive Care 677Years and Older, Female Preventive care refers to lifestyle choices and visits with your health care provider that can promote health and wellness. This includes: A yearly physical exam. This is also called an annual wellness visit. Regular dental and eye exams. Immunizations. Screening for certain conditions. Healthy lifestyle choices, such as: Eating a healthy diet. Getting regular exercise. Not using drugs or products that contain nicotine and tobacco. Limiting alcohol use. What can I expect for my preventive care visit? Physical exam Your health care provider will check your: Height and weight. These may be used to calculate your BMI (body mass index). BMI is a measurement that tells if you are at a healthy weight. Heart rate and blood pressure. Body temperature. Skin for abnormal spots. Counseling Your health care provider may ask you questions about your: Past medical problems. Family's medical history. Alcohol, tobacco, and  drug use. Emotional well-being. Home life and relationship well-being. Sexual activity. Diet, exercise, and sleep habits. History of falls. Memory and ability to understand (cognition). Work and work eStatistician Pregnancy and menstrual history. Access to firearms. What immunizations do I need? Vaccines are usually given at various ages, according to a schedule. Your health care provider will recommend vaccines for you based on your age, medical history, and lifestyle or other factors, such as travel or where you work. What tests do I need? Blood tests Lipid and cholesterol levels. These may be checked every 5 years, or more often depending on your overall health. Hepatitis C test. Hepatitis B test. Screening Lung cancer screening. You may have this screening every year starting at age 6933if you have a 30-pack-year history of smoking and currently smoke or have quit within the past 15 years. Colorectal cancer screening. All adults should have this screening starting at age 694and continuing until age 66 Your health care provider may recommend screening at age 7060if you are at increased risk. You will have tests every 1-10 years, depending on your results and the type of screening test. Diabetes screening. This is done by checking your blood sugar (glucose) after you have not eaten for a while (fasting). You may have this done every 1-3 years. Mammogram. This may be done every 1-2 years. Talk with your health care provider about how often you should have regular mammograms. Abdominal aortic aneurysm (AAA) screening. You may need  this if you are a current or former smoker. BRCA-related cancer screening. This may be done if you have a family history of breast, ovarian, tubal, or peritoneal cancers. Other tests STD (sexually transmitted disease) testing, if you are at risk. Bone density scan. This is done to screen for osteoporosis. You may have this done starting at age 4. Talk with  your health care provider about your test results, treatment options, and if necessary, the need for more tests. Follow these instructions at home: Eating and drinking  Eat a diet that includes fresh fruits and vegetables, whole grains, lean protein, and low-fat dairy products. Limit your intake of foods with high amounts of sugar, saturated fats, and salt. Take vitamin and mineral supplements as recommended by your health care provider. Do not drink alcohol if your health care provider tells you not to drink. If you drink alcohol: Limit how much you have to 0-1 drink a day. Be aware of how much alcohol is in your drink. In the [place] one drink equals one 12 oz bottle of beer (355 mL), one 5 oz glass of wine (148 mL), or one 1 oz glass of hard liquor (44 mL). Lifestyle Take daily care of your teeth and gums. Brush your teeth every morning and night with fluoride toothpaste. Floss one time each day. Stay active. Exercise for at least 30 minutes 5 or more days each week. Do not use any products that contain nicotine or tobacco, such as cigarettes, e-cigarettes, and chewing tobacco. If you need help quitting, ask your health care provider. Do not use drugs. If you are sexually active, practice safe sex. Use a condom or other form of protection in order to prevent STIs (sexually transmitted infections). Talk with your health care provider about taking a low-dose aspirin or statin. Find healthy ways to cope with stress, such as: Meditation, yoga, or listening to music. Journaling. Talking to a trusted person. Spending time with friends and family. Safety Always wear your seat belt while driving or riding in a vehicle. Do not drive: If you have been drinking alcohol. Do not ride with someone who has been drinking. When you are tired or distracted. While texting. Wear a helmet and other protective equipment during sports activities. If you have firearms in your house, make sure you follow all  gun safety procedures. What's next? Visit your health care provider once a year for an annual wellness visit. Ask your health care provider how often you should have your eyes and teeth checked. Stay up to date on all vaccines. This information is not intended to replace advice given to you by your health care provider. Make sure you discuss any questions you have with your health care provider. Document Revised: 12/06/2020 Document Reviewed: 09/22/2018 Elsevier Patient Education  2022 [employer].    Our clinic's number is [phone]. Please call with questions or concerns about what we discussed today.

## 2021-06-30 NOTE — Progress Notes (Signed)
    SUBJECTIVE:   CHIEF COMPLAINT / HPI:   Lichen Planus: Patient has a longstanding diagnosis of lichen planus and has intermittently followed with dermatology and with our clinic for it.  She has extensive lesions of her lower extremities.  He was last seen by Dr. Higinio Plan who prescribed betamethasone which she says did not help at all she has had 1 lesion that is particularly concerning to her on her right leg.  It is raised and darker than the surrounding lesions.  She says that it has been this way for several months.  It is itchy but not more so than.  Currently she is only using castor oil on all of her lesions.  Dizziness: Patient was seen by Dr. Ronnald Ramp one month ago who felt symptoms were vestibular in nature.  Symptoms have since improved and she does not feel that she warrants further hysterectomy.  HTN: Had elevated BP at last visit to 149/71. Has been on Amlodipine '10mg'$  daily, which she is adherent with.  BP today is within normal limits.  HLD: Patient has diabetes, HTN, and HLD, she had previously spoken with Dr. Higinio Plan about adding a statin to her medication regimen.  We discussed her ASCVD risk today and she still prefers to defer statin therapy but will "think about it."   OBJECTIVE:   BP 119/75   Pulse 82   Wt 205 lb 3.2 oz (93.1 kg)   SpO2 98%   BMI 34.15 kg/m   Gen: NAD, alert, cooperative with exam HEENT: NCAT, EOMI, PERRL CV: RRR, good S1/S2, no murmur Resp: CTABL, no wheezes, non-labored Abd: Soft, Non Tender, Non Distended, BS present, no guarding or organomegaly Neuro: Alert and oriented, No gross deficits Skin: Extensive, round, hypopigmented lesions with hyperpigmented borders on lower extremities. Also with few similar lesions on BUE.  One irregular, hyperpigmented raised lesion on pretibial aspect of RLE. See photo below.     ASSESSMENT/PLAN:   Lichen planus Patient has failed betamethasone treatment, now just using castor oil as a homeopathic remedy.  Chronic symptoms are well-controlled. I am, however, somewhat concerned about the irregular lesion on her RLE. Will trial two weeks 0.5% Kenalog and if no improvement, will consider biopsy.  Hyperlipidemia ASCVD 10-year risk 21%.  Statin therapy would reduce her risk to 15.6%.  Discussed this with the patient and she would like to revisit the conversation time. -Follow-up at next visit  Essential hypertension Well-controlled.  Continue amlodipine 10 mg daily  Health care maintenance Discussed benefits of pneumococcal vaccine, patient defers vaccination today is open to further discussions at next visit - Follow-up at next visit     Pearla Dubonnet, [place]

## 2021-07-04 ENCOUNTER — Encounter: Payer: Self-pay | Admitting: Student

## 2021-07-04 ENCOUNTER — Ambulatory Visit (INDEPENDENT_AMBULATORY_CARE_PROVIDER_SITE_OTHER): Payer: HMO | Admitting: Student

## 2021-07-04 ENCOUNTER — Other Ambulatory Visit: Payer: Self-pay

## 2021-07-04 VITALS — BP 119/75 | HR 82 | Wt 205.2 lb

## 2021-07-04 DIAGNOSIS — L439 Lichen planus, unspecified: Secondary | ICD-10-CM

## 2021-07-04 DIAGNOSIS — Z0000 Encounter for general adult medical examination without abnormal findings: Secondary | ICD-10-CM | POA: Diagnosis not present

## 2021-07-04 DIAGNOSIS — E785 Hyperlipidemia, unspecified: Secondary | ICD-10-CM | POA: Diagnosis not present

## 2021-07-04 DIAGNOSIS — I10 Essential (primary) hypertension: Secondary | ICD-10-CM | POA: Diagnosis not present

## 2021-07-04 MED ORDER — TRIAMCINOLONE ACETONIDE 0.5 % EX OINT
1.0000 | TOPICAL_OINTMENT | Freq: Two times a day (BID) | CUTANEOUS | 0 refills | Status: DC
Start: 2021-07-04 — End: 2021-09-22

## 2021-07-04 NOTE — Patient Instructions (Addendum)
Ms. Chou, It is such a joy to take care you! Thank you for coming in today.   As a reminder, here is a recap of what we talked about today:  -I am a bit concerned about the dark raised spot on your leg.  I am sending a ointment to your pharmacy that you can apply twice daily for 2 weeks.  I would like to see you back after that to see how it is doing and possibly biopsy the lesion if it is not improved. -Your blood pressure today looks great, continue with the 10mg  daily -I am glad that your dizziness is doing better, we will not change anything at this time -I still believe that you would benefit from starting a statin for your cholesterol.  Please give this some thought and we will revisit the conversation at your next visit.  Statins save lives! -I recommend the pneumonia vaccine especially with your history of diabetes, we can continue discussing this at your next visit also  Take care and seek immediate care sooner if you develop any concerns.   Marnee Guarneri, MD [place]

## 2021-07-04 NOTE — Assessment & Plan Note (Signed)
Well-controlled.  Continue amlodipine 10 mg daily

## 2021-07-04 NOTE — Assessment & Plan Note (Signed)
Discussed benefits of pneumococcal vaccine, patient defers vaccination today is open to further discussions at next visit - Follow-up at next visit

## 2021-07-04 NOTE — Assessment & Plan Note (Addendum)
ASCVD 10-year risk 21%.  Statin therapy would reduce her risk to 15.6%.  Discussed this with the patient and she would like to revisit the conversation time. -Follow-up at next visit

## 2021-07-04 NOTE — Assessment & Plan Note (Signed)
Patient has failed betamethasone treatment, now just using castor oil as a homeopathic remedy. Chronic symptoms are well-controlled. I am, however, somewhat concerned about the irregular lesion on her RLE. Will trial two weeks 0.5% Kenalog and if no improvement, will consider biopsy.

## 2021-08-05 ENCOUNTER — Telehealth: Payer: Self-pay | Admitting: Student

## 2021-08-05 ENCOUNTER — Ambulatory Visit (INDEPENDENT_AMBULATORY_CARE_PROVIDER_SITE_OTHER): Payer: HMO | Admitting: Family Medicine

## 2021-08-05 ENCOUNTER — Encounter: Payer: Self-pay | Admitting: Family Medicine

## 2021-08-05 ENCOUNTER — Other Ambulatory Visit: Payer: Self-pay

## 2021-08-05 VITALS — BP 139/71 | HR 80 | Ht 65.0 in | Wt 205.6 lb

## 2021-08-05 DIAGNOSIS — M79671 Pain in right foot: Secondary | ICD-10-CM

## 2021-08-05 DIAGNOSIS — E1141 Type 2 diabetes mellitus with diabetic mononeuropathy: Secondary | ICD-10-CM

## 2021-08-05 NOTE — Telephone Encounter (Signed)
Patient requested handicap placard form in clinic this morning. Called patient to let her know that form is available for pickup at the front desk.

## 2021-08-05 NOTE — Assessment & Plan Note (Signed)
Presenting with chronic right toe numbness  No acute change from her baseline No foot ulceration. Continue tight DM control Referral to podiatry discussed and she agreed with this. Referral placed.

## 2021-08-05 NOTE — Patient Instructions (Signed)
Arthritis Arthritis means joint pain. It can also mean joint disease. A joint is a place where bones come together. There are more than 100 types of arthritis. What are the causes? This condition may be caused by: Wear and tear of a joint. This is the most common cause. A lot of acid in the blood, which leads to pain in the joint (gout). Pain and swelling (inflammation) in a joint. Infection of a joint. Injuries in the joint. A reaction to medicines (allergy). In some cases, the cause may not be known. What are the signs or symptoms? Symptoms of this condition include: Redness at a joint. Swelling at a joint. Stiffness at a joint. Warmth coming from the joint. A fever. A feeling of being sick. How is this treated? This condition may be treated with: Treating the cause, if it is known. Rest. Raising (elevating) the joint. Putting cold or hot packs on the joint. Medicines to treat symptoms and reduce pain and swelling. Shots of medicines (cortisone) into the joint. You may also be told to make changes in your life, such as doing exercises and losing weight. Follow these instructions at home: Medicines Take over-the-counter and prescription medicines only as told by your doctor. Do not take aspirin for pain if your doctor says that you may have gout. Activity Rest your joint if your doctor tells you to. Avoid activities that make the pain worse. Exercise your joint regularly as told by your doctor. Try doing exercises like: Swimming. Water aerobics. Biking. Walking. Managing pain, stiffness, and swelling   If told, put ice on the affected area. Put ice in a plastic bag. Place a towel between your skin and the bag. Leave the ice on for 20 minutes, 2-3 times per day. If your joint is swollen, raise (elevate) it above the level of your heart if told by your doctor. If your joint feels stiff in the morning, try taking a warm shower. If told, put heat on the affected area. Do  this as often as told by your doctor. Use the heat source that your doctor recommends, such as a moist heat pack or a heating pad. If you have diabetes, do not apply heat without asking your doctor. To apply heat: Place a towel between your skin and the heat source. Leave the heat on for 20-30 minutes. Remove the heat if your skin turns bright red. This is very important if you are unable to feel pain, heat, or cold. You may have a greater risk of getting burned. General instructions Do not use any products that contain nicotine or tobacco, such as cigarettes, e-cigarettes, and chewing tobacco. If you need help quitting, ask your doctor. Keep all follow-up visits as told by your doctor. This is important. Contact a doctor if: The pain gets worse. You have a fever. Get help right away if: You have very bad pain in your joint. You have swelling in your joint. Your joint is red. Many joints become painful and swollen. You have very bad back pain. Your leg is very weak. You cannot control your pee (urine) or poop (stool). Summary Arthritis means joint pain. It can also mean joint disease. A joint is a place where bones come together. The most common cause of this condition is wear and tear of a joint. Symptoms of this condition include redness, swelling, or stiffness of the joint. This condition is treated with rest, raising the joint, medicines, and putting cold or hot packs on the joint. Follow your  doctor's instructions about medicines, activity, exercises, and other home care treatments. This information is not intended to replace advice given to you by your health care provider. Make sure you discuss any questions you have with your health care provider. Document Revised: 09/05/2018 Document Reviewed: 09/05/2018 Elsevier Patient Education  2022 [employer].

## 2021-08-05 NOTE — Progress Notes (Signed)
    SUBJECTIVE:   CHIEF COMPLAINT / HPI:   HPI Foot pain:  C/O right 2nd toe and mid foot pain last week, which has not subsided after she used Vicks vapor rub on it. She denies swelling or redness and has no trauma to her foot. However, she was wearing tight-fitting shoes at the time.   Numbness: C/O right 1st toe numbness for years. She has not had a recent podiatry eval despite being diabetic.   PERTINENT  PMH / PSH: PMX reviewed.  OBJECTIVE:   BP 139/71   Pulse 80   Ht 5\' 5"  (1.651 m)   Wt 205 lb 9.6 oz (93.3 kg)   SpO2 98%   BMI 34.21 kg/m   Physical Exam Vitals and nursing note reviewed.  Cardiovascular:     Rate and Rhythm: Normal rate and regular rhythm.     Heart sounds: Normal heart sounds. No murmur heard. Pulmonary:     Effort: Pulmonary effort is normal. No respiratory distress.     Breath sounds: Normal breath sounds. No wheezing.  Musculoskeletal:     Right foot: Normal range of motion. No deformity, tenderness or bony tenderness.     Left foot: Normal.     Comments: Mild bony prominence on the dorsum of her right foot - non-tender, no swelling or erythema.     ASSESSMENT/PLAN:   Neuropathy in diabetes [facility]) Presenting with chronic right toe numbness  No acute change from her baseline No foot ulceration. Continue tight DM control Referral to podiatry discussed and she agreed with this. Referral placed.   Right foot pain - likely arthritis Symptoms resolved Monitor closely for now. F/U if there is a flare. She agreed with the plan.  Andrena Mews, MD [place]

## 2021-08-12 ENCOUNTER — Ambulatory Visit: Payer: HMO | Admitting: Podiatry

## 2021-09-02 ENCOUNTER — Ambulatory Visit: Payer: HMO | Admitting: Podiatry

## 2021-09-19 ENCOUNTER — Other Ambulatory Visit: Payer: Self-pay | Admitting: Family Medicine

## 2021-09-19 ENCOUNTER — Other Ambulatory Visit: Payer: Self-pay | Admitting: Student

## 2021-09-19 DIAGNOSIS — L439 Lichen planus, unspecified: Secondary | ICD-10-CM

## 2021-09-27 ENCOUNTER — Ambulatory Visit
Admission: EM | Admit: 2021-09-27 | Discharge: 2021-09-27 | Disposition: A | Discharge status: Home / Self Care | Payer: HMO | Attending: Internal Medicine | Admitting: Internal Medicine

## 2021-09-27 ENCOUNTER — Other Ambulatory Visit: Payer: Self-pay

## 2021-09-27 ENCOUNTER — Ambulatory Visit (INDEPENDENT_AMBULATORY_CARE_PROVIDER_SITE_OTHER): Payer: HMO

## 2021-09-27 ENCOUNTER — Encounter: Payer: Self-pay | Admitting: Emergency Medicine

## 2021-09-27 DIAGNOSIS — S92514A Nondisplaced fracture of proximal phalanx of right lesser toe(s), initial encounter for closed fracture: Secondary | ICD-10-CM | POA: Diagnosis not present

## 2021-09-27 DIAGNOSIS — S92501A Displaced unspecified fracture of right lesser toe(s), initial encounter for closed fracture: Secondary | ICD-10-CM | POA: Diagnosis not present

## 2021-09-27 NOTE — Discharge Instructions (Signed)
You have a fracture of your fifth toe.  Please follow-up with orthopedist for further evaluation and management.

## 2021-09-27 NOTE — ED Provider Notes (Signed)
[facility]    CSN: [id number] Arrival date & time: 09/27/21  1213      History   Chief Complaint Chief Complaint  Patient presents with   Foot Injury    HPI Rachael Garcia is a 65 y.o. female.   Patient presents with right fifth toe pain that started yesterday after injury.  Patient reports that she hit her toe on the corner of a wall and caused it to separate from the fourth toe.  Patient presents with pain to the dorsal surface of the foot as well as right fourth and fifth toes.  Patient reports that she is able to bear weight and pain mainly occurs when she applies shoes.  Denies any numbness or tingling.  Denies any lacerations or abrasions. Has full ROM of toes and feet. No ankle pain.    Foot Injury  Past Medical History:  Diagnosis Date   Allergy    Anemia    Anniversary reaction 12/14/2019   Anxiety    Arthritis    Blood dyscrasia    sickle cell trait   Complication of anesthesia    o2 sat drop with epidural for  c-section surgery ;  subsequent surgeries no problems   Diabetes ([place])    TYPE 2 ( 3 YEARS AGO)   Diabetes mellitus without complication ([place])    [phone] had hga1c of 7 prescribed metformin; returned for repeat a1c that was a 5.? ; does not take any diabetes medication currently    Fibroids    uterine   GERD (gastroesophageal reflux disease)    Heart murmur    "i've always had this"    Hyperlipidemia    Hypertension    IBS (irritable bowel syndrome)    Irritable bowel syndrome 08/13/2010   Qualifier: Diagnosis of  By: Nils Pyle CMA ([place]), [place]     Kidney stone    Lichen planus    Shoulder pain, right 01/11/2020   Upper airway cough syndrome 06/01/2017   Max rx for gerd plus 1st gen h1  06/01/2017 >>>  - Allergy profile 06/01/2017 >  Eos 0.2 /  IgE  64 PAST pos cat grass tree     Patient Active Problem List   Diagnosis Date Noted   Health care maintenance 30/16/0109   Lichen planus 32/35/5732   Anxiety 05/15/2020   Type 2 diabetes  mellitus without complication, without long-term current use of insulin ([place]) 12/14/2019   Left ovarian cyst 06/29/2017   Fibroids 06/29/2017   Neuropathy in diabetes ([place])    Lumbosacral disc disease    Hyperlipidemia 08/14/2010   Essential hypertension 08/14/2010   RUQ pain 08/14/2010   LACTOSE INTOLERANCE 08/13/2010   SICKLE CELL TRAIT 08/13/2010   GERD 08/13/2010   Allergic rhinitis 04/04/2007   ASTHMA 04/04/2007    Past Surgical History:  Procedure Laterality Date   CARPAL TUNNEL RELEASE Right 03   CARPAL TUNNEL RELEASE  08/20/2011   Procedure: CARPAL TUNNEL RELEASE;  Surgeon: Cammie Sickle., MD;  Location: [facility];  Service: Orthopedics;  Laterality: Left;   CESAREAN SECTION  83,92   x 2   COLONOSCOPY     DIAGNOSTIC LAPAROSCOPY     ovarian cyst removal   DILATION AND CURETTAGE OF UTERUS     MASS EXCISION Right 07/03/2015   Procedure: EXCISION RIGHT LONG FINGER MASS;  Surgeon: Charlotte Crumb, MD;  Location: [facility];  Service: Orthopedics;  Laterality: Right;   ROBOTIC ASSISTED BILATERAL SALPINGO OOPHERECTOMY  Bilateral 06/29/2017   Procedure: XI ROBOTIC ASSISTED BILATERAL SALPINGO OOPHORECTOMY WITH PERITONEAL WASHINGS;  Surgeon: Everitt Amber, MD;  Location: [facility];  Service: Gynecology;  Laterality: Bilateral;   TUBAL LIGATION      OB History     Gravida  4   Para  2   Term      Preterm      AB  2   Living  2      SAB  2   IAB      Ectopic      Multiple      Live Births               Home Medications    Prior to Admission medications   Medication Sig Start Date End Date Taking? Authorizing Provider  amLODipine (NORVASC) 10 MG tablet TAKE 1 TABLET BY MOUTH EVERY DAY 09/19/21   Eppie Gibson, MD  Ascorbic Acid (VITAMIN C GUMMIES PO) Take by mouth daily.    [provider]  Cholecalciferol (VITAMIN D PO) Take 5,000 Units by mouth.    [provider]  fluticasone (FLONASE) 50 MCG/ACT  nasal spray Place 2 sprays into both nostrils daily. Client states she takes as needed. Patient not taking: Reported on 08/05/2021 12/12/20   Patriciaann Clan, DO  Insulin Pen Needle (BD PEN NEEDLE NANO U/F) 32G X 4 MM MISC Please use to inject ozempic weekly. 01/03/21   McDiarmid, Blane Ohara, MD  Semaglutide,0.25 or 0.5MG/DOS, (OZEMPIC, 0.25 OR 0.5 MG/DOSE,) 2 MG/1.5ML SOPN Inject 0.25 mg into the skin once a week. 30 minutes before breakfast 02/27/21   Patriciaann Clan, DO  triamcinolone ointment (KENALOG) 0.5 % APPLY TO AFFECTED AREA TWICE A DAY 09/22/21   Eppie Gibson, MD  zinc gluconate 50 MG tablet Take 50 mg by mouth daily. Client states she takes 1/2 tablet.    [provider]    Family History Family History  Problem Relation Age of Onset   Arthritis Mother    Anemia Mother    Hypertension Mother    Diabetes Sister    Hypertension Father    Alcohol abuse Father    Heart disease Father 12       defibrillator and pacemaker   Hyperlipidemia Father    Multiple myeloma Maternal Grandmother    Stomach cancer Paternal Grandfather    Stroke Sister    Diabetes Brother    Cancer Brother        Throat   Colon cancer Neg Hx     Social History Social History   Tobacco Use   Smoking status: Former    Years: 8.00    Types: Cigarettes    Quit date: 07/13/2011    Years since quitting: 10.2   Smokeless tobacco: Never  Vaping Use   Vaping Use: Never used  Substance Use Topics   Alcohol use: Yes    Alcohol/week: 3.0 standard drinks    Types: 3 Glasses of wine per week    Comment: OCCASIONALLY WINE   Drug use: No     Allergies   Codeine, Erythromycin, and Sulfonamide derivatives   Review of Systems Review of Systems Per HPI  Physical Exam Triage Vital Signs ED Triage Vitals [09/27/21 1352]  Enc Vitals Group     BP (!) 149/83     Pulse Rate 66     Resp 16     Temp 98.1 F (36.7 C)     Temp Source Oral  SpO2 98 %     Weight      Height      Head  Circumference      Peak Flow      Pain Score 4     Pain Loc      Pain Edu?      Excl. in [place]?    No data found.  Updated Vital Signs BP (!) 149/83 (BP Location: Left Arm)    Pulse 66    Temp 98.1 F (36.7 C) (Oral)    Resp 16    SpO2 98%   Visual Acuity Right Eye Distance:   Left Eye Distance:   Bilateral Distance:    Right Eye Near:   Left Eye Near:    Bilateral Near:     Physical Exam Constitutional:      General: She is not in acute distress.    Appearance: Normal appearance. She is not toxic-appearing or diaphoretic.  HENT:     Head: Normocephalic and atraumatic.  Eyes:     Extraocular Movements: Extraocular movements intact.     Conjunctiva/sclera: Conjunctivae normal.  Pulmonary:     Effort: Pulmonary effort is normal.  Musculoskeletal:     Right foot: Normal range of motion and normal capillary refill. Swelling, tenderness and bony tenderness present. No deformity or crepitus. Normal pulse.     Left foot: Normal.     Comments: Patient has bruising, erythema, mild swelling noted to right fifth digit that extends slightly into right fourth digit and dorsal surface directly below right fifth digit.  Neurovascular intact.  Patient has full range of motion of toes, ankle, foot. Normal alignment. No abrasions or lacerations.   Neurological:     General: No focal deficit present.     Mental Status: She is alert and oriented to person, place, and time. Mental status is at baseline.  Psychiatric:        Mood and Affect: Mood normal.        Behavior: Behavior normal.        Thought Content: Thought content normal.        Judgment: Judgment normal.     UC Treatments / Results  Labs (all labs ordered are listed, but only abnormal results are displayed) Labs Reviewed - No data to display  EKG   Radiology DG Foot Complete Right  Result Date: 09/27/2021 CLINICAL DATA:  Patient stubbed fourth and fifth toes. EXAM: RIGHT FOOT COMPLETE - 3+ VIEW COMPARISON:  None.  FINDINGS: There is a nondisplaced oblique fracture involving the shaft of the fifth toe proximal phalanx. No additional acute fracture identified. No evidence of dislocation. There are scattered degenerative changes. Regional soft tissues are unremarkable. IMPRESSION: Nondisplaced oblique fracture involving the shaft of the fifth toe proximal phalanx. Electronically Signed   By: Audie Pinto M.D.   On: 09/27/2021 14:11    Procedures Procedures (including critical care time)  Medications Ordered in UC Medications - No data to display  Initial Impression / Assessment and Plan / UC Course  I have reviewed the triage vital signs and the nursing notes.  Pertinent labs & imaging results that were available during my care of the patient were reviewed by me and considered in my medical decision making (see chart for details).     Right foot x-ray showing right fifth phalanx fracture.  Patient offered buddy tape and/or postop shoe.  Patient preferred to have both.  These were applied in urgent care.  Patient will need to follow-up  with orthopedist for further evaluation and management.  Provided patient with contact formation.  Discussed supportive care and ice application.  No red flags on exam and patient is neurovascularly intact.  No abrasions noted or open wounds.  Patient verbalized understanding and was agreeable with plan. Final Clinical Impressions(s) / UC Diagnoses   Final diagnoses:  Closed fracture of phalanx of right fifth toe, initial encounter     Discharge Instructions      You have a fracture of your fifth toe.  Please follow-up with orthopedist for further evaluation and management.     ED Prescriptions   None    PDMP not reviewed this encounter.   Teodora Medici, [place] 09/27/21 1422

## 2021-09-27 NOTE — ED Triage Notes (Signed)
Stubbed right foot yesterday morning. Right 4th and 5th toe is where the majority of the pain/bruising/swelling is. Bruising/swelling extends to the middle foot, able to move all toes

## 2021-10-01 ENCOUNTER — Other Ambulatory Visit: Payer: Self-pay

## 2021-10-01 ENCOUNTER — Encounter: Payer: Self-pay | Admitting: Family

## 2021-10-01 ENCOUNTER — Ambulatory Visit (INDEPENDENT_AMBULATORY_CARE_PROVIDER_SITE_OTHER): Payer: HMO | Admitting: Family

## 2021-10-01 DIAGNOSIS — S92514A Nondisplaced fracture of proximal phalanx of right lesser toe(s), initial encounter for closed fracture: Secondary | ICD-10-CM

## 2021-10-01 NOTE — Progress Notes (Signed)
Office Visit Note   Patient: Rachael Garcia           Date of Birth: November 20, 1955           MRN: [id number] Visit Date: 10/01/2021              Requested by: Eppie Gibson, MD [place],  [place] [id number] PCP: Eppie Gibson, MD  No chief complaint on file.     HPI:  The patient is a 65 year old woman who presents today complaining of right fifth toe pain she is now 5 days out from her injury.  She stubbed her toe on the corner of the wall and injured her toe she has been having some bruising swelling pain with weightbearing.  She did present to urgent care on December 17.  Radiographs were revealing for a proximal phalanx fracture of the fifth toe she has been buddy taping and using a postop shoe  Assessment & Plan: Visit Diagnoses:  1. Closed nondisplaced fracture of proximal phalanx of lesser toe of right foot, initial encounter     Plan: will continue buddy taping and post op shoe for 2 more weeks. Can follow up if fails to improve as expected. Stiff shoe until comfortable in flexible shoe wear.  Follow-Up Instructions: Return in about 2 weeks (around 10/15/2021).   Ortho Exam  Patient is alert, oriented, no adenopathy, well-dressed, normal affect, normal respiratory effort. On examination of right foot mild edema and ecchymosis to 5th toe. No deformity. Palpable DP pulse. Freely moves all toes  Imaging: No results found. No images are attached to the encounter.  Labs: Lab Results  Component Value Date   HGBA1C 6.4 06/05/2021   HGBA1C 6.2 01/29/2021   HGBA1C 6.4 07/18/2020   ESRSEDRATE 9 03/28/2015   ESRSEDRATE 6 10/18/2014   REPTSTATUS 05/29/2017 FINAL 05/28/2017   CULT NO GROWTH 05/28/2017   LABORGA Normal Upper Respiratory Flora 05/28/2016   LABORGA No Beta Hemolytic Streptococci Isolated 05/28/2016     Lab Results  Component Value Date   ALBUMIN 4.0 12/19/2020   ALBUMIN 4.4 03/07/2020   ALBUMIN 4.0 06/23/2017    Lab Results  Component  Value Date   MG 2.2 07/26/2008   No results found for: VD25OH  No results found for: PREALBUMIN CBC EXTENDED Latest Ref Rng & Units 12/19/2020 06/29/2017 06/01/2017  WBC 4.0 - 10.5 K/uL 8.0 5.5 7.2  RBC 3.87 - 5.11 Mil/uL 4.54 4.58 4.72  HGB 12.0 - 15.0 g/dL 12.9 12.9 13.1  HCT 36.0 - 46.0 % 38.6 37.7 40.6  PLT 150.0 - 400.0 K/uL 236.0 222 242.0  NEUTROABS 1.4 - 7.7 K/uL 4.8 - 3.7  LYMPHSABS 0.7 - 4.0 K/uL 2.2 - 2.6     There is no height or weight on file to calculate BMI.  Orders:  No orders of the defined types were placed in this encounter.  No orders of the defined types were placed in this encounter.    Procedures: No procedures performed  Clinical Data: No additional findings.  ROS:  All other systems negative, except as noted in the HPI. Review of Systems  Objective: Vital Signs: There were no vitals taken for this visit.  Specialty Comments:  No specialty comments available.  PMFS History: Patient Active Problem List   Diagnosis Date Noted   Health care maintenance 68/34/1962   Lichen planus 22/97/9892   Anxiety 05/15/2020   Type 2 diabetes mellitus without complication, without long-term current use of insulin (  [place]) 12/14/2019   Left ovarian cyst 06/29/2017   Fibroids 06/29/2017   Neuropathy in diabetes ([place])    Lumbosacral disc disease    Hyperlipidemia 08/14/2010   Essential hypertension 08/14/2010   RUQ pain 08/14/2010   LACTOSE INTOLERANCE 08/13/2010   SICKLE CELL TRAIT 08/13/2010   GERD 08/13/2010   Allergic rhinitis 04/04/2007   ASTHMA 04/04/2007   Past Medical History:  Diagnosis Date   Allergy    Anemia    Anniversary reaction 12/14/2019   Anxiety    Arthritis    Blood dyscrasia    sickle cell trait   Complication of anesthesia    o2 sat drop with epidural for  c-section surgery ;  subsequent surgeries no problems   Diabetes ([place])    TYPE 2 ( 3 YEARS AGO)   Diabetes mellitus without complication ([place])    11-1973 had hga1c of 7  prescribed metformin; returned for repeat a1c that was a 5.? ; does not take any diabetes medication currently    Fibroids    uterine   GERD (gastroesophageal reflux disease)    Heart murmur    "i've always had this"    Hyperlipidemia    Hypertension    IBS (irritable bowel syndrome)    Irritable bowel syndrome 08/13/2010   Qualifier: Diagnosis of  By: Nils Pyle CMA ([facility]), Leisha     Kidney stone    Lichen planus    Shoulder pain, right 01/11/2020   Upper airway cough syndrome 06/01/2017   Max rx for gerd plus 1st gen h1  06/01/2017 >>>  - Allergy profile 06/01/2017 >  Eos 0.2 /  IgE  63 PAST pos cat grass tree     Family History  Problem Relation Age of Onset   Arthritis Mother    Anemia Mother    Hypertension Mother    Diabetes Sister    Hypertension Father    Alcohol abuse Father    Heart disease Father 70       defibrillator and pacemaker   Hyperlipidemia Father    Multiple myeloma Maternal Grandmother    Stomach cancer Paternal Grandfather    Stroke Sister    Diabetes Brother    Cancer Brother        Throat   Colon cancer Neg Hx     Past Surgical History:  Procedure Laterality Date   CARPAL TUNNEL RELEASE Right 03   CARPAL TUNNEL RELEASE  08/20/2011   Procedure: CARPAL TUNNEL RELEASE;  Surgeon: Cammie Sickle., MD;  Location: [facility];  Service: Orthopedics;  Laterality: Left;   CESAREAN SECTION  83,92   x 2   COLONOSCOPY     DIAGNOSTIC LAPAROSCOPY     ovarian cyst removal   DILATION AND CURETTAGE OF UTERUS     MASS EXCISION Right 07/03/2015   Procedure: EXCISION RIGHT LONG FINGER MASS;  Surgeon: Charlotte Crumb, MD;  Location: [facility];  Service: Orthopedics;  Laterality: Right;   ROBOTIC ASSISTED BILATERAL SALPINGO OOPHERECTOMY Bilateral 06/29/2017   Procedure: XI ROBOTIC ASSISTED BILATERAL SALPINGO OOPHORECTOMY WITH PERITONEAL WASHINGS;  Surgeon: Everitt Amber, MD;  Location[facility];  Service: Gynecology;  Laterality: Bilateral;    TUBAL LIGATION     Social History   Occupational History   Occupation: Soil scientist: [employer]   Occupation: Retired  Tobacco Use   Smoking status: Former    Years: 8.00    Types: Cigarettes    Quit  date: 07/13/2011    Years since quitting: 10.2   Smokeless tobacco: Never  Vaping Use   Vaping Use: Never used  Substance and Sexual Activity   Alcohol use: Yes    Alcohol/week: 3.0 standard drinks    Types: 3 Glasses of wine per week    Comment: OCCASIONALLY WINE   Drug use: No   Sexual activity: Not Currently    Birth control/protection: Post-menopausal    Comment: 1st intercourse 65 yo-More than 5 partners

## 2021-11-10 ENCOUNTER — Telehealth: Payer: Self-pay | Admitting: Student

## 2021-11-10 DIAGNOSIS — E119 Type 2 diabetes mellitus without complications: Secondary | ICD-10-CM

## 2021-11-10 MED ORDER — TRULICITY 0.75 MG/0.5ML ~~LOC~~ SOAJ: 0.7500 mg | SUBCUTANEOUS | 3 refills | Status: DC

## 2021-11-10 MED ORDER — OZEMPIC (0.25 OR 0.5 MG/DOSE) 2 MG/1.5ML ~~LOC~~ SOPN: 0.5000 mg | PEN_INJECTOR | SUBCUTANEOUS | 0 refills | Status: DC

## 2021-11-10 NOTE — Addendum Note (Signed)
Addended by: Jim Like B on: 11/10/2021 10:48 AM   Modules accepted: Orders

## 2021-11-10 NOTE — Telephone Encounter (Addendum)
Received a fax from patient's pharmacy that [facility] was on back order. Not clear when they will have it in stock. Considered giving patient samples from clinic, but unfortunately we do not have any in stock. Will transition to Trulicity for now and hope to transition back to [facility] when shortage has resolved.  Pearla Dubonnet, MD

## 2021-11-12 ENCOUNTER — Telehealth: Payer: Self-pay

## 2021-11-12 NOTE — Telephone Encounter (Signed)
Patient calls nurse line requesting to schedule a diabetes follow up appointment as well as discuss slight chest discomfort.   Patient reports that this discomfort is intermittent and does not radiate. No current chest pain or SHOB. States that this has been going on for over one week. Scheduled with PCP on Friday, 2/10. Provided patient with strict ED precautions.   Talbot Grumbling, RN

## 2021-11-18 IMAGING — US US ABDOMEN LIMITED RUQ/ASCITES
1 series · 14 of 25 positions shown · non-contrast
Comparison: Right upper quadrant ultrasound dated 08/14/2010.

CLINICAL DATA: 64-year-old female with right upper quadrant
abdominal pain.

EXAM:
ULTRASOUND ABDOMEN LIMITED RIGHT UPPER QUADRANT

[Series 1: us abdomen limited ruq/ascites · 14 of 45 slices shown]
[im 1/45]
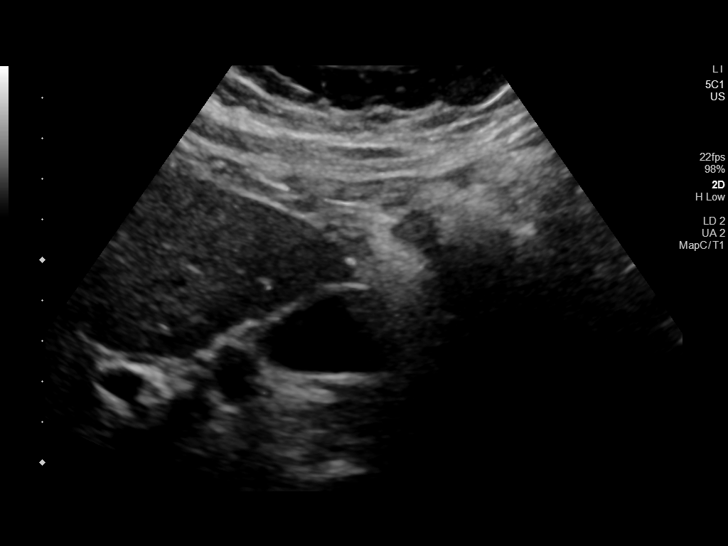
[im 4/45]
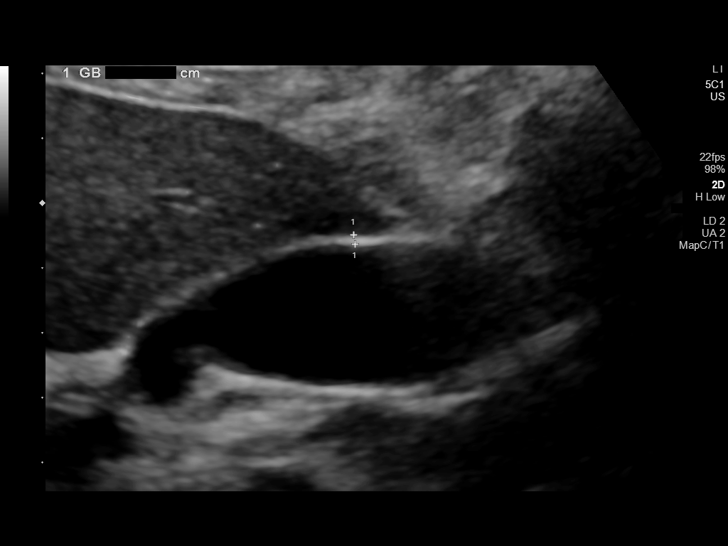
[im 8/45]
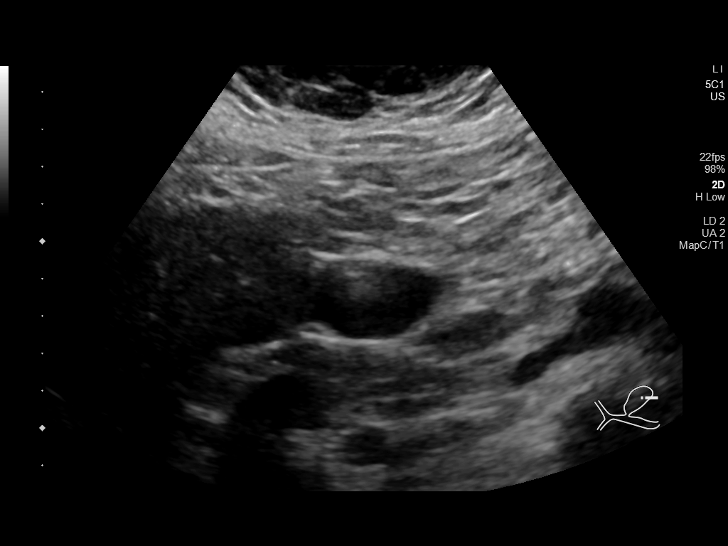
[im 12/45]
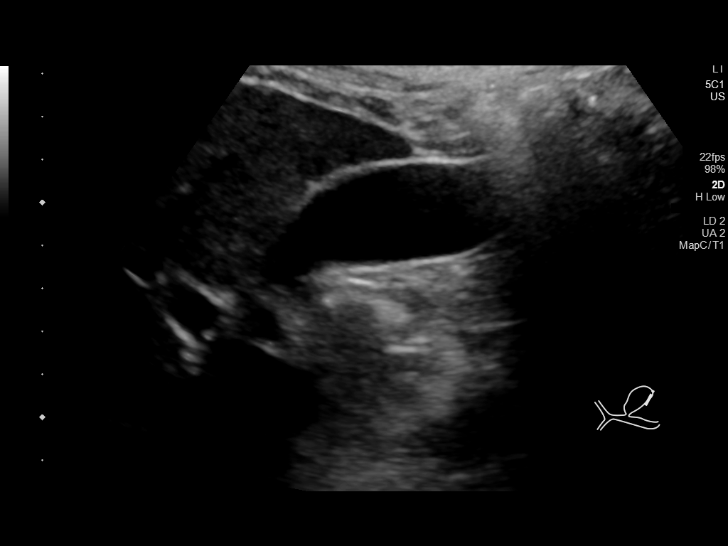
[im 15/45]
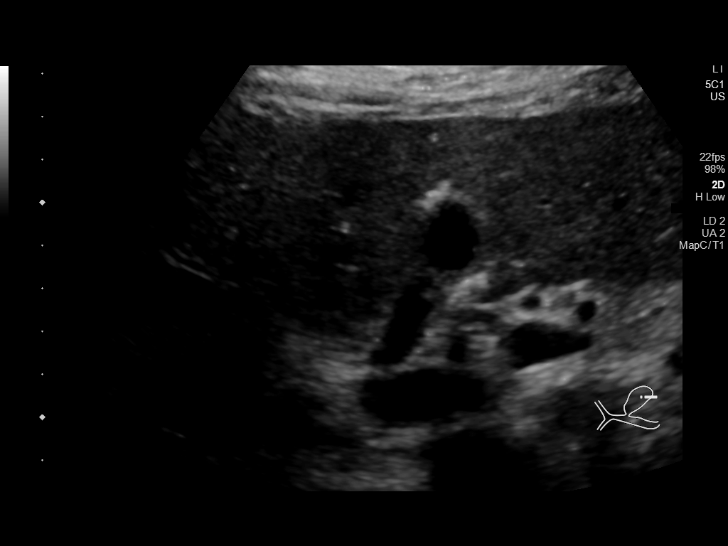
[im 17/45]
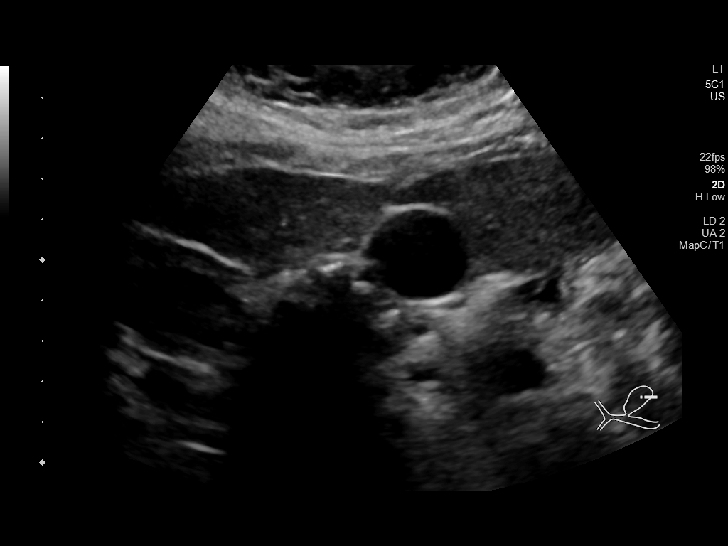
[im 21/45]
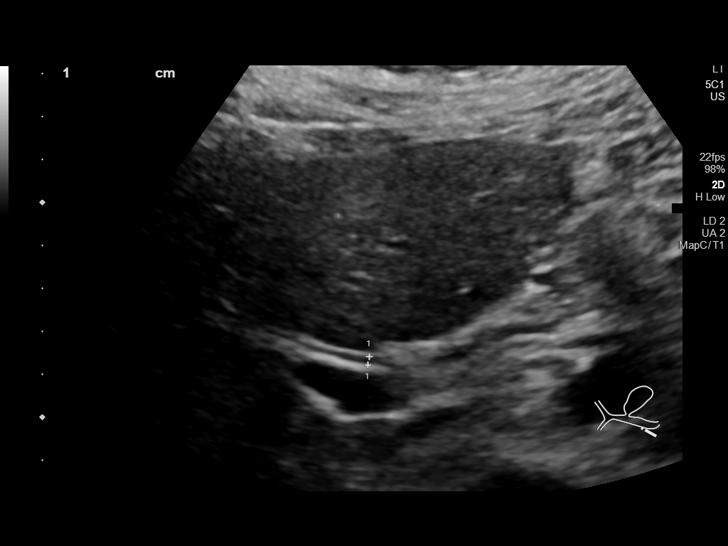
[im 24/45]
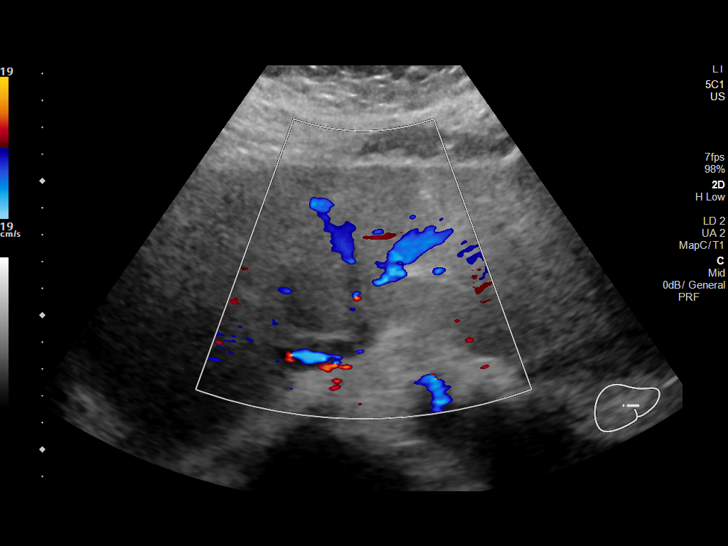
[im 28/45]
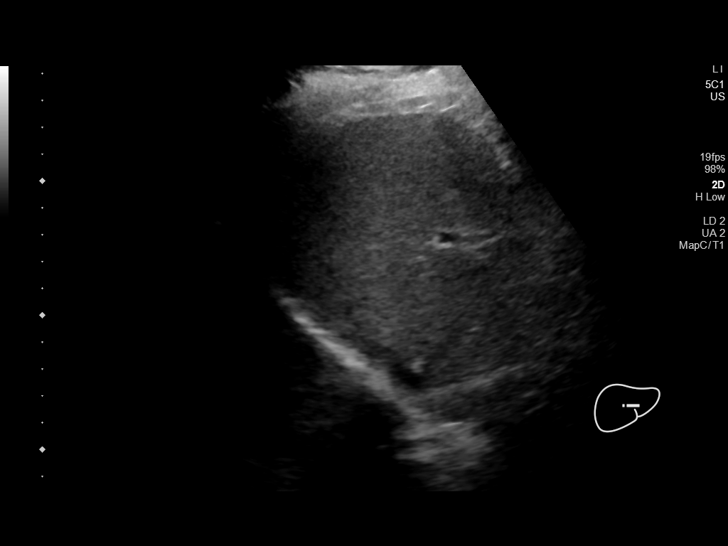
[im 30/45]
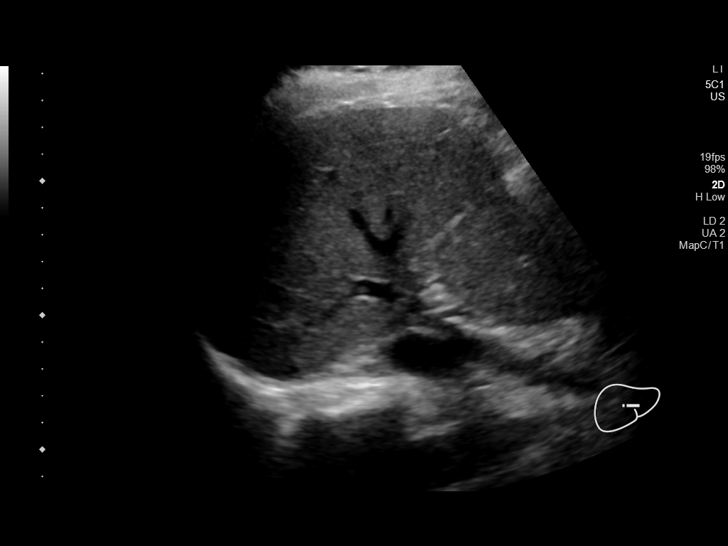
[im 34/45]
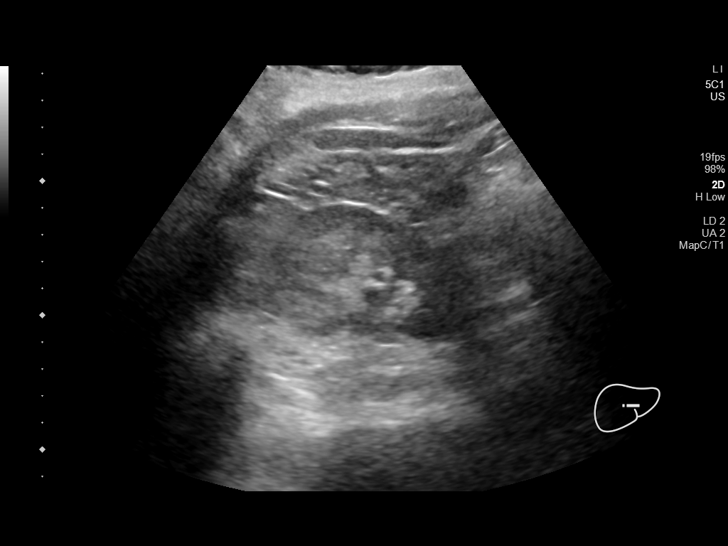
[im 37/45]
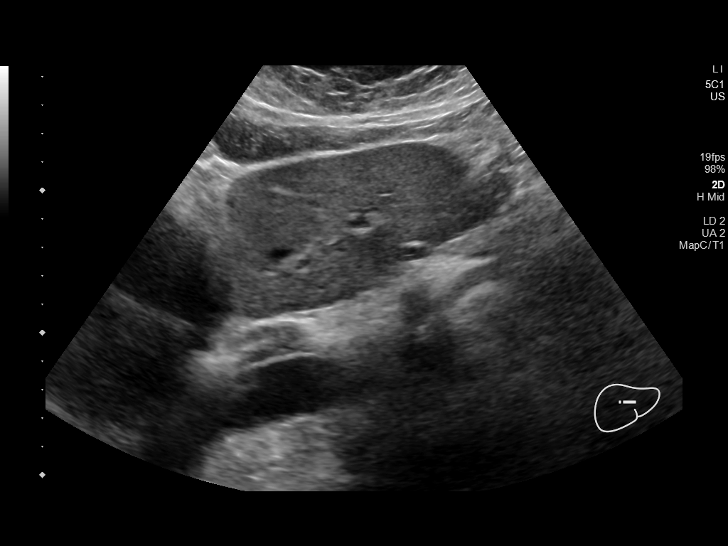
[im 41/45]
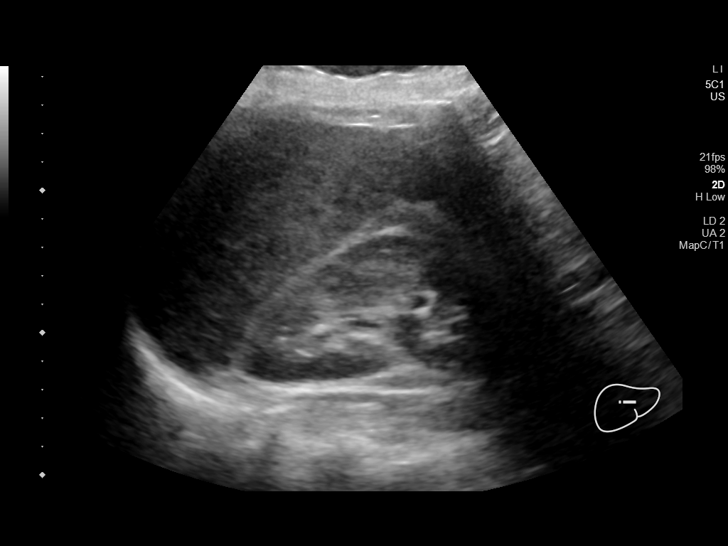
[im 45/45]
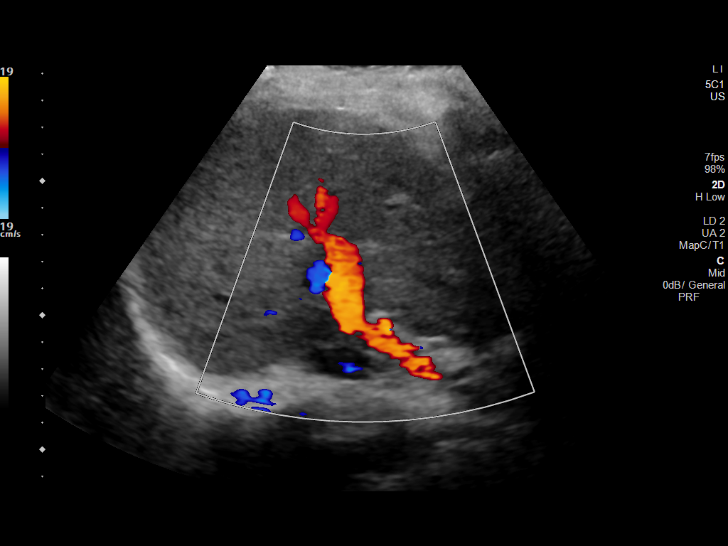

[14 of 25 positions shown; findings below may reference images not displayed]

FINDINGS: Gallbladder:

No gallstones or wall thickening visualized. No sonographic Murphy
sign noted by sonographer.

Common bile duct:

Diameter: 2 mm

Liver:

No focal lesion identified. Within normal limits in parenchymal
echogenicity. Portal vein is patent on color Doppler imaging with
normal direction of blood flow towards the liver.

Other: None.
IMPRESSION: Unremarkable right upper quadrant ultrasound.

## 2021-11-20 DIAGNOSIS — Z9189 Other specified personal risk factors, not elsewhere classified: Secondary | ICD-10-CM

## 2021-11-20 NOTE — Progress Notes (Signed)
[facility])                                            [place] Team                                        Statin Quality Measure Assessment    11/20/2021  SHANDIIN EISENBEIS 1955/10/30 [id number]  Per review of chart and payor information, this patient has been flagged for non-adherence to the following CMS Quality Measure:   [x]  Statin Use in Persons with Diabetes  []  Statin Use in Persons with Cardiovascular Disease  The 10-year ASCVD risk score (Arnett DK, et al., 2019) is: 33.1%   Values used to calculate the score:     Age: 67 years     Sex: Female     Is Non-Hispanic African American: Yes     Diabetic: Yes     Tobacco smoker: No     Systolic Blood Pressure: 384 mmHg     Is BP treated: Yes     HDL Cholesterol: 60 mg/dL     Total Cholesterol: 237 mg/dL     LDL 160 mg/dL (as of 01/29/2021)  Pt declined statin in 2022. Previously on atorvastatin. LDL in April 2022 was 160. ASCVD 33.1%. No antihyperlipidemic on file. Next appointment with PCP is 11/21/2021. If deemed appropriate, please consider reassessing need for statin therapy upon updated lipid panel. If patient had a prior statin intolerance, please associate exclusion code (see options below) at the next O/v.   Please consider ONE of the following recommendations:   Initiate high intensity statin Atorvastatin 40mg  once daily, #90, 3 refills   Rosuvastatin 20mg  once daily, #90, 3 refills    Initiate moderate intensity          statin with reduced frequency if prior          statin intolerance 1x weekly, #13, 3 refills   2x weekly, #26, 3 refills   3x weekly, #39, 3 refills   Code for past statin intolerance or other exclusions (required annually)  Drug Induced Myopathy G72.0   Myositis, unspecified M60.9   Rhabdomyolysis M62.82   Cirrhosis of liver K74.69   Biliary cirrhosis, unspecified K74.5   Abnormal blood glucose - for SUPD ONLY R73.09    Prediabetes - for SUPD ONLY  R73.03   Polycystic ovarian syndrome E28.2   Adverse effect of antihyperlipidemic and antiarteriosclerotic drugs, initial encounter Y65.9D3T   Thank you for your time,  Kristeen Miss, [place] Cell: [phone]

## 2021-11-21 ENCOUNTER — Ambulatory Visit: Payer: HMO | Admitting: Student

## 2021-12-02 NOTE — Progress Notes (Signed)
° ° ° °  SUBJECTIVE:   CHIEF COMPLAINT / HPI:   Diabetes Current Regimen: Trulicity 9.20 mg q weekly Last [name]: 6.4 on 06/05/21  Last Eye Exam: 03/05/20; needs to schedule Statin: n/a ACE/ARB: n/a  Chest pressure Occurred 1 week prior as to why she made the appointment. It is centralized. Has not happened again but this is the 2nd episode. The first event was 2 weeks ago. She felt dizzy and spatially disoriented at the time which lasted a few minutes. She denies chest pain, pressure, vision changes, shortness of breath, dizziness at this time. Unsure if correlates with movement or rest. Denies radiation into neck and arms.   PERTINENT  PMH / PSH: HTN  OBJECTIVE:   BP (!) 146/55    Pulse 63    Ht 5\' 5"  (1.651 m)    Wt 201 lb 6.4 oz (91.4 kg)    SpO2 96%    BMI 33.51 kg/m   EKG w/ multiple PVCs   Media Information Document Information  Photos    12/05/2021 14:15  Attached To:  Office Visit on 12/03/21 with Autry-Lott, Naaman Plummer, DO   Source Information  Autry-Lott, Naaman Plummer DO   [facility] Resident   Physical Exam Vitals reviewed.  Constitutional:      General: She is not in acute distress.    Appearance: She is not ill-appearing, toxic-appearing or diaphoretic.  Cardiovascular:     Rate and Rhythm: Normal rate. Rhythm irregular.     Heart sounds: Normal heart sounds.  Pulmonary:     Effort: Pulmonary effort is normal.     Breath sounds: Normal breath sounds.  Neurological:     Mental Status: She is alert and oriented to person, place, and time.  Psychiatric:        Mood and Affect: Mood normal.        Behavior: Behavior normal.   ASSESSMENT/PLAN:   1. Type 2 diabetes mellitus without complication, without long-term current use of insulin (HCC) Well controlled. Continue current regimen. Follow up in 6 months.  - HgB [name]  2. Other cardiac arrhythmia Several weeks of intermittent non sustained centralized non radiating chest pressure. 2 events 1 week apart thus far.  Upon auscultation appreciated an abnormal rhythm. Patient then stated she does feel as though her heart beats abnormally sometimes. EKG remarkable for PVCs. Will obtain risk stratification labs and refer to cardiology. Strict ED precautions given.  - EKG 12-Lead - Anemia panel - Comprehensive metabolic panel - Lipid Panel - TSH - Ambulatory referral to Cardiology  Gerlene Fee, [place]

## 2021-12-02 NOTE — Patient Instructions (Addendum)
It was wonderful to see you today.  Please bring ALL of your medications with you to every visit.   Today we talked about:  Diabetes your [name] is 6.5 good job continue Trulicity. On EKG today it showed some premature ventricular contractions which are extra heartbeats and as discussed we will get labs and refer you to cardiology  Please be sure to schedule follow up at the front  desk before you leave today.   If you haven't already, sign up for My Chart to have easy access to your labs results, and communication with your primary care physician.  Please call the clinic at [phone] if your symptoms worsen or you have any concerns. It was our pleasure to serve you.  Dr. Janus Molder

## 2021-12-03 ENCOUNTER — Ambulatory Visit (INDEPENDENT_AMBULATORY_CARE_PROVIDER_SITE_OTHER): Payer: HMO | Admitting: Family Medicine

## 2021-12-03 ENCOUNTER — Encounter: Payer: Self-pay | Admitting: Family Medicine

## 2021-12-03 ENCOUNTER — Other Ambulatory Visit: Payer: Self-pay

## 2021-12-03 VITALS — BP 146/55 | HR 63 | Ht 65.0 in | Wt 201.4 lb

## 2021-12-03 DIAGNOSIS — I498 Other specified cardiac arrhythmias: Secondary | ICD-10-CM | POA: Diagnosis not present

## 2021-12-03 DIAGNOSIS — E119 Type 2 diabetes mellitus without complications: Secondary | ICD-10-CM | POA: Diagnosis not present

## 2021-12-03 LAB — POCT GLYCOSYLATED HEMOGLOBIN (HGB A1C): HbA1c, POC (controlled diabetic range): 6.5 % (ref 0.0–7.0)

## 2021-12-04 LAB — COMPREHENSIVE METABOLIC PANEL
ALT: 20 IU/L (ref 0–32)
AST: 24 IU/L (ref 0–40)
Albumin/Globulin Ratio: 1.6 (ref 1.2–2.2)
Albumin: 4.3 g/dL (ref 3.8–4.8)
Alkaline Phosphatase: 62 IU/L (ref 44–121)
BUN/Creatinine Ratio: 17 (ref 12–28)
BUN: 14 mg/dL (ref 8–27)
Bilirubin Total: 0.4 mg/dL (ref 0.0–1.2)
CO2: 22 mmol/L (ref 20–29)
Calcium: 9.5 mg/dL (ref 8.7–10.3)
Chloride: 105 mmol/L (ref 96–106)
Creatinine, Ser: 0.84 mg/dL (ref 0.57–1.00)
Globulin, Total: 2.7 g/dL (ref 1.5–4.5)
Glucose: 82 mg/dL (ref 70–99)
Potassium: 4.2 mmol/L (ref 3.5–5.2)
Sodium: 144 mmol/L (ref 134–144)
Total Protein: 7 g/dL (ref 6.0–8.5)
eGFR: 77 mL/min/{1.73_m2} (ref >59)

## 2021-12-04 LAB — ANEMIA PANEL
Ferritin: 152 ng/mL — ABNORMAL HIGH (ref 15–150)
Folate, Hemolysate: 429 ng/mL
Folate, RBC: 1059 ng/mL (ref >498)
Hematocrit: 40.5 % (ref 34.0–46.6)
Iron Saturation: 21 % (ref 15–55)
Iron: 66 ug/dL (ref 27–139)
Retic Ct Pct: 1.3 % (ref 0.6–2.6)
Total Iron Binding Capacity: 316 ug/dL (ref 250–450)
UIBC: 250 ug/dL (ref 118–369)
Vitamin B-12: 799 pg/mL (ref 232–1245)

## 2021-12-04 LAB — LIPID PANEL
Chol/HDL Ratio: 3.7 ratio (ref 0.0–4.4)
Cholesterol, Total: 257 mg/dL — ABNORMAL HIGH (ref 100–199)
HDL: 69 mg/dL (ref >39)
LDL Chol Calc (NIH): 166 mg/dL — ABNORMAL HIGH (ref 0–99)
Triglycerides: 125 mg/dL (ref 0–149)
VLDL Cholesterol Cal: 22 mg/dL (ref 5–40)

## 2021-12-04 LAB — TSH: TSH: 2.6 u[IU]/mL (ref 0.450–4.500)

## 2021-12-09 ENCOUNTER — Other Ambulatory Visit: Payer: Self-pay | Admitting: Student

## 2021-12-09 DIAGNOSIS — E119 Type 2 diabetes mellitus without complications: Secondary | ICD-10-CM

## 2021-12-10 ENCOUNTER — Telehealth: Payer: Self-pay | Admitting: Family Medicine

## 2021-12-10 DIAGNOSIS — E785 Hyperlipidemia, unspecified: Secondary | ICD-10-CM

## 2021-12-10 MED ORDER — ATORVASTATIN CALCIUM 40 MG PO TABS: 40.0000 mg | ORAL_TABLET | Freq: Every day | ORAL | 3 refills | Status: DC

## 2021-12-10 NOTE — Telephone Encounter (Signed)
ASCVD 10 yr risk 33%. Discussed findings with patient and what it would mean to optimize this number. Will start statin medication.  ? ?Gerlene Fee, DO ?12/10/2021, 2:42 PM ?PGY-3, [place] ? ?

## 2021-12-19 ENCOUNTER — Emergency Department (HOSPITAL_BASED_OUTPATIENT_CLINIC_OR_DEPARTMENT_OTHER): Payer: HMO

## 2021-12-19 ENCOUNTER — Other Ambulatory Visit: Payer: Self-pay

## 2021-12-19 ENCOUNTER — Encounter (HOSPITAL_BASED_OUTPATIENT_CLINIC_OR_DEPARTMENT_OTHER): Payer: Self-pay

## 2021-12-19 ENCOUNTER — Emergency Department (HOSPITAL_BASED_OUTPATIENT_CLINIC_OR_DEPARTMENT_OTHER)
Admission: EM | Admit: 2021-12-19 | Discharge: 2021-12-19 | Disposition: A | Discharge status: Home / Self Care | Payer: HMO | Attending: Emergency Medicine | Admitting: Emergency Medicine

## 2021-12-19 DIAGNOSIS — Z041 Encounter for examination and observation following transport accident: Secondary | ICD-10-CM | POA: Diagnosis not present

## 2021-12-19 DIAGNOSIS — S6991XA Unspecified injury of right wrist, hand and finger(s), initial encounter: Secondary | ICD-10-CM | POA: Insufficient documentation

## 2021-12-19 DIAGNOSIS — L03011 Cellulitis of right finger: Secondary | ICD-10-CM | POA: Diagnosis not present

## 2021-12-19 DIAGNOSIS — Y92410 Unspecified street and highway as the place of occurrence of the external cause: Secondary | ICD-10-CM | POA: Diagnosis not present

## 2021-12-19 DIAGNOSIS — S199XXA Unspecified injury of neck, initial encounter: Secondary | ICD-10-CM | POA: Diagnosis not present

## 2021-12-19 DIAGNOSIS — S299XXA Unspecified injury of thorax, initial encounter: Secondary | ICD-10-CM | POA: Insufficient documentation

## 2021-12-19 DIAGNOSIS — M79641 Pain in right hand: Secondary | ICD-10-CM | POA: Diagnosis not present

## 2021-12-19 DIAGNOSIS — M546 Pain in thoracic spine: Secondary | ICD-10-CM | POA: Diagnosis not present

## 2021-12-19 MED ORDER — LIDOCAINE 5 % EX PTCH
1.0000 | MEDICATED_PATCH | CUTANEOUS | Status: DC
  Administered 2021-12-19: 1 via TRANSDERMAL
  Filled 2021-12-19: qty 1

## 2021-12-19 MED ORDER — LIDOCAINE-EPINEPHRINE-TETRACAINE (LET) TOPICAL GEL
3.0000 mL | Freq: Once | TOPICAL | Status: AC
  Administered 2021-12-19: 3 mL via TOPICAL
  Filled 2021-12-19: qty 3

## 2021-12-19 MED ORDER — CEPHALEXIN 500 MG PO CAPS: 500.0000 mg | ORAL_CAPSULE | Freq: Four times a day (QID) | ORAL | 0 refills | Status: DC

## 2021-12-19 MED ORDER — LIDOCAINE 5 % EX PTCH: 1.0000 | MEDICATED_PATCH | CUTANEOUS | 0 refills | Status: DC
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
## 2021-12-23 ENCOUNTER — Encounter: Payer: Self-pay | Admitting: Internal Medicine

## 2021-12-23 ENCOUNTER — Other Ambulatory Visit: Payer: Self-pay

## 2021-12-23 ENCOUNTER — Ambulatory Visit (INDEPENDENT_AMBULATORY_CARE_PROVIDER_SITE_OTHER): Payer: HMO | Admitting: Internal Medicine

## 2021-12-23 ENCOUNTER — Ambulatory Visit (INDEPENDENT_AMBULATORY_CARE_PROVIDER_SITE_OTHER): Payer: HMO

## 2021-12-23 VITALS — BP 144/76 | HR 73 | Ht 65.0 in | Wt 201.6 lb

## 2021-12-23 DIAGNOSIS — R002 Palpitations: Secondary | ICD-10-CM | POA: Diagnosis not present
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
## 2021-12-23 NOTE — Progress Notes (Unsigned)
Enrolled for [employer] to mail a ZIO XT long term holter monitor to the patients address on file.  

## 2021-12-23 NOTE — Patient Instructions (Addendum)
Medication Instructions:  ?Your physician recommends that you continue on your current medications as directed. Please refer to the Current Medication list given to you today. ? ?*If you need a refill on your cardiac medications before your next appointment, please call your pharmacy* ? ? ?Lab Work: ?none ?If you have labs (blood work) drawn today and your tests are completely normal, you will receive your results only by: ?MyChart Message (if you have MyChart) OR ?A paper copy in the mail ?If you have any lab test that is abnormal or we need to change your treatment, we will call you to review the results. ? ? ?Testing/Procedures: ?Your physician has requested that you have an echocardiogram. Echocardiography is a painless test that uses sound waves to create images of your heart. It provides your doctor with information about the size and shape of your heart and how well your heart?s chambers and valves are working. This procedure takes approximately one hour. There are no restrictions for this procedure. ? ? ?ZIO XT- Long Term Monitor Instructions ? ?Your physician has requested you wear a ZIO patch monitor for 3 days.  ?This is a single patch monitor. [employer] supplies one patch monitor per enrollment. Additional ?stickers are not available. Please do not apply patch if you will be having a Nuclear Stress Test,  ?Echocardiogram, Cardiac CT, MRI, or Chest Xray during the period you would be wearing the  ?monitor. The patch cannot be worn during these tests. You cannot remove and re-apply the  ?ZIO XT patch monitor.  ?Your ZIO patch monitor will be mailed 3 day USPS to your address on file. It may take 3-5 days  ?to receive your monitor after you have been enrolled.  ?Once you have received your monitor, please review the enclosed instructions. Your monitor  ?has already been registered assigning a specific monitor serial # to you. ? ?Billing and Patient Assistance Program Information ? ?We have supplied [employer]  with any of your insurance information on file for billing purposes. ?[employer] offers a sliding scale Patient Assistance Program for patients that do not have  ?insurance, or whose insurance does not completely cover the cost of the ZIO monitor.  ?You must apply for the Patient Assistance Program to qualify for this discounted rate.  ?To apply, please call [employer] at [phone], select option 4, select option 2, ask to apply for  ?Patient Assistance Program. Theodore Demark will ask your household income, and how many people  ?are in your household. They will quote your out-of-pocket cost based on that information.  ?[employer] will also be able to set up a 74-month interest-free payment plan if needed. ? ?Applying the monitor ?  ?Shave hair from upper left chest.  ?Hold abrader disc by orange tab. Rub abrader in 40 strokes over the upper left chest as  ?indicated in your monitor instructions.  ?Clean area with 4 enclosed alcohol pads. Let dry.  ?Apply patch as indicated in monitor instructions. Patch will be placed under collarbone on left  ?side of chest with arrow pointing upward.  ?Rub patch adhesive wings for 2 minutes. Remove white label marked "1". Remove the white  ?label marked "2". Rub patch adhesive wings for 2 additional minutes.  ?While looking in a mirror, press and release button in center of patch. A small green light will  ?flash 3-4 times. This will be your only indicator that the monitor has been turned on.  ?Do not shower for the first 24 hours. You may shower after the  first 24 hours.  ?Press the button if you feel a symptom. You will hear a small click. Record Date, Time and  ?Symptom in the Patient Logbook.  ?When you are ready to remove the patch, follow instructions on the last 2 pages of Patient  ?Logbook. Stick patch monitor onto the last page of Patient Logbook.  ?Place Patient Logbook in the blue and white box. Use locking tab on box and tape box closed  ?securely. The blue and white box has  prepaid postage on it. Please place it in the mailbox as  ?soon as possible. Your physician should have your test results approximately 7 days after the  ?monitor has been mailed back to [facility].  ?Call [facility] at [phone] if you have questions regarding  ?your ZIO XT patch monitor. Call them immediately if you see an orange light blinking on your  ?monitor.  ?If your monitor falls off in less than 4 days, contact our Monitor department at [phone].  ?If your monitor becomes loose or falls off after 4 days call [employer] at [phone] for  ?suggestions on securing your monitor ? ? ? ?Follow-Up: ?At [facility], you and your health needs are our priority.  As part of our continuing mission to provide you with exceptional heart care, we have created designated Provider Care Teams.  These Care Teams include your primary Cardiologist (physician) and Advanced Practice Providers (APPs -  Physician Assistants and Nurse Practitioners) who all work together to provide you with the care you need, when you need it. ? ?We recommend signing up for the patient portal called "MyChart".  Sign up information is provided on this After Visit Summary.  MyChart is used to connect with patients for Virtual Visits (Telemedicine).  Patients are able to view lab/test results, encounter notes, upcoming appointments, etc.  Non-urgent messages can be sent to your provider as well.   ?To learn more about what you can do with MyChart, go to [email or web address].   ? ?

## 2021-12-25 DIAGNOSIS — R002 Palpitations: Secondary | ICD-10-CM | POA: Diagnosis not present

## 2022-01-05 DIAGNOSIS — R002 Palpitations: Secondary | ICD-10-CM | POA: Diagnosis not present

## 2022-01-08 ENCOUNTER — Ambulatory Visit (HOSPITAL_COMMUNITY): Payer: HMO | Attending: Cardiology

## 2022-01-08 DIAGNOSIS — E785 Hyperlipidemia, unspecified: Secondary | ICD-10-CM | POA: Insufficient documentation

## 2022-01-08 DIAGNOSIS — R0602 Shortness of breath: Secondary | ICD-10-CM | POA: Insufficient documentation

## 2022-01-08 DIAGNOSIS — E119 Type 2 diabetes mellitus without complications: Secondary | ICD-10-CM | POA: Insufficient documentation

## 2022-01-08 DIAGNOSIS — R002 Palpitations: Secondary | ICD-10-CM | POA: Diagnosis not present

## 2022-01-08 DIAGNOSIS — Z87891 Personal history of nicotine dependence: Secondary | ICD-10-CM | POA: Diagnosis not present

## 2022-01-08 DIAGNOSIS — Z8249 Family history of ischemic heart disease and other diseases of the circulatory system: Secondary | ICD-10-CM | POA: Insufficient documentation

## 2022-01-08 DIAGNOSIS — I10 Essential (primary) hypertension: Secondary | ICD-10-CM

## 2022-01-08 DIAGNOSIS — I119 Hypertensive heart disease without heart failure: Secondary | ICD-10-CM | POA: Insufficient documentation

## 2022-01-08 DIAGNOSIS — R011 Cardiac murmur, unspecified: Secondary | ICD-10-CM | POA: Insufficient documentation

## 2022-01-08 LAB — ECHOCARDIOGRAM COMPLETE
Area-P 1/2: 3.44 cm2
MV M vel: 4.99 m/s
MV Peak grad: 99.6 mmHg
S' Lateral: 3 cm

## 2022-01-13 ENCOUNTER — Telehealth: Payer: Self-pay

## 2022-01-13 NOTE — Telephone Encounter (Signed)
Pt advised her results and Dr. Harrington Challenger' recommendations and she says she does not want to take nay meds at this point for her PVC's.. I explained to her that much of it may be asymptomatic but has been recorded on her monitor but she says she would rather wait for now and she will reach back out to [place] if she decides to try meds... she says she will also hold off on a repeat monitor in 3 months for reassessment and will call if she changes her mind.  ? ?We went over things she can do such as decreasing her caffeine intake, alcohol, sugar... OTC meds with decongestants.  ?

## 2022-01-13 NOTE — Telephone Encounter (Signed)
-----   Message from Fay Records, MD sent at 01/08/2022 11:43 PM EDT ----- ?Monitor shows normal sinus rhythm, sinus tachycardia     ?Frequent PVCs   (20% total) ?REcomm:   Add 25 mg bid    ?Would follow up monitor in 3 months to see how skips are ?

## 2022-02-06 ENCOUNTER — Telehealth: Payer: Self-pay | Admitting: Student

## 2022-02-06 ENCOUNTER — Ambulatory Visit (INDEPENDENT_AMBULATORY_CARE_PROVIDER_SITE_OTHER): Payer: HMO | Admitting: Student

## 2022-02-06 ENCOUNTER — Encounter: Payer: Self-pay | Admitting: Student

## 2022-02-06 VITALS — BP 146/78 | HR 71 | Wt 201.0 lb

## 2022-02-06 DIAGNOSIS — E119 Type 2 diabetes mellitus without complications: Secondary | ICD-10-CM | POA: Diagnosis not present

## 2022-02-06 DIAGNOSIS — I493 Ventricular premature depolarization: Secondary | ICD-10-CM | POA: Diagnosis not present

## 2022-02-06 DIAGNOSIS — I10 Essential (primary) hypertension: Secondary | ICD-10-CM | POA: Diagnosis not present

## 2022-02-06 DIAGNOSIS — S80212A Abrasion, left knee, initial encounter: Secondary | ICD-10-CM | POA: Diagnosis not present

## 2022-02-06 DIAGNOSIS — S80219A Abrasion, unspecified knee, initial encounter: Secondary | ICD-10-CM | POA: Insufficient documentation

## 2022-02-06 MED ORDER — OZEMPIC (0.25 OR 0.5 MG/DOSE) 2 MG/1.5ML ~~LOC~~ SOPN: 0.5000 mg | PEN_INJECTOR | SUBCUTANEOUS | 5 refills | Status: DC

## 2022-02-06 NOTE — Telephone Encounter (Signed)
patient dropped off form at front desk for [place] .  Verified that patient section of form has been completed.  Last DOS/WCC with PCP was 02/06/22.  Placed form in red team folder to be completed by clinical staff.  Creig Hines  ?

## 2022-02-06 NOTE — Assessment & Plan Note (Signed)
Continues to decline beta-blocker therapy.  Counseled on the potential benefits of adding a beta-blocker to reduce risk of PVC induced cardiomyopathy in the future.  She will continue to think about it and will consult with her cardiologist and with me at follow-up. ?

## 2022-02-06 NOTE — Assessment & Plan Note (Addendum)
Left knee abrasion appears to be healing appropriately.  I think that the erythema is likely secondary to irritation rather than infection.  She has been using Neosporin, the neomycin component may be contributing to her irritation.  I gave her some mupirocin to try.  Vaseline alone would be okay as well.  Strong return precautions given for evidence of spreading infection (expanding erythema, purulence, fevers). ?Encouraged her to keep riding her bike as this is some of the best exercise that she can be doing. ?

## 2022-02-06 NOTE — Assessment & Plan Note (Addendum)
Has not been taking Trulicity due to insurance issues. ?-Will transition back to Ozempic 0.5 mg weekly ?-Follow-up in 4 weeks for diabetic check and [name] ?-Patient to try to increase statin dosing to at least every other day ?

## 2022-02-06 NOTE — Assessment & Plan Note (Signed)
BP elevated today to 146/78.  Patient has attempted to change therapies at this time. ?-Continue amlodipine 10 mg daily ?-Patient to keep BP journal ?-We will revisit appropriate therapy at 4-week follow-up ?
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
## 2022-02-06 NOTE — Patient Instructions (Addendum)
Ms. Stegemann, ?It is such a joy to take care you! Thank you for coming in today.  ? ?As a reminder, here is a recap of what we talked about today:  ?- Your leg does not look infected but does look a bit irritated, it's possible that this is from the neosporin, try the Bactroban I am giving you to see if the irritation goes down. If the redness spreads or especially if you start having pus draining from the wound or get fevers, please come back ASAP  ?- I think starting metoprolol as your cardiologist recommended would help with your heart symptoms.  ?- Your BP is higher today than we would like, we may need to adjust your medications next time you come in if it is still high. Please keep a BP journal between now and next time I see you ?- I  am switching you back to Ozempic, it should be available at your pharmacy, you may inject into the side of your thigh if you prefer ?- Try to take your atorvastatin at least every other day (every day would be better) ? ?Take care and seek immediate care sooner if you develop any concerns.  ? ?Marnee Guarneri, MD ?[place] ? ?

## 2022-02-09 NOTE — Telephone Encounter (Signed)
Reviewed form and placed in PCP's box for completion.  .Tocara Mennen R Rhina Kramme, CMA  

## 2022-02-11 NOTE — Telephone Encounter (Signed)
Form is available for pick-up. I have called the patient and let her know. Thanks!

## 2022-02-12 NOTE — Telephone Encounter (Signed)
Form placed up front for pick up and a copy was made for batch scanning.  ?

## 2022-02-19 ENCOUNTER — Other Ambulatory Visit: Payer: Self-pay | Admitting: *Deleted

## 2022-02-19 MED ORDER — MELOXICAM 15 MG PO TABS: 15.0000 mg | ORAL_TABLET | Freq: Every day | ORAL | 0 refills | Status: DC

## 2022-02-19 NOTE — Telephone Encounter (Signed)
Patient is requesting a refill of her meloxicam, she is having issues with her back again.  She is scheduled for her diabetes follow up in a couple of weeks.  Vandora Jaskulski,CMA ? ?

## 2022-03-02 ENCOUNTER — Other Ambulatory Visit: Payer: Self-pay

## 2022-03-02 ENCOUNTER — Encounter: Payer: Self-pay | Admitting: Family Medicine

## 2022-03-02 ENCOUNTER — Ambulatory Visit (INDEPENDENT_AMBULATORY_CARE_PROVIDER_SITE_OTHER): Payer: HMO | Admitting: Family Medicine

## 2022-03-02 VITALS — BP 169/90 | HR 93 | Wt 201.4 lb

## 2022-03-02 DIAGNOSIS — E119 Type 2 diabetes mellitus without complications: Secondary | ICD-10-CM | POA: Diagnosis not present

## 2022-03-02 DIAGNOSIS — I10 Essential (primary) hypertension: Secondary | ICD-10-CM

## 2022-03-02 LAB — POCT GLYCOSYLATED HEMOGLOBIN (HGB A1C): HbA1c, POC (controlled diabetic range): 7.5 % — AB (ref 0.0–7.0)

## 2022-03-02 MED ORDER — LOSARTAN POTASSIUM 25 MG PO TABS: 25.0000 mg | ORAL_TABLET | Freq: Every day | ORAL | 3 refills | Status: DC

## 2022-03-02 NOTE — Progress Notes (Unsigned)
    SUBJECTIVE:   CHIEF COMPLAINT / HPI:   Diabetes Current Regimen: Ozempic 0.5 mg (picked up from the pharmacy but has not been using it; instead she has been using a supplement called diabetes doctor. She also admits to eating candy and sweets late at night in her bed nightly. Last [name]: 6.5 on 12/03/21  Last Eye Exam: 2021; needs to schedule Statin: Lipitor every other day ACE/ARB: n/a  Hypertension - Medications: Amlodipine 10 mg daily - Compliance: Yes - Checking BP at home: Yes usually 150/80 - Denies any SOB, CP, vision changes, LE edema, medication SEs, or symptoms of hypotension  PERTINENT  PMH / PSH: As above.   OBJECTIVE:   BP (!) 169/90   Pulse 93   Wt 201 lb 6.4 oz (91.4 kg)   SpO2 99%   BMI 33.51 kg/m   Results for orders placed or performed in visit on 03/02/22 (from the past 24 hour(s))  HgB [name]     Status: Abnormal   Collection Time: 03/02/22  2:38 PM  Result Value Ref Range   Hemoglobin [name]     HbA1c POC (<> result, manual entry)     HbA1c, POC (prediabetic range)     HbA1c, POC (controlled diabetic range) 7.5 (A) 0.0 - 7.0 %  Basic Metabolic Panel     Status: Abnormal   Collection Time: 03/02/22  3:35 PM  Result Value Ref Range   Glucose 127 (H) 70 - 99 mg/dL   BUN 12 8 - 27 mg/dL   Creatinine, Ser 0.82 0.57 - 1.00 mg/dL   eGFR 79 >59 mL/min/1.73   BUN/Creatinine Ratio 15 12 - 28   Sodium 144 134 - 144 mmol/L   Potassium 4.1 3.5 - 5.2 mmol/L   Chloride 109 (H) 96 - 106 mmol/L   CO2 24 20 - 29 mmol/L   Calcium 9.3 8.7 - 10.3 mg/dL   Narrative   Performed at:  [facility] [address], [place], [place]  [id number] Lab Director: Rush Farmer MD, Phone:  [phone]   General: Appears well, no acute distress. Age appropriate. Cardiac: RRR, normal heart sounds, no murmurs Respiratory: CTAB, normal effort Extremities: No edema or cyanosis. Skin: Warm and dry, no rashes noted Neuro: alert and oriented, no focal deficits Psych:  normal affect  ASSESSMENT/PLAN:   1. Type 2 diabetes mellitus without complication, without long-term current use of insulin (HCC) Not well controlled. Taking supplement instead of ozempic. Will plan to restart ozempic and follow up in 3 months. Was previously well controlled on this.   2. Essential hypertension Not controlled. Elevated in the last few visits. Will start the below and follow up in 2 weeks for BP recheck. Needs BMP at follow up. Continue amlodipine in addition.  - losartan (COZAAR) 25 MG tablet; Take 1 tablet (25 mg total) by mouth at bedtime.  Dispense: 90 tablet; Refill: 3 - Basic Metabolic Panel  Gerlene Fee, [place]

## 2022-03-02 NOTE — Patient Instructions (Signed)
It was wonderful to see you today.  Please bring ALL of your medications with you to every visit.   Today we talked about:  Follow up in 2 weeks for BP recheck. Restart ozempic.   Please be sure to schedule follow up at the front  desk before you leave today.   If you haven't already, sign up for My Chart to have easy access to your labs results, and communication with your primary care physician.  Please call the clinic at [phone] if your symptoms worsen or you have any concerns. It was our pleasure to serve you.  Dr. Janus Molder

## 2022-03-03 LAB — BASIC METABOLIC PANEL
BUN/Creatinine Ratio: 15 (ref 12–28)
BUN: 12 mg/dL (ref 8–27)
CO2: 24 mmol/L (ref 20–29)
Calcium: 9.3 mg/dL (ref 8.7–10.3)
Chloride: 109 mmol/L — ABNORMAL HIGH (ref 96–106)
Creatinine, Ser: 0.82 mg/dL (ref 0.57–1.00)
Glucose: 127 mg/dL — ABNORMAL HIGH (ref 70–99)
Potassium: 4.1 mmol/L (ref 3.5–5.2)
Sodium: 144 mmol/L (ref 134–144)
eGFR: 79 mL/min/{1.73_m2} (ref >59)

## 2022-03-04 ENCOUNTER — Other Ambulatory Visit: Payer: Self-pay | Admitting: Family Medicine

## 2022-03-04 DIAGNOSIS — Z1231 Encounter for screening mammogram for malignant neoplasm of breast: Secondary | ICD-10-CM

## 2022-03-05 ENCOUNTER — Ambulatory Visit
Admission: RE | Admit: 2022-03-05 | Discharge: 2022-03-05 | Disposition: A | Discharge status: Home / Self Care | Payer: HMO | Source: Ambulatory Visit | Attending: Family Medicine | Admitting: Family Medicine

## 2022-03-05 DIAGNOSIS — Z1231 Encounter for screening mammogram for malignant neoplasm of breast: Secondary | ICD-10-CM

## 2022-03-11 ENCOUNTER — Other Ambulatory Visit: Payer: Self-pay | Admitting: Family Medicine

## 2022-03-11 DIAGNOSIS — R928 Other abnormal and inconclusive findings on diagnostic imaging of breast: Secondary | ICD-10-CM

## 2022-03-17 ENCOUNTER — Ambulatory Visit
Admission: RE | Admit: 2022-03-17 | Discharge: 2022-03-17 | Disposition: A | Discharge status: Home / Self Care | Payer: HMO | Source: Ambulatory Visit | Attending: Family Medicine | Admitting: Family Medicine

## 2022-03-17 ENCOUNTER — Encounter: Payer: Self-pay | Admitting: *Deleted

## 2022-03-17 ENCOUNTER — Ambulatory Visit: Payer: HMO

## 2022-03-17 DIAGNOSIS — R928 Other abnormal and inconclusive findings on diagnostic imaging of breast: Secondary | ICD-10-CM

## 2022-03-17 DIAGNOSIS — N6489 Other specified disorders of breast: Secondary | ICD-10-CM | POA: Diagnosis not present

## 2022-03-23 ENCOUNTER — Other Ambulatory Visit: Payer: Self-pay | Admitting: Student

## 2022-05-25 ENCOUNTER — Ambulatory Visit (INDEPENDENT_AMBULATORY_CARE_PROVIDER_SITE_OTHER): Payer: HMO | Admitting: Student

## 2022-05-25 ENCOUNTER — Encounter: Payer: Self-pay | Admitting: Student

## 2022-05-25 VITALS — BP 126/66 | HR 65 | Wt 197.0 lb

## 2022-05-25 DIAGNOSIS — B349 Viral infection, unspecified: Secondary | ICD-10-CM

## 2022-05-25 NOTE — Progress Notes (Deleted)
  SUBJECTIVE:   CHIEF COMPLAINT / HPI:   ***  PERTINENT  PMH / PSH: ***  Past Medical History:  Diagnosis Date   Allergy    Anemia    Anniversary reaction 12/14/2019   Anxiety    Arthritis    Blood dyscrasia    sickle cell trait   Complication of anesthesia    o2 sat drop with epidural for  c-section surgery ;  subsequent surgeries no problems   Diabetes ([place])    TYPE 2 ( 3 YEARS AGO)   Diabetes mellitus without complication ([place])    11-719 had hga1c of 7 prescribed metformin; returned for repeat a1c that was a 5.? ; does not take any diabetes medication currently    Fibroids    uterine   GERD (gastroesophageal reflux disease)    Heart murmur    "i've always had this"    Hyperlipidemia    Hypertension    IBS (irritable bowel syndrome)    Irritable bowel syndrome 08/13/2010   Qualifier: Diagnosis of  By: Nils Pyle CMA (AAMA), [place]     Kidney stone    Lichen planus    Shoulder pain, right 01/11/2020   Upper airway cough syndrome 06/01/2017   Max rx for gerd plus 1st gen h1  06/01/2017 >>>  - Allergy profile 06/01/2017 >  Eos 0.2 /  IgE  64 PAST pos cat grass tree     OBJECTIVE:  There were no vitals taken for this visit.  General: NAD, pleasant, able to participate in exam Cardiac: RRR, no murmurs auscultated Respiratory: CTAB, normal WOB Abdomen: soft, non-tender, non-distended, normoactive bowel sounds Extremities: warm and well perfused, no edema or cyanosis Skin: warm and dry, no rashes noted Neuro: alert, no obvious focal deficits, speech normal Psych: Normal affect and mood  ASSESSMENT/PLAN:  No problem-specific Assessment & Plan notes found for this encounter.   No orders of the defined types were placed in this encounter.  No orders of the defined types were placed in this encounter.  No follow-ups on file. Wells Guiles, DO 05/25/2022, 2:49 PM PGY-***, [facility] {    This will disappear when note is signed, click to select method of visit     :1}

## 2022-05-25 NOTE — Patient Instructions (Signed)
It was great to see you today! Thank you for choosing [facility] for your primary care. Rachael Garcia was seen for cold symptoms with sinus discomfort.  Today we addressed: This is most likely a viral infection. This will take time to get over. The treatment for this is supportive care. You can alternate Tylenol and Ibuprofen for pain or fever every 3 hours (there should be 6 hours in between each dose of Tylenol, and 6 hours in between doses of Ibuprofen). You can give a teaspoon of honey by itself or mixed with water to help cough. Steam baths, Vicks vapor rub, a humidifier and nasal saline spray can help with congestion.  Additionally I would use Flonase with the opposite hand method after using nasal saline spray.  It is important to keep hydrated throughout this time!  Frequent hand washing to prevent recurrent illnesses is important.   Please come back for recurrent symptoms that are not improving in 1-2 weeks, unable to keep fluids down, or any concerning symptoms to you.   If you haven't already, sign up for My Chart to have easy access to your labs results, and communication with your primary care physician.  You should return to our clinic Return if symptoms worsen or fail to improve.  Please arrive 15 minutes before your appointment to ensure smooth check in process.  We appreciate your efforts in making this happen.  Please call the clinic at [phone] if your symptoms worsen or you have any concerns.  Thank you for allowing me to participate in your care, Wells Guiles, DO 05/25/2022, 4:21 PM PGY-2, [place]

## 2022-05-25 NOTE — Assessment & Plan Note (Addendum)
This is likely a viral infection. It will take about 1-2 weeks for symptoms to improve.  Inflamed nasal turbinates bilaterally are impressive but likely related to clinical course and should respond to Flonase. -Continue to use flonase spray with opposite hand technique -Continue humidifier, and PRN Ibuprofen and Tylenol -Start nasal saline spray -Return to care if symptoms worsen or do not improve

## 2022-05-25 NOTE — Progress Notes (Cosign Needed Addendum)
  Date of Visit: 05/25/2022   HPI:  Rachael Garcia is a 66 y.o. female who presents for nasal congestion:  Cough and congestion Cough and nasal congestion started 3-4 days ago. She denies fever, headache, nausea, vomiting, or diarrhea. Her daughter was sick last week with similar symptoms. She tried flonase, Xyzal, Tylenol, Alleve, and a humidifier. Symptoms do not wake her up from sleep. The humidifier has helped her sleep.  She endorses medical history of allergies which may also be affecting her.  PHYSICAL EXAM: BP 126/66   Pulse 65   Wt 197 lb (89.4 kg)   SpO2 93%   BMI 32.78 kg/m  Gen: Well appearing in no distress Pulm: Normal work of breathing. No wheezing or crackles. CV: Normal rate and rhythm. HEENT: L ear TM non-erythematous, non-bulging, no serous effusion. R ear TM not visualized due to cerumen. Inferior nasal turbinates were symmetrically inflamed and enlarged bilaterally. Throat was mildly erythematous without exudates.  Anterior cervical lymphadenopathy bilaterally  ASSESSMENT/PLAN: Viral infection This is likely a viral infection. It will take about 1-2 weeks for symptoms to improve.  Inflamed nasal turbinates bilaterally are impressive but likely related to clinical course and should respond to Flonase. -Continue to use flonase spray with opposite hand technique -Continue humidifier, and PRN Ibuprofen and Tylenol -Start nasal saline spray -Return to care if symptoms worsen or do not improve  Leonette Nutting, [place]  I was personally present and performed or re-performed the history, physical exam and medical decision making activities of this service and have verified that the service and findings are accurately documented in the student's note.  Wells Guiles, DO                  05/25/2022, 5:05 PM

## 2022-08-24 ENCOUNTER — Other Ambulatory Visit: Payer: Self-pay

## 2022-08-24 DIAGNOSIS — E119 Type 2 diabetes mellitus without complications: Secondary | ICD-10-CM

## 2022-08-24 MED ORDER — OZEMPIC (0.25 OR 0.5 MG/DOSE) 2 MG/1.5ML ~~LOC~~ SOPN: 0.5000 mg | PEN_INJECTOR | SUBCUTANEOUS | 5 refills | Status: DC

## 2022-08-28 ENCOUNTER — Encounter: Payer: Self-pay | Admitting: Student

## 2022-08-28 ENCOUNTER — Ambulatory Visit (INDEPENDENT_AMBULATORY_CARE_PROVIDER_SITE_OTHER): Payer: HMO | Admitting: Student

## 2022-08-28 VITALS — BP 136/70 | HR 50 | Wt 197.2 lb

## 2022-08-28 DIAGNOSIS — R449 Unspecified symptoms and signs involving general sensations and perceptions: Secondary | ICD-10-CM | POA: Diagnosis not present

## 2022-08-28 DIAGNOSIS — Z8639 Personal history of other endocrine, nutritional and metabolic disease: Secondary | ICD-10-CM

## 2022-08-28 DIAGNOSIS — E119 Type 2 diabetes mellitus without complications: Secondary | ICD-10-CM | POA: Diagnosis not present

## 2022-08-28 LAB — POCT GLYCOSYLATED HEMOGLOBIN (HGB A1C): HbA1c, POC (controlled diabetic range): 6 % (ref 0.0–7.0)

## 2022-08-28 MED ORDER — BLOOD GLUCOSE MONITOR KIT: PACK | 0 refills | Status: AC

## 2022-08-28 NOTE — Progress Notes (Unsigned)
    SUBJECTIVE:   CHIEF COMPLAINT / HPI:   Here for a diabetes follow-up Last [name] 7.5. Current meds include Ozempic 0.'5mg'$ /week. Doing well. Does not check sugars at home as does not have a meter.  Statin- Lipitor every other day ACE/ARB- Was prescribed Losartan at some point but has only been taking amlodipine  "Discombobulation" Reports feeling "discombobulated" recently. Cannot really describe it other than feeling like something is "off." Denies any shakiness, presyncope, CP, SOB, or dizziness. Does think she has been more stressed recently. Related to family stress with her daughter at risk of losing her housing and the stress of taking care of her mother with dementia. Does not check blood sugars at home. Does check her BP and is usually well-controlled.   Of note, has been taking 5000 units of Vit D either daily or every other day for years. Review of med list does not reveal any other abnormalities.    OBJECTIVE:   BP 136/70   Pulse (!) 50   Wt 197 lb 3.2 oz (89.4 kg)   SpO2 97%   BMI 32.82 kg/m   Gen: Engaged and in good spirits, NAD Eyes: PERRL, EOMs intact without nystagmus Neck: Supple and without LAD Cardio: Regular rate and rhythm without murmur Pulm: Normal WOB on RA, lungs clear in all fields Abd: Soft and flat Neuro: CN II-XII intact, strength and sensation intact throughout, balance and gait normal  ASSESSMENT/PLAN:   Type 2 diabetes mellitus without complication, without long-term current use of insulin (HCC) [name] 6.0% today. Excellent control on Ozempic monotherapy. Would love to get her on an ACE/ARB for renal protection. She is hesitant to transition off of her amlodipine monotherapy as she has good BP control with no side effects. If her BP rises and we need to change her regimen, will add an ARB to her regimen.  - Continue Ozempic 0.'5mg'$  weekly  Feeling abnormal Hard to determine exactly what may be causing this, but in the absence of alarm symptoms or  physical exam abnormalities, I am overall reassured. I suspect this may be primarily due to life stress around her daughter and mother.  - Will check a vit D level to rule out vit D toxicity - Will order glucometer so that she can check a CBG when symptoms arise at home     Pearla Dubonnet, [place]

## 2022-08-28 NOTE — Patient Instructions (Addendum)
Rachael Garcia,  It is always great to see you!  Your [name] today is 6.0%. This is great news!  Keep up the good work.  The feeling of "discombobulation" you are having may just be related to age/life stress. I doubt it is due to low blood surgars, but it is worthwhile getting you a glucose meter for home so that you can check your sugars when you feel that way.   I'm checking a vitamin D level today. Please stop taking your Vit D supplement until this comes back.   I'd love to get you on an Angiotensin-Receptor Blocker med to help protect your kidneys. Look up the medication olmesartan. Perhaps next time I see you we can switch to that for your blood pressure control.   Pearla Dubonnet, MD

## 2022-08-29 DIAGNOSIS — R449 Unspecified symptoms and signs involving general sensations and perceptions: Secondary | ICD-10-CM | POA: Insufficient documentation

## 2022-08-29 LAB — VITAMIN D 25 HYDROXY (VIT D DEFICIENCY, FRACTURES): Vit D, 25-Hydroxy: 30.1 ng/mL (ref 30.0–100.0)

## 2022-08-29 NOTE — Assessment & Plan Note (Addendum)
[name] 6.0% today. Excellent control on Ozempic monotherapy. Would love to get her on an ACE/ARB for renal protection. She is hesitant to transition off of her amlodipine monotherapy as she has good BP control with no side effects. If her BP rises and we need to change her regimen, will add an ARB to her regimen.  - Continue Ozempic 0.'5mg'$  weekly

## 2022-08-29 NOTE — Assessment & Plan Note (Addendum)
Hard to determine exactly what may be causing this, but in the absence of alarm symptoms or physical exam abnormalities, I am overall reassured. I suspect this may be primarily due to life stress around her daughter and mother.  - Will check a vit D level to rule out vit D toxicity - Will order glucometer so that she can check a CBG when symptoms arise at home

## 2022-09-29 ENCOUNTER — Telehealth: Payer: Self-pay | Admitting: *Deleted

## 2022-09-29 DIAGNOSIS — E119 Type 2 diabetes mellitus without complications: Secondary | ICD-10-CM

## 2022-09-29 NOTE — Telephone Encounter (Signed)
Patient left message on referral line asking to see an eye doctor for her diabetes.  She stated that she has noticed some changes and wanted to get established with an office. Will forward to MD.  Rachael Garcia

## 2022-10-02 ENCOUNTER — Telehealth: Payer: Self-pay

## 2022-10-02 NOTE — Telephone Encounter (Signed)
Patient calls nurse line regarding BP medication management. She states that she and provider had discussed starting her on losartan if BP increased. Patient reports that BP was elevated this AM at 234 systolic.   She has not been checking BP at home over the last week. She is asymptomatic at this time.   She reports that she is at a doctor's appointment with her mother now and that they are going to recheck her BP in a few minutes.   She will call back with repeat BP reading.   Talbot Grumbling, RN

## 2022-10-02 NOTE — Telephone Encounter (Signed)
Patient returns call to nurse line.   Repeat BP: 166/90  Patient states that she feels fine at this time with no current symptoms.   Will forward to PCP to determine if losartan should be initiated or if patient needs to schedule visit first.   Talbot Grumbling, RN

## 2022-10-07 ENCOUNTER — Telehealth: Payer: Self-pay | Admitting: Student

## 2022-10-07 DIAGNOSIS — I10 Essential (primary) hypertension: Secondary | ICD-10-CM

## 2022-10-07 MED ORDER — LOSARTAN POTASSIUM 25 MG PO TABS: 25.0000 mg | ORAL_TABLET | Freq: Every day | ORAL | 0 refills | Status: DC

## 2022-10-07 NOTE — Telephone Encounter (Signed)
Discussed high blood pressure and patient states it has been elevated recently, see previous phone note for specific blood pressure measurement. She previously discussed potentially starting ARB (she has tried losartan in the past) in combination with her amlodipine for blood pressure and also her history of diabetes.  I agree with Dr. Adin Hector plan and have initiated losartan 25 mg daily to start on 12/28.  I discussed that lab work is necessary 1 week after starting this medication and patient was amenable to this.  Appointment made with Dr. Nita Sells on 10/15/2022 at 3:10 PM.  BMP should be checked for kidney function and potassium at that visit.  I discussed that once blood pressure is stabilized, Dr. Joelyn Oms may conside combo pill for blood pressure therapy.  Patient was appreciative of phone call.

## 2022-10-14 ENCOUNTER — Other Ambulatory Visit: Payer: Self-pay | Admitting: Student

## 2022-10-15 ENCOUNTER — Encounter: Payer: Self-pay | Admitting: Family Medicine

## 2022-10-15 ENCOUNTER — Ambulatory Visit (INDEPENDENT_AMBULATORY_CARE_PROVIDER_SITE_OTHER): Payer: HMO | Admitting: Family Medicine

## 2022-10-15 VITALS — BP 142/82 | HR 81 | Ht 65.5 in | Wt 199.4 lb

## 2022-10-15 DIAGNOSIS — I10 Essential (primary) hypertension: Secondary | ICD-10-CM

## 2022-10-15 DIAGNOSIS — H43391 Other vitreous opacities, right eye: Secondary | ICD-10-CM | POA: Insufficient documentation

## 2022-10-15 NOTE — Assessment & Plan Note (Signed)
Encouraged her to follow-up with ophthalmology, contact information provided today. Considered Temporal arteritis and CVA but feel this is less likely given lack of ttp of temporal artery, no vision changes, no focal weakness, benign neuro exam.

## 2022-10-15 NOTE — Progress Notes (Signed)
SUBJECTIVE:   CHIEF COMPLAINT / HPI:   Blood Pressure F/U Rachael Garcia is a 67 y.o. female who presents to the clinic today for follow up on blood pressure. Patient was recently seen by her PCP on 11/17.  Her blood pressure was adequate at that time at 136/70. She was only on Amlodipine 10 mg then.  She had then been checking home blood pressures and noticed persistently high blood pressures.  She was then started on losartan 25 mg daily in addition to her Amlodipine.  She returns today for blood pressure check and blood work to check renal function and electrolytes. Unfortunately she reports that she has not yet started her Losartan.   Right Eye Floater  Today she states that she started to see floaters out of her right eye. They are constant. She has had floaters in her eye before but this seems worse. She went to the eye doctor about a week ago and they reassured her. This feels different. Feels some pressure "in the area" of her right eye, worsened when leaning her head down. No double vision or blurred vision. No weakness. No headaches but has some pressure to right temple, "like someone who has a sinus pressure headache". She has taken Tylenol and Ibuprofen for the discomfort which helped temporarily.   PERTINENT  PMH / PSH:  Past Medical History:  Diagnosis Date   Allergy    Anemia    Anniversary reaction 12/14/2019   Anxiety    Arthritis    Blood dyscrasia    sickle cell trait   Complication of anesthesia    o2 sat drop with epidural for  c-section surgery ;  subsequent surgeries no problems   Diabetes ([place])    TYPE 2 ( 3 YEARS AGO)   Diabetes mellitus without complication ([place])    05-1016 had hga1c of 7 prescribed metformin; returned for repeat a1c that was a 5.? ; does not take any diabetes medication currently    Fibroids    uterine   GERD (gastroesophageal reflux disease)    Heart murmur    "i've always had this"    Hyperlipidemia    Hypertension    IBS (irritable  bowel syndrome)    Irritable bowel syndrome 08/13/2010   Qualifier: Diagnosis of  By: Nils Pyle CMA (AAMA), [place]     Kidney stone    Lichen planus    Shoulder pain, right 01/11/2020   Upper airway cough syndrome 06/01/2017   Max rx for gerd plus 1st gen h1  06/01/2017 >>>  - Allergy profile 06/01/2017 >  Eos 0.2 /  IgE  64 PAST pos cat grass tree    OBJECTIVE:   BP (!) 142/82   Pulse 81   Ht 5' 5.5" (1.664 m)   Wt 199 lb 6.4 oz (90.4 kg)   SpO2 100%   BMI 32.68 kg/m   Vitals:   10/15/22 1530 10/15/22 1600  BP: (!) 136/94 (!) 142/82  Pulse:    SpO2:      General: NAD, pleasant, able to participate in exam HEENT: Normocephalic, EOMI, PERRLA, mild ttp maxillary sinuses bilaterally, no ttp right temporal artery, no papilledema Respiratory: normal effort Skin: warm and dry, no rashes noted Neuro: Cranial Nerves: II: Visual Fields are full. PERRL.  III,IV, VI: EOMI without ptosis or diplopia.  V: Facial sensation is symmetric to temperature VII: Facial movement is symmetric.  VIII: hearing is intact to voice X: Palate elevates symmetrically XI: Shoulder shrug is symmetric.  XII: tongue is midline without atrophy or fasciculations.  Motor: Tone is normal. Bulk is normal. 5/5 strength was present in all four extremities.  Sensory: Sensation is symmetric to light touch and temperature in the arms and legs. Cerebellar: FNF and HKS are intact bilaterally Psych: Normal affect and mood  ASSESSMENT/PLAN:   Essential hypertension Her blood pressure still elevated above goal x 2 checks today.  Recommended that she restart her losartan in addition to her amlodipine given this.  She is amenable to starting it.  I encouraged her to schedule a follow-up for blood pressure check and for blood work 1 to 2 weeks after starting her medication. She will schedule at the front/call to schedule.   Vitreous floaters of right eye Encouraged her to follow-up with ophthalmology, contact information  provided today. Considered Temporal arteritis and CVA but feel this is less likely given lack of ttp of temporal artery, no vision changes, no focal weakness, benign neuro exam.        Sharion Settler, DO [place]

## 2022-10-15 NOTE — Assessment & Plan Note (Signed)
Her blood pressure still elevated above goal x 2 checks today.  Recommended that she restart her losartan in addition to her amlodipine given this.  She is amenable to starting it.  I encouraged her to schedule a follow-up for blood pressure check and for blood work 1 to 2 weeks after starting her medication. She will schedule at the front/call to schedule.

## 2022-10-15 NOTE — Patient Instructions (Addendum)
It was wonderful to see you today.  Please bring ALL of your medications with you to every visit.   Today we talked about:  I do not think this is a medical emergency like a stroke at this time. I do think you would benefit from a more comprehensive eye examination. It looks like a referral was previously placed. I would encourage you to call the number below to schedule an appointment.  [facility] Address: [address], [place], [place] [address] Phone: [phone]  Your blood pressure was slightly elevated today. I would encourage you to start the Losartan. It is important that you return for a blood pressure check and blood work 1-2 weeks after starting it.   Thank you for coming to your visit as scheduled. We have had a large "no-show" problem lately, and this significantly limits our ability to see and care for patients. As a friendly reminder- if you cannot make your appointment please call to cancel. We do have a no show policy for those who do not cancel within 24 hours. Our policy is that if you miss or fail to cancel an appointment within 24 hours, 3 times in a 75-monthperiod, you may be dismissed from our clinic.   Thank you for choosing [facility]   Please call [phone] any questions about today's appointment.  Please be sure to schedule follow up at the front  desk before you leave today.   ASharion Settler DO PGY-3 Family Medicine

## 2022-10-16 ENCOUNTER — Other Ambulatory Visit: Payer: Self-pay | Admitting: Family Medicine

## 2022-10-16 ENCOUNTER — Telehealth: Payer: Self-pay

## 2022-10-16 DIAGNOSIS — J320 Chronic maxillary sinusitis: Secondary | ICD-10-CM

## 2022-10-16 MED ORDER — AMOXICILLIN 500 MG PO TABS: 500.0000 mg | ORAL_TABLET | Freq: Two times a day (BID) | ORAL | 0 refills | Status: AC

## 2022-10-16 NOTE — Telephone Encounter (Signed)
Patient calls nurse line to follow up on yesterday's visit. Patient reports that ophthalmologist is unable to see her until June. She states that she is having continued right sided headache when she bends over, coughs or sneezes.   She has also noticed an increased amount of drainage from sinuses. She is asking if she should receive antibiotic for possible sinus infection.   Please advise next steps.   Talbot Grumbling, RN

## 2022-10-16 NOTE — Telephone Encounter (Signed)
Rx amoxicillin sent.

## 2022-11-30 ENCOUNTER — Encounter: Payer: Self-pay | Admitting: Student

## 2022-11-30 ENCOUNTER — Ambulatory Visit (INDEPENDENT_AMBULATORY_CARE_PROVIDER_SITE_OTHER): Payer: HMO | Admitting: Student

## 2022-11-30 ENCOUNTER — Other Ambulatory Visit: Payer: Self-pay

## 2022-11-30 VITALS — BP 137/80 | HR 84 | Wt 199.0 lb

## 2022-11-30 DIAGNOSIS — H938X2 Other specified disorders of left ear: Secondary | ICD-10-CM

## 2022-11-30 DIAGNOSIS — I10 Essential (primary) hypertension: Secondary | ICD-10-CM | POA: Diagnosis not present

## 2022-11-30 DIAGNOSIS — E119 Type 2 diabetes mellitus without complications: Secondary | ICD-10-CM | POA: Diagnosis not present

## 2022-11-30 DIAGNOSIS — Z0000 Encounter for general adult medical examination without abnormal findings: Secondary | ICD-10-CM | POA: Diagnosis not present

## 2022-11-30 DIAGNOSIS — R42 Dizziness and giddiness: Secondary | ICD-10-CM | POA: Diagnosis not present

## 2022-11-30 DIAGNOSIS — J301 Allergic rhinitis due to pollen: Secondary | ICD-10-CM

## 2022-11-30 LAB — POCT GLYCOSYLATED HEMOGLOBIN (HGB A1C): HbA1c, POC (controlled diabetic range): 6.3 % (ref 0.0–7.0)

## 2022-11-30 MED ORDER — AMLODIPINE BESYLATE 10 MG PO TABS: 10.0000 mg | ORAL_TABLET | Freq: Every day | ORAL | 3 refills | Status: DC

## 2022-11-30 MED ORDER — PSEUDOEPHEDRINE HCL 30 MG PO TABS: 30.0000 mg | ORAL_TABLET | Freq: Every day | ORAL | 0 refills | Status: DC | PRN

## 2022-11-30 MED ORDER — MELOXICAM 15 MG PO TABS: 15.0000 mg | ORAL_TABLET | Freq: Every day | ORAL | 0 refills | Status: DC

## 2022-11-30 MED ORDER — FLUTICASONE PROPIONATE 50 MCG/ACT NA SUSP: 2.0000 | Freq: Every day | NASAL | 11 refills | Status: DC

## 2022-11-30 MED ORDER — LOSARTAN POTASSIUM 25 MG PO TABS: 25.0000 mg | ORAL_TABLET | Freq: Every day | ORAL | 0 refills | Status: DC

## 2022-11-30 NOTE — Progress Notes (Unsigned)
    SUBJECTIVE:   CHIEF COMPLAINT / HPI:   L Ear Fullness Has had issues on that side for years. Known conductive hearing loss that was thoroughly worked up back in 2011 including MRI of the brain which showed no evidence of vestibular schwannoma. Hearing loss is stable at this time, as is her related chronic disequilibrium, but the sensation of fullness is worse over the past two weeks or so. She notes that she also had a fall about two weeks ago, thinks that she tripped over a box, but notes that her disequilibrium certainly may have played into the fall.  Has noticed seasonal allergies worse over the same time period with congestion/rhinorrhea. Taking Xyzal and Zyrtec on alternate days, has taken Flonase on-and-off in the past but currently out of supply.   HTN Needs refill of amlodipine. Takes Losartan "sometimes."  Health Maintenance Needs COVID, flu, shingles, and pneumonia vaccines.  PERTINENT  PMH / PSH: ***  OBJECTIVE:   BP 137/80   Pulse 84   Wt 199 lb (90.3 kg)   SpO2 98%   BMI 32.61 kg/m   ***  ASSESSMENT/PLAN:   No problem-specific Assessment & Plan notes found for this encounter.     Marnee Guarneri, MD [place]  Longstanding hearing loss and dizziness. Thought to vestibular.  Takign Xyzal and Zyrtec alternating. Out of flonase.   Vehemently declines shots today.

## 2022-11-30 NOTE — Patient Instructions (Addendum)
I don't think there's anything scary in your brain based on your past MRI and your symptoms today. I think the fullness is from allergies. Keep taking either the Xyzal or Zyrtec, whichever seems to help more.  I am refilling your flonase. I also want you to try three days of pseudoephedrine (Sudafed). If this helps, keep taking it for two weeks, if not, forget it.   I am sending you to vestibular rehab. These folks specialize in dizziness related to hearing/inner ear problems. They will do a full evaluation and recommend balance/equilibrium exercises to prevent falls in the future.  I will bother you about shots again next time I see you.    Marnee Guarneri, MD

## 2022-12-01 DIAGNOSIS — H938X2 Other specified disorders of left ear: Secondary | ICD-10-CM | POA: Insufficient documentation

## 2022-12-01 NOTE — Assessment & Plan Note (Signed)
At goal today.  Discussed regular dosing of losartan. -Continue amlodipine 10 mg daily and losartan 25 mg daily -BMP today as has not been checked since starting losartan

## 2022-12-01 NOTE — Assessment & Plan Note (Addendum)
Hum test lateralizing to this side suggests conductive hearing loss, consistent with prior workup.  Benign exam today aside from known hearing loss on that side.  Reassured by the normal MRI that she has had in the past.  Suspect that her symptoms at this time are likely exacerbated by seasonal allergies.   I am however concerned that she recently had a fall.  Certainly would not want to minimize the risk of future falls.  She is amenable to a neuro rehab at this time.  If her symptoms change or progress, low threshold to repeat MRI of the brain and refer to ENT for repeat/renewed workup. -Ambulatory referral to neurorehab -Continue Zyrtec and Xyzal on alternating days -Will refill Flonase -3-day trial of Sudafed, if it is helpful, she will continue for 2 weeks

## 2022-12-01 NOTE — Assessment & Plan Note (Signed)
[name] at goal today.  6.3%.  Currently controlled just on Ozempic 0.5 mg weekly.  Remains on a statin. -Urine albumin creatinine ratio today

## 2022-12-01 NOTE — Assessment & Plan Note (Signed)
Discussed the recommended vaccines.  She continues to decline.  Advised that we will revisit this next time I see her.

## 2022-12-04 ENCOUNTER — Ambulatory Visit: Payer: HMO | Attending: Family Medicine | Admitting: Physical Therapy

## 2022-12-04 ENCOUNTER — Encounter: Payer: Self-pay | Admitting: Physical Therapy

## 2022-12-04 VITALS — BP 140/78

## 2022-12-04 DIAGNOSIS — R2681 Unsteadiness on feet: Secondary | ICD-10-CM | POA: Diagnosis not present

## 2022-12-04 DIAGNOSIS — R42 Dizziness and giddiness: Secondary | ICD-10-CM | POA: Insufficient documentation

## 2022-12-04 NOTE — Therapy (Signed)
OUTPATIENT PHYSICAL THERAPY VESTIBULAR EVALUATION     Patient Name: Rachael Garcia MRN: [id number] DOB:August 10, 1956, 67 y.o., female Today's Date: 12/04/2022  END OF SESSION:  PT End of Session - 12/04/22 1546     Visit Number 1    Number of Visits 9    Date for PT Re-Evaluation 01/03/23    Authorization Type [employer]    PT Start Time 1448    PT Stop Time 1532    PT Time Calculation (min) 44 min    Activity Tolerance Patient tolerated treatment well    Behavior During Therapy [facility] for tasks assessed/performed             Past Medical History:  Diagnosis Date   Allergy    Anemia    Anniversary reaction 12/14/2019   Anxiety    Arthritis    Blood dyscrasia    sickle cell trait   Complication of anesthesia    o2 sat drop with epidural for  c-section surgery ;  subsequent surgeries no problems   Diabetes ([place])    TYPE 2 ( 3 YEARS AGO)   Diabetes mellitus without complication ([place])    0000000 had hga1c of 7 prescribed metformin; returned for repeat a1c that was a 5.? ; does not take any diabetes medication currently    Fibroids    uterine   GERD (gastroesophageal reflux disease)    Heart murmur    "i've always had this"    Hyperlipidemia    Hypertension    IBS (irritable bowel syndrome)    Irritable bowel syndrome 08/13/2010   Qualifier: Diagnosis of  By: Nils Pyle CMA (AAMA), Leisha     Kidney stone    Lichen planus    Shoulder pain, right 01/11/2020   Upper airway cough syndrome 06/01/2017   Max rx for gerd plus 1st gen h1  06/01/2017 >>>  - Allergy profile 06/01/2017 >  Eos 0.2 /  IgE  64 PAST pos cat grass tree    Past Surgical History:  Procedure Laterality Date   CARPAL TUNNEL RELEASE Right 03   CARPAL TUNNEL RELEASE  08/20/2011   Procedure: CARPAL TUNNEL RELEASE;  Surgeon: Cammie Sickle., MD;  Location: [facility];  Service: Orthopedics;  Laterality: Left;   CESAREAN SECTION  83,92   x 2   COLONOSCOPY     DIAGNOSTIC LAPAROSCOPY      ovarian cyst removal   DILATION AND CURETTAGE OF UTERUS     MASS EXCISION Right 07/03/2015   Procedure: EXCISION RIGHT LONG FINGER MASS;  Surgeon: Charlotte Crumb, MD;  Location: [place] [facility];  Service: Orthopedics;  Laterality: Right;   ROBOTIC ASSISTED BILATERAL SALPINGO OOPHERECTOMY Bilateral 06/29/2017   Procedure: XI ROBOTIC ASSISTED BILATERAL SALPINGO OOPHORECTOMY WITH PERITONEAL WASHINGS;  Surgeon: Everitt Amber, MD;  Location: [facility];  Service: Gynecology;  Laterality: Bilateral;   TUBAL LIGATION     Patient Active Problem List   Diagnosis Date Noted   Sensation of fullness in left ear 12/01/2022   Vitreous floaters of right eye 10/15/2022   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   Health care maintenance [id number]   Lichen planus [id number]   Anxiety 05/15/2020   Type 2 diabetes mellitus without complication, without long-term current use of insulin ([place]) 12/14/2019   Left ovarian cyst 06/29/2017   Fibroids 06/29/2017   Neuropathy in diabetes [facility])    Lumbosacral disc disease    Hyperlipidemia 08/14/2010   Essential hypertension 08/14/2010   LACTOSE INTOLERANCE 08/13/2010  SICKLE CELL TRAIT 08/13/2010   GERD 08/13/2010   Allergic rhinitis 04/04/2007   ASTHMA 04/04/2007    PCP: Eppie Gibson, MD   REFERRING PROVIDER: Kinnie Feil, MD   REFERRING DIAG: R42 (ICD-10-CM) - Dizziness   THERAPY DIAG:  Unsteadiness on feet  Dizziness and giddiness  ONSET DATE: 11/30/2022  Rationale for Evaluation and Treatment: Rehabilitation  SUBJECTIVE:   SUBJECTIVE STATEMENT: Feels discombobulated. Feels stopped up or blocked (on the left side of her head) and it messes up her balance. Feels like a "spaciness." Reports this has been going on for the past 2.5 weeks, but before that feels like something in her head isn't quite right and is feeling off balanced. Thought it could be sinus related. [place] about 2 weeks ago in front of a store, thinks she tripped over a box. Is  still able to drive and walk. Walking too fast or too slow will make her feel a little off balance. Feels like she has been like this way for a while. Has had vertigo in the past where she laid down in her bed and the room was spinning. Did not see a PT for it. Reports that was about 20 years ago. Head feels congested. "Its not that bad, just bad enough to know that I don't feel right". Has not seen an ENT in a while.  Pt accompanied by: self  PERTINENT HISTORY: PMH: Type 2 diabetes, HLD, HTN  Pt with L ear fullness and known conductive hearing loss   Per PCP: Hearing loss is stable at this time, as is her related chronic disequilibrium, but the sensation of fullness is worse over the past two weeks or so.  She notes that she also had a fall about two weeks ago, thinks that she tripped over a box, but notes that her disequilibrium certainly may have played into the fall.  Disequilibrium has been worked up in the past as well, thought to be primarily vestibular in origin and possibly related to her hearing loss.   PAIN:  Are you having pain? No  Vitals:   12/04/22 1507  BP: (!) 140/78     PRECAUTIONS: Fall  WEIGHT BEARING RESTRICTIONS: No  FALLS: Has patient fallen in last 6 months? Yes. Number of falls 1, just had moments that she knew if she didn't slow down, then would probably lose her balance.   LIVING ENVIRONMENT: Lives with:  lives with her partner Lives in: House/apartment Stairs: Yes: Internal: 12 steps; on right going up Has following equipment at home: Single point cane and has a cane and does not typically use it   PLOF: Independent  PATIENT GOALS: Hoping to not feel off balance, wants to not feel like she could endanger herself.   OBJECTIVE:   COGNITION: Overall cognitive status: Within functional limits for tasks assessed   POSTURE:  No Significant postural limitations  GAIT: Gait pattern: step through pattern, some unsteadiness, but pt pt with no  LOB Distance walked: Clinic distances  Assistive device utilized: None Level of assistance: Complete Independence  PATIENT SURVEYS:  Academic librarian did not capture   VESTIBULAR ASSESSMENT:  GENERAL OBSERVATION: Ambulates with no AD   SYMPTOM BEHAVIOR:  Subjective history: See AD, reports acoustics in tx room make her feel a little off   Non-Vestibular symptoms:  N/A  Type of dizziness: Imbalance (Disequilibrium), Unsteady with head/body turns, and can feel a spinning sensation if moving too quickly   Frequency: Not that much, has learned to take  her time   Duration: Not long  Aggravating factors: Induced by motion: turning body quickly and turning head quickly, sitting still sometimes will feel a fullness in her head on the L side   Relieving factors: lying supine  Progression of symptoms: unchanged  OCULOMOTOR EXAM:  Ocular Alignment: normal  Ocular ROM: No Limitations  Spontaneous Nystagmus: absent  Gaze-Induced Nystagmus: absent  Smooth Pursuits: intact  Saccades: intact, felt a floater pop up in her R eye, slower to perform in vertical direction     VESTIBULAR - OCULAR REFLEX:   Slow VOR: Normal  VOR Cancellation: Normal felt a little off   Head-Impulse Test: HIT Right: positive - very slight  HIT Left: negative  Dynamic Visual Acuity: Static: Line 6 Dynamic: Line 8 Pt did not wear his glasses, but could've read a farther line Felt discombobulated afterwards    POSITIONAL TESTING: Right Roll Test: no nystagmus Left Roll Test: no nystagmus Right Sidelying: no nystagmus and feels a little discombobulated  Left Sidelying: no nystagmus  Pt feeling a little discombobulated coming to upright.     M-CTSIB  Condition 1: Firm Surface, EO 30 Sec, Normal Sway  Condition 2: Firm Surface, EC 30 Sec, Mild Sway  Condition 3: Foam Surface, EO 30 Sec, Mild/Mod Sway  Condition 4: Foam Surface, EC 30 Sec, Mild/Mod Sway      VESTIBULAR TREATMENT:                                                                                                    N/A during eval.   PATIENT EDUCATION: Education details: Clinical findings, POC, 3 systems for balance, possible benefit of following up with ENT regarding fullness/sinus pressure.  Person educated: Patient Education method: Explanation Education comprehension: verbalized understanding  HOME EXERCISE PROGRAM: Will provide at future session.   GOALS: Goals reviewed with patient? Yes  SHORT TERM GOALS:   LONG TERM GOALS: Target date: 01/01/2023  Pt will be independent with final HEP for balance/vestibular deficits in order to build upon functional gains made in therapy. Baseline:  Goal status: INITIAL  2.  FGA to be assessed with goal written.  Baseline:  Goal status: INITIAL  3.  SOT to be assessed with goal written.  Baseline:  Goal status: INITIAL  4.  Pt will demo a 1 line or less difference on DVA in order to demo improved gaze stabilization.  Baseline: 2 line difference -could've been greater difference, but pt not wearing glasses today and pt feeling discombobulated afterwards Goal status: INITIAL   ASSESSMENT:  CLINICAL IMPRESSION: Patient is a 67 year old female referred to Neuro OPPT for Dizziness. Pt mainly describes dizziness as feeling off balanced and discombobulated. Pt also reports feeling that head feels congested. Pt may benefit from seeing an ENT regarding this. Pt's PMH is significant for: Type 2 diabetes, HLD, HTN. The following deficits were present during the exam: impaired balance, slight positive HIT to the R, 2 line difference on DVA and pt feeling discombobulated. Pt unsteady with conditions 2-4 with mCTSIB, but able to hold for 30 seconds. Pt  with incr visual input for her balance. Pt with no dizziness/nystagmus with positional testing, but did feel discombobulated in R sidelying and when coming to upright. Will perform further balance testing at next session. Pt would benefit from  skilled PT to address these impairments and functional limitations to maximize functional mobility independence and decr dizziness.    OBJECTIVE IMPAIRMENTS: decreased activity tolerance, decreased balance, and dizziness.   ACTIVITY LIMITATIONS: locomotion level  PARTICIPATION LIMITATIONS: community activity  PERSONAL FACTORS: Age, Behavior pattern, Past/current experiences, Time since onset of injury/illness/exacerbation, and 3+ comorbidities: Type 2 diabetes, HLD, HTN  are also affecting patient's functional outcome.   REHAB POTENTIAL: Good  CLINICAL DECISION MAKING: Stable/uncomplicated  EVALUATION COMPLEXITY: Low   PLAN:  PT FREQUENCY: 2x/week  PT DURATION: 4 weeks  PLANNED INTERVENTIONS: Therapeutic exercises, Therapeutic activity, Neuromuscular re-education, Balance training, Gait training, Patient/Family education, Self Care, Vestibular training, Manual therapy, and Re-evaluation  PLAN FOR NEXT SESSION: Perform SOT and FGA and write goals. Initial HEP based on findings and balance with EC, VOR    Arliss Journey, PT, DPT  12/04/2022, 3:47 PM

## 2022-12-08 ENCOUNTER — Ambulatory Visit: Payer: HMO | Admitting: Physical Therapy

## 2022-12-08 ENCOUNTER — Telehealth: Payer: Self-pay | Admitting: Physical Therapy

## 2022-12-08 VITALS — BP 150/50

## 2022-12-08 DIAGNOSIS — R2681 Unsteadiness on feet: Secondary | ICD-10-CM

## 2022-12-08 DIAGNOSIS — R42 Dizziness and giddiness: Secondary | ICD-10-CM

## 2022-12-08 DIAGNOSIS — I493 Ventricular premature depolarization: Secondary | ICD-10-CM

## 2022-12-08 LAB — BASIC METABOLIC PANEL
BUN/Creatinine Ratio: 19 (ref 12–28)
BUN: 11 mg/dL (ref 8–27)
CO2: 23 mmol/L (ref 20–29)
Calcium: 8.7 mg/dL (ref 8.7–10.3)
Chloride: 105 mmol/L (ref 96–106)
Creatinine, Ser: 0.59 mg/dL (ref 0.57–1.00)
Glucose: 112 mg/dL — ABNORMAL HIGH (ref 70–99)
Potassium: 4.3 mmol/L (ref 3.5–5.2)
Sodium: 143 mmol/L (ref 134–144)
eGFR: 99 mL/min/{1.73_m2} (ref >59)

## 2022-12-08 LAB — MICROALBUMIN / CREATININE URINE RATIO

## 2022-12-08 NOTE — Therapy (Signed)
[facility] [address] [place], [place], [address] Phone: [phone]   Fax:  [phone]  Patient Details  Name: Rachael Garcia MRN: [id number] Date of Birth: 06-04-1956 Referring Provider:  Eppie Gibson, MD  Encounter Date: 12/08/2022  Session was arrive no charge. Patient arrives to session stating she feels "off" and like things are moving around her though she is sitting still. Patient states her symptoms occur primarily while seated or standing still and not with motion. Reports having previous cardiac workup showing PVCs and possible murmur a year ago but nothing else indicated treatment at the time per patient.  Therapist assessed vitals with manual BP and manual HR. HR was faint intermixed with strong beats, irregular, with long pauses between. Patient BP also as noted below. Given presentation in today's session, therapist recommended follow up with cardiology and PCP before additional vestibular treatment given findings in today. Patient verbalized understanding.  Vitals:   12/08/22 1515  BP: (!) 150/50     Esperanza Heir, PT, DPT 12/08/2022, 3:39 PM  [facility] [address] [place], [place], [address] Phone: [phone]   Fax:  [phone]

## 2022-12-08 NOTE — Telephone Encounter (Signed)
Patient had a monitor last year   20% PVCs   I placed on b blocker   She did not come back for repeat monitor after being on meds  Please order a 7 day monitor and then appt to see me

## 2022-12-08 NOTE — Therapy (Deleted)
OUTPATIENT PHYSICAL THERAPY VESTIBULAR TREATMENT  Patient Name: Rachael Garcia MRN: [id number] DOB:Jun 13, 1956, 67 y.o., female Today's Date: 12/08/2022  END OF SESSION:  PT End of Session - 12/08/22 1457     Visit Number 2    Number of Visits 9    Date for PT Re-Evaluation 01/03/23    Authorization Type [employer]    PT Start Time 1455    Equipment Utilized During Treatment Gait belt    Activity Tolerance Patient tolerated treatment well    Behavior During Therapy WFL for tasks assessed/performed             Past Medical History:  Diagnosis Date   Allergy    Anemia    Anniversary reaction 12/14/2019   Anxiety    Arthritis    Blood dyscrasia    sickle cell trait   Complication of anesthesia    o2 sat drop with epidural for  c-section surgery ;  subsequent surgeries no problems   Diabetes ([place])    TYPE 2 ( 3 YEARS AGO)   Diabetes mellitus without complication ([place])    0000000 had hga1c of 7 prescribed metformin; returned for repeat a1c that was a 5.? ; does not take any diabetes medication currently    Fibroids    uterine   GERD (gastroesophageal reflux disease)    Heart murmur    "i've always had this"    Hyperlipidemia    Hypertension    IBS (irritable bowel syndrome)    Irritable bowel syndrome 08/13/2010   Qualifier: Diagnosis of  By: Nils Pyle CMA (AAMA), Leisha     Kidney stone    Lichen planus    Shoulder pain, right 01/11/2020   Upper airway cough syndrome 06/01/2017   Max rx for gerd plus 1st gen h1  06/01/2017 >>>  - Allergy profile 06/01/2017 >  Eos 0.2 /  IgE  64 PAST pos cat grass tree    Past Surgical History:  Procedure Laterality Date   CARPAL TUNNEL RELEASE Right 03   CARPAL TUNNEL RELEASE  08/20/2011   Procedure: CARPAL TUNNEL RELEASE;  Surgeon: Cammie Sickle., MD;  Location: [facility];  Service: Orthopedics;  Laterality: Left;   CESAREAN SECTION  83,92   x 2   COLONOSCOPY     DIAGNOSTIC LAPAROSCOPY     ovarian cyst  removal   DILATION AND CURETTAGE OF UTERUS     MASS EXCISION Right 07/03/2015   Procedure: EXCISION RIGHT LONG FINGER MASS;  Surgeon: Charlotte Crumb, MD;  Location: [facility];  Service: Orthopedics;  Laterality: Right;   ROBOTIC ASSISTED BILATERAL SALPINGO OOPHERECTOMY Bilateral 06/29/2017   Procedure: XI ROBOTIC ASSISTED BILATERAL SALPINGO OOPHORECTOMY WITH PERITONEAL WASHINGS;  Surgeon: Everitt Amber, MD;  [facility];  Service: Gynecology;  Laterality: Bilateral;   TUBAL LIGATION     Patient Active Problem List   Diagnosis Date Noted   Sensation of fullness in left ear 12/01/2022   Vitreous floaters of right eye 10/15/2022   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   Health care maintenance [id number]   Lichen planus [id number]   Anxiety 05/15/2020   Type 2 diabetes mellitus without complication, without long-term current use of insulin ([place]) 12/14/2019   Left ovarian cyst 06/29/2017   Fibroids 06/29/2017   Neuropathy in diabetes [facility])    Lumbosacral disc disease    Hyperlipidemia 08/14/2010   Essential hypertension 08/14/2010   LACTOSE INTOLERANCE 08/13/2010   SICKLE CELL TRAIT 08/13/2010   GERD 08/13/2010  Allergic rhinitis 04/04/2007   ASTHMA 04/04/2007    PCP: Eppie Gibson, MD REFERRING PROVIDER: Kinnie Feil, MD   REFERRING DIAG: R42 (ICD-10-CM) - Dizziness   THERAPY DIAG:  No diagnosis found.  ONSET DATE: 11/30/2022  Rationale for Evaluation and Treatment: Rehabilitation  SUBJECTIVE:   SUBJECTIVE STATEMENT: Patient reports that in general she is feeling less spacey and swaying. States she notices her symptoms the most when she is sitting still. Feels a little swimmy headed. Reports no symptoms when driving or moving around. Patient reports that she think it may be sinus related. States it feels like an unsteadiness. Notices it more than when she is sitting still for too long. Ranks symptoms as 4/10 and 5/10 before last seen. Reports some mild  improvements with pseudo fed. Patient reports that she is not regularly checking her blood glucose levels.   Pt accompanied by: self  PERTINENT HISTORY: PMH: Type 2 diabetes, HLD, HTN  Pt with L ear fullness and known conductive hearing loss   Per PCP: Hearing loss is stable at this time, as is her related chronic disequilibrium, but the sensation of fullness is worse over the past two weeks or so.  She notes that she also had a fall about two weeks ago, thinks that she tripped over a box, but notes that her disequilibrium certainly may have played into the fall.  Disequilibrium has been worked up in the past as well, thought to be primarily vestibular in origin and possibly related to her hearing loss.   PAIN:  Are you having pain? No  Vitals:   12/08/22 1515  BP: (!) 150/50    HR: irregular  PRECAUTIONS: Fall  WEIGHT BEARING RESTRICTIONS: No  FALLS: Has patient fallen in last 6 months? Yes. Number of falls 1, just had moments that she knew if she didn't slow down, then would probably lose her balance.   LIVING ENVIRONMENT: Lives with:  lives with her partner Lives in: House/apartment Stairs: Yes: Internal: 12 steps; on right going up Has following equipment at home: Single point cane and has a cane and does not typically use it   PLOF: Independent  PATIENT GOALS: Hoping to not feel off balance, wants to not feel like she could endanger herself.   OBJECTIVE:   COGNITION: Overall cognitive status: Within functional limits for tasks assessed   POSTURE:  No Significant postural limitations  GAIT: Gait pattern: step through pattern, some unsteadiness, but pt pt with no LOB Distance walked: Clinic distances  Assistive device utilized: None Level of assistance: Complete Independence  PATIENT SURVEYS:  Academic librarian did not capture   VESTIBULAR ASSESSMENT:  GENERAL OBSERVATION: Ambulates with no AD   SYMPTOM BEHAVIOR:  Subjective history: See AD, reports acoustics in  tx room make her feel a little off   Non-Vestibular symptoms:  N/A  Type of dizziness: Imbalance (Disequilibrium), Unsteady with head/body turns, and can feel a spinning sensation if moving too quickly   Frequency: Not that much, has learned to take her time   Duration: Not long  Aggravating factors: Induced by motion: turning body quickly and turning head quickly, sitting still sometimes will feel a fullness in her head on the L side   Relieving factors: lying supine  Progression of symptoms: unchanged  OCULOMOTOR EXAM:  Ocular Alignment: normal  Ocular ROM: No Limitations  Spontaneous Nystagmus: absent  Gaze-Induced Nystagmus: absent  Smooth Pursuits: intact  Saccades: intact, felt a floater pop up in her R eye, slower to  perform in vertical direction     VESTIBULAR - OCULAR REFLEX:   Slow VOR: Normal  VOR Cancellation: Normal felt a little off   Head-Impulse Test: HIT Right: positive - very slight  HIT Left: negative  Dynamic Visual Acuity: Static: Line 6 Dynamic: Line 8 Pt did not wear his glasses, but could've read a farther line Felt discombobulated afterwards    POSITIONAL TESTING: Right Roll Test: no nystagmus Left Roll Test: no nystagmus Right Sidelying: no nystagmus and feels a little discombobulated  Left Sidelying: no nystagmus  Pt feeling a little discombobulated coming to upright.     M-CTSIB  Condition 1: Firm Surface, EO 30 Sec, Normal Sway  Condition 2: Firm Surface, EC 30 Sec, Mild Sway  Condition 3: Foam Surface, EO 30 Sec, Mild/Mod Sway  Condition 4: Foam Surface, EC 30 Sec, Mild/Mod Sway      VESTIBULAR TREATMENT:                                                                                                   N/A during eval.   PATIENT EDUCATION: Education details: Clinical findings, POC, 3 systems for balance, possible benefit of following up with ENT regarding fullness/sinus pressure.  Person educated: Patient Education method:  Explanation Education comprehension: verbalized understanding  HOME EXERCISE PROGRAM: Will provide at future session.   GOALS: Goals reviewed with patient? Yes  SHORT TERM GOALS:   LONG TERM GOALS: Target date: 01/01/2023  Pt will be independent with final HEP for balance/vestibular deficits in order to build upon functional gains made in therapy. Baseline:  Goal status: INITIAL  2.  FGA to be assessed with goal written.  Baseline:  Goal status: INITIAL  3.  SOT to be assessed with goal written.  Baseline:  Goal status: INITIAL  4.  Pt will demo a 1 line or less difference on DVA in order to demo improved gaze stabilization.  Baseline: 2 line difference -could've been greater difference, but pt not wearing glasses today and pt feeling discombobulated afterwards Goal status: INITIAL   ASSESSMENT:  CLINICAL IMPRESSION: Patient is a 67 year old female referred to Neuro OPPT for Dizziness. Pt mainly describes dizziness as feeling off balanced and discombobulated. Pt also reports feeling that head feels congested. Pt may benefit from seeing an ENT regarding this. Pt's PMH is significant for: Type 2 diabetes, HLD, HTN. The following deficits were present during the exam: impaired balance, slight positive HIT to the R, 2 line difference on DVA and pt feeling discombobulated. Pt unsteady with conditions 2-4 with mCTSIB, but able to hold for 30 seconds. Pt with incr visual input for her balance. Pt with no dizziness/nystagmus with positional testing, but did feel discombobulated in R sidelying and when coming to upright. Will perform further balance testing at next session. Pt would benefit from skilled PT to address these impairments and functional limitations to maximize functional mobility independence and decr dizziness.    OBJECTIVE IMPAIRMENTS: decreased activity tolerance, decreased balance, and dizziness.   ACTIVITY LIMITATIONS: locomotion level  PARTICIPATION LIMITATIONS:  community activity  PERSONAL FACTORS: Age,  Behavior pattern, Past/current experiences, Time since onset of injury/illness/exacerbation, and 3+ comorbidities: Type 2 diabetes, HLD, HTN  are also affecting patient's functional outcome.   REHAB POTENTIAL: Good  CLINICAL DECISION MAKING: Stable/uncomplicated  EVALUATION COMPLEXITY: Low   PLAN:  PT FREQUENCY: 2x/week  PT DURATION: 4 weeks  PLANNED INTERVENTIONS: Therapeutic exercises, Therapeutic activity, Neuromuscular re-education, Balance training, Gait training, Patient/Family education, Self Care, Vestibular training, Manual therapy, and Re-evaluation  PLAN FOR NEXT SESSION: Perform SOT and FGA and write goals. Initial HEP based on findings and balance with EC, VOR    Esperanza Heir, PT, DPT  12/08/2022, 3:18 PM

## 2022-12-08 NOTE — Telephone Encounter (Signed)
Good afternoon,  Tuyen Dooner was seen for a vestibular physical appointment today.  The patient presented with an irregular HR rhythm and BP of 150/40 when assessed manually. The HR was mixed with both faint and strong beats and unable to get an accurate read with manual but was < 60 bpm. Patient subjective report today not extremely consistent with a typical vestibular presentation. I recommended she call and follow up with you both. If possible, I would recommend further cardiac workup before continuing physical therapy if you consider appropriate.  Thank you, Malachi Carl, PT, [facility] [address] [place], [place]  [address] Phone:  [phone] Fax:  [phone]

## 2022-12-09 NOTE — Telephone Encounter (Signed)
Left the pt a message to call the office back to discuss recommendations for 7 day zio monitor, per Dr. Harrington Challenger, as indicated in this message.

## 2022-12-10 ENCOUNTER — Ambulatory Visit: Payer: HMO | Attending: Internal Medicine

## 2022-12-10 DIAGNOSIS — I493 Ventricular premature depolarization: Secondary | ICD-10-CM

## 2022-12-10 NOTE — Telephone Encounter (Signed)
Pt advised and verbalized understanding of her 7 Day Zio.... I cancelled her APP appt 12/17/22 and she will see Dr Harrington Challenger 01/04/23. She will call if she develops any problems prior to her appt.

## 2022-12-10 NOTE — Progress Notes (Unsigned)
Enrolled patient for a 7 day Zio XT monitor to be mailed to patients home.  

## 2022-12-10 NOTE — Addendum Note (Signed)
Addended by: Stephani Police on: 12/10/2022 10:12 AM   Modules accepted: Orders

## 2022-12-11 ENCOUNTER — Encounter: Payer: HMO | Admitting: Physical Therapy

## 2022-12-14 DIAGNOSIS — I493 Ventricular premature depolarization: Secondary | ICD-10-CM | POA: Diagnosis not present

## 2022-12-15 ENCOUNTER — Ambulatory Visit: Payer: HMO | Admitting: Physical Therapy

## 2022-12-17 ENCOUNTER — Ambulatory Visit: Payer: HMO | Admitting: Physician Assistant

## 2022-12-18 ENCOUNTER — Ambulatory Visit: Payer: HMO | Admitting: Physical Therapy

## 2022-12-21 ENCOUNTER — Ambulatory Visit (INDEPENDENT_AMBULATORY_CARE_PROVIDER_SITE_OTHER): Payer: HMO | Admitting: Student

## 2022-12-21 ENCOUNTER — Encounter: Payer: Self-pay | Admitting: Student

## 2022-12-21 VITALS — BP 134/60 | HR 80 | Ht 65.0 in | Wt 200.4 lb

## 2022-12-21 DIAGNOSIS — M542 Cervicalgia: Secondary | ICD-10-CM | POA: Insufficient documentation

## 2022-12-21 DIAGNOSIS — I10 Essential (primary) hypertension: Secondary | ICD-10-CM | POA: Diagnosis not present

## 2022-12-21 MED ORDER — CYCLOBENZAPRINE HCL 5 MG PO TABS: 5.0000 mg | ORAL_TABLET | Freq: Every day | ORAL | 0 refills | Status: DC

## 2022-12-21 MED ORDER — IBUPROFEN 800 MG PO TABS: 800.0000 mg | ORAL_TABLET | Freq: Three times a day (TID) | ORAL | 0 refills | Status: DC | PRN

## 2022-12-21 NOTE — Assessment & Plan Note (Signed)
Well-controlled. Amlodipine daily. Not compliant with losartan.

## 2022-12-21 NOTE — Patient Instructions (Signed)
It was great to see you! Thank you for allowing me to participate in your care!   I recommend that you always bring your medications to each appointment as this makes it easy to ensure we are on the correct medications and helps [place] not miss when refills are needed.  Our plans for today:  - Take 800 mg ibuprofen every 8 hours for 1 week - Take '5mg'$  of flexeril at bedtime only for the next 5 days - Continue ice and heat - I recommend no heavy lifting for the next week  We are checking some labs today, I will call you if they are abnormal will send you a MyChart message or a letter if they are normal.  If you do not hear about your labs in the next 2 weeks please let us know.  Take care and seek immediate care sooner if you develop any concerns. Please remember to show up 15 minutes before your scheduled appointment time!  Leslie Dales, DO [facility]

## 2022-12-21 NOTE — Progress Notes (Signed)
    SUBJECTIVE:   CHIEF COMPLAINT / HPI:   Neck pain Started on Thursday, no known trauma.  Was having spasms, which are improving.  Right neck and shoulder pain without radiation.  No neurologic symptoms, no unexplained weight loss (on Ozempic), no fever/chills, not immunosuppressed, no history of cancer.  History of cervical arthritis.  Taking Aleve and Tylenol (mild relief) and using heat/ice.  Noticed improvement now that she is restricting activity. Difficulty sleeping 2/2 spasms.  HTN Patient reports not taking losartan most days. BP 134/60 today. Patient reports home systolics between 443-154 and diastolics between 00-86. Renal protective in diabetes. Patient interested in single pill- reassess for combo pill or possible losartan monotherapy. Was having dizziness episodes, previous hx of PVC's, currently on Zio patch which comes off today- has f/u with cards.  PERTINENT  PMH / PSH: Cervical Osteoarthritis, HTN,   OBJECTIVE:   BP 134/60   Pulse 80   Ht 5\' 5"  (1.651 m)   Wt 200 lb 6 oz (90.9 kg)   SpO2 99%   BMI 33.34 kg/m    General: NAD, pleasant HEENT: Normocephalic, atraumatic head. Cardio: RRR, no MRG. Cap Refill <2s. Respiratory: CTAB, normal wob on RA MSK: Mild laterocollis to right. Hypertonicity and tenderness to cervical and upper back muscles on right. 5/5 BL UE str, normal sensation. Negative Spurling. No boney tenderness, no step offs. Restricted AROM with rotation toward right and left 2/2 pain. Skin: Warm and dry  ASSESSMENT/PLAN:   Essential hypertension Well-controlled. Amlodipine daily. Not compliant with losartan.  Acute neck pain Acute neck pain (5 days). No red flags identified today. Physical exam suggestive of cervical strain (tenderness and hypertonicity over neck and shoulder muscles on right, with intermittent muscle spasms). Differential includes cervical osteoarthritis (hx of osteoarthritis), cervical dystonia (Mild laterocollis to right),  cervical myofasial pain. -800 mg ibuprofen q8h for 1 week. -5 mg flexeril at bedtime (Discussed side-effects extensively) -Light duty at work for 1 week -Follow-up in 1 to 2 weeks to discuss resolution of neck pain  Follow-up recommendations Due for Medicare Annual Wellness Due for Zoster and Pneumonia vaccinations Reassess neck pain Not taking her losartan with normotensive pressures. Patient has diabetes and would benefit from renal protection. She is interested in single agent- consider combination pill or losartan monotherapy if appropriate.   Leslie Dales, [place]

## 2022-12-21 NOTE — Assessment & Plan Note (Addendum)
Acute neck pain (5 days). No red flags identified today. Physical exam suggestive of cervical strain (tenderness and hypertonicity over neck and shoulder muscles on right, with intermittent muscle spasms). Differential includes cervical osteoarthritis (hx of osteoarthritis), cervical dystonia (Mild laterocollis to right), cervical myofasial pain. -800 mg ibuprofen q8h for 1 week. -5 mg flexeril at bedtime (Discussed side-effects extensively) -Light duty at work for 1 week -Follow-up in 1 to 2 weeks to discuss resolution of neck pain

## 2022-12-22 ENCOUNTER — Ambulatory Visit: Payer: HMO | Admitting: Physical Therapy

## 2022-12-22 ENCOUNTER — Telehealth: Payer: Self-pay

## 2022-12-22 DIAGNOSIS — E119 Type 2 diabetes mellitus without complications: Secondary | ICD-10-CM

## 2022-12-22 NOTE — Telephone Encounter (Signed)
Patient calls nurse line regarding concern for red place in right eye. States that this is the second spot she has seen in a two month period. She states that "spot" was not present yesterday. Denies blurry vision or floaters. Patient reports that she saw Dr. Gershon Crane for this concern in the past and did not see anything alarming. They report this could happen for multiple reasons, including uncontrolled diabetes or blood pressure.   She is concerned that she does not have a way of checking blood sugar. She is asking if she would qualify for CGM. She states that if she does not qualify for CGM, she would like prescription for glucometer and supplies.   She also reports continued neck pain. States that flexeril is not helping much at all. She is asking if she could be prescribed anything stronger.   Will forward to PCP and pharmacy team. Also forwarding to Dr. Nelda Bucks as he saw patient yesterday for neck pain.   Talbot Grumbling, RN

## 2022-12-25 ENCOUNTER — Ambulatory Visit: Payer: HMO | Admitting: Physical Therapy

## 2022-12-28 MED ORDER — ONETOUCH VERIO W/DEVICE KIT: PACK | 1 refills | Status: AC

## 2022-12-28 NOTE — Telephone Encounter (Signed)
Spoke with patient by phone, confirmed identity.  Patient reports resolved neck pain, completed Flexeril.  No need for additional prescription at this time.  Placed order for One Touch Verio glucometer.

## 2022-12-29 ENCOUNTER — Encounter: Payer: HMO | Admitting: Physical Therapy

## 2022-12-29 DIAGNOSIS — I493 Ventricular premature depolarization: Secondary | ICD-10-CM | POA: Diagnosis not present

## 2023-01-01 ENCOUNTER — Encounter: Payer: HMO | Admitting: Physical Therapy

## 2023-01-03 NOTE — Progress Notes (Unsigned)
Cardiology Office Note   Date:  01/04/2023   ID:  Rachael Garcia, DOB 1956-06-02, MRN [id number]  PCP:  Eppie Gibson, MD  Cardiologist:   Dorris Carnes, MD   Pt returns for follow up of PVCs      History of Present Illness: Rachael Garcia is a 67 y.o. female with a history of HTN, HL, DM   She is followed by Rachael Garcia    EKG in office showed PVCs  Referred to cardiology     She was seen in march 2023   Echo showed normal LVEF and RVEF Monitor in March 2023 showed SR   Frequent PVCs ( 20% total beats)  I recomm adding metoprolol 25 mg bid  and follow up monitor in 3 months   The pt did not want to do this    Wanted to see if change in diet would help      The pt wore a second monitor this past month   It showed SR  Frequent PVCs 17.5% total   She had 11 beat NSVT  The pt notes occasional palpitations Brief   Breathing is OK  No CP    She is active walking at work  Some days 10000 steps  She is under increased stress  Mother older, in assisted living   Diet   Breakfast   Eggs   Hashbrown   Oatmeal/fruit Lunch  Salad  Leftovers      Greens daily      Veggies    Water   Coffee     1 glass wine   I saw the pt in March 2023   Pt notes occasional dizziness,   Not sure if allergies or heart     Will feel occasional palpitations   Not associated with dizzines Active    No CP    No PND     Current Meds  Medication Sig   amLODipine (NORVASC) 10 MG tablet Take 1 tablet (10 mg total) by mouth daily.   Ascorbic Acid (VITAMIN C GUMMIES PO) Take by mouth daily.   atorvastatin (LIPITOR) 40 MG tablet Take 1 tablet (40 mg total) by mouth daily.   Cholecalciferol (VITAMIN D PO) Take 5,000 Units by mouth.   cyclobenzaprine (FLEXERIL) 5 MG tablet Take 1 tablet (5 mg total) by mouth at bedtime.   fluticasone (FLONASE) 50 MCG/ACT nasal spray Place 2 sprays into both nostrils daily. Client states she takes as needed.   ibuprofen (ADVIL) 800 MG tablet Take 1 tablet (800 mg total) by mouth  every 8 (eight) hours as needed.   meloxicam (MOBIC) 15 MG tablet Take 1 tablet (15 mg total) by mouth daily.   Semaglutide,0.25 or 0.5MG /DOS, (OZEMPIC, 0.25 OR 0.5 MG/DOSE,) 2 MG/1.5ML SOPN Inject 0.5 mg into the skin once a week.   zinc gluconate 50 MG tablet Take 50 mg by mouth daily. Client states she takes 1/2 tablet.     Allergies:   Codeine, Erythromycin, and Sulfonamide derivatives   Past Medical History:  Diagnosis Date   Allergy    Anemia    Anniversary reaction 12/14/2019   Anxiety    Arthritis    Blood dyscrasia    sickle cell trait   Complication of anesthesia    o2 sat drop with epidural for  c-section surgery ;  subsequent surgeries no problems   Diabetes ([place])    TYPE 2 ( 3 YEARS AGO)   Diabetes mellitus without complication ([place])  06-2016 had hga1c of 7 prescribed metformin; returned for repeat a1c that was a 5.? ; does not take any diabetes medication currently    Fibroids    uterine   GERD (gastroesophageal reflux disease)    Heart murmur    "i've always had this"    Hyperlipidemia    Hypertension    IBS (irritable bowel syndrome)    Irritable bowel syndrome 08/13/2010   Qualifier: Diagnosis of  By: Nils Pyle CMA (AAMA), Leisha     Kidney stone    Lichen planus    Shoulder pain, right 01/11/2020   Upper airway cough syndrome 06/01/2017   Max rx for gerd plus 1st gen h1  06/01/2017 >>>  - Allergy profile 06/01/2017 >  Eos 0.2 /  IgE  64 PAST pos cat grass tree     Past Surgical History:  Procedure Laterality Date   CARPAL TUNNEL RELEASE Right 03   CARPAL TUNNEL RELEASE  08/20/2011   Procedure: CARPAL TUNNEL RELEASE;  Surgeon: Cammie Sickle., MD;  Location: [facility];  Service: Orthopedics;  Laterality: Left;   CESAREAN SECTION  83,92   x 2   COLONOSCOPY     DIAGNOSTIC LAPAROSCOPY     ovarian cyst removal   DILATION AND CURETTAGE OF UTERUS     MASS EXCISION Right 07/03/2015   Procedure: EXCISION RIGHT LONG FINGER MASS;  Surgeon: Charlotte Crumb, MD;  Location: [facility];  Service: Orthopedics;  Laterality: Right;   ROBOTIC ASSISTED BILATERAL SALPINGO OOPHERECTOMY Bilateral 06/29/2017   Procedure: XI ROBOTIC ASSISTED BILATERAL SALPINGO OOPHORECTOMY WITH PERITONEAL WASHINGS;  Surgeon: Everitt Amber, MD;  Location: [facility];  Service: Gynecology;  Laterality: Bilateral;   TUBAL LIGATION       Social History:  The patient  reports that she quit smoking about 11 years ago. Her smoking use included cigarettes. She has been exposed to tobacco smoke. She has never used smokeless tobacco. She reports current alcohol use of about 3.0 standard drinks of alcohol per week. She reports that she does not use drugs.   Family History:  The patient's family history includes Alcohol abuse in her father; Anemia in her mother; Arthritis in her mother; Cancer in her brother; Diabetes in her brother and sister; Heart disease (age of onset: 25) in her father; Hyperlipidemia in her father; Hypertension in her father and mother; Multiple myeloma in her maternal grandmother; Stomach cancer in her paternal grandfather; Stroke in her sister.    ROS:  Please see the history of present illness. All other systems are reviewed and  Negative to the above problem except as noted.    PHYSICAL EXAM: VS:  BP (!) 142/84   Pulse 90   Ht 5' 5.5" (1.664 m)   Wt 201 lb 3.2 oz (91.3 kg)   SpO2 97%   BMI 32.97 kg/m   GEN: Obese 67 yo in no acute distress  HEENT: normal  Neck: no JVD, carotid bruit Cardiac: RRR; no murmur  No LE  edema  Respiratory:  clear to auscultation bilaterall  GI: soft, nontender  No hepatomegaly   EKG:  EKG shows   SR 90  Frequen PVCs, PAC  Lipid Panel    Component Value Date/Time   CHOL 257 (H) 12/03/2021 1657   TRIG 125 12/03/2021 1657   HDL 69 12/03/2021 1657   CHOLHDL 3.7 12/03/2021 1657   CHOLHDL 4.0 10/18/2014 1607   VLDL 18 10/18/2014 1607   LDLCALC 166 (H) 12/03/2021 1657  Wt Readings from Last 3  Encounters:  01/04/23 201 lb 3.2 oz (91.3 kg)  12/21/22 200 lb 6 oz (90.9 kg)  11/30/22 199 lb (90.3 kg)      ASSESSMENT AND PLAN:  1  PVCs  Remain frequent   Reviewed with pt    Concern that that high frequency could lead to LV dysfunction No symptoms of angina     Have reviewed with EP   They would recomm Toprol XL as well   Will contact patient     Repeat monitor in 4 months   (2 Day)  2  HTN  BP is not optimal  She says it is 130s to 140s/ at home   Again, B Blocker would help        3  Lipids  Will get lipomed   4  Diet    Discussed low carb last [name] was 6.3       Current medicines are reviewed at length with the patient today.  The patient does not have concerns regarding medicines.  Signed, Dorris Carnes, MD  01/04/2023 5:33 PM    [address], [place], [place]  [address] Phone: [phone]; Fax: [phone]

## 2023-01-04 ENCOUNTER — Ambulatory Visit: Payer: PPO | Attending: Internal Medicine | Admitting: Internal Medicine

## 2023-01-04 ENCOUNTER — Encounter: Payer: Self-pay | Admitting: Internal Medicine

## 2023-01-04 VITALS — BP 142/84 | HR 90 | Ht 65.5 in | Wt 201.2 lb

## 2023-01-04 DIAGNOSIS — E785 Hyperlipidemia, unspecified: Secondary | ICD-10-CM

## 2023-01-04 NOTE — Progress Notes (Signed)
Pt decided not to wait for her NMR today and says she will drop in another day when she has more time.

## 2023-01-04 NOTE — Patient Instructions (Signed)
Medication Instructions:   *If you need a refill on your cardiac medications before your next appointment, please call your pharmacy*   Lab Work: NMR  If you have labs (blood work) drawn today and your tests are completely normal, you will receive your results only by: [place] (if you have MyChart) OR A paper copy in the mail If you have any lab test that is abnormal or we need to change your treatment, we will call you to review the results.   Testing/Procedures:    Follow-Up: At [facility], you and your health needs are our priority.  As part of our continuing mission to provide you with exceptional heart care, we have created designated Provider Care Teams.  These Care Teams include your primary Cardiologist (physician) and Advanced Practice Providers (APPs -  Physician Assistants and Nurse Practitioners) who all work together to provide you with the care you need, when you need it.  We recommend signing up for the patient portal called "MyChart".  Sign up information is provided on this After Visit Summary.  MyChart is used to connect with patients for Virtual Visits (Telemedicine).  Patients are able to view lab/test results, encounter notes, upcoming appointments, etc.  Non-urgent messages can be sent to your provider as well.   To learn more about what you can do with MyChart, go to [email or web address].

## 2023-01-06 ENCOUNTER — Ambulatory Visit: Payer: PPO | Attending: Internal Medicine

## 2023-01-06 ENCOUNTER — Telehealth: Payer: Self-pay | Admitting: *Deleted

## 2023-01-06 DIAGNOSIS — R002 Palpitations: Secondary | ICD-10-CM

## 2023-01-06 DIAGNOSIS — I493 Ventricular premature depolarization: Secondary | ICD-10-CM

## 2023-01-06 MED ORDER — METOPROLOL SUCCINATE ER 25 MG PO TB24: 25.0000 mg | ORAL_TABLET | Freq: Every day | ORAL | 3 refills | Status: DC

## 2023-01-06 NOTE — Telephone Encounter (Signed)
Dr Harrington Challenger would like for you to be started on Metoprolol Succinate 25 mg a day to help suppress the number of skips your heart is having.  Please let us know which pharmacy you would like this prescription sent to.   Fay Records, MD 01/06/2023  8:10 AM EDT Reviewed with patient at clinic visit I have also reviewed this monitor and previous monitor results with electrical cardiologists Would recommend Toprol XL 25 mg    THe heart muscle does not do as well when having so many skips   Goal to suppress the PVCs Would then recomm follow up monitor in about 3 to 4 months

## 2023-01-06 NOTE — Progress Notes (Unsigned)
Enrolled for [employer] to mail a ZIO XT long term holter monitor to the patients address on file on 04/06/2023.

## 2023-01-11 ENCOUNTER — Telehealth: Payer: Self-pay

## 2023-01-11 NOTE — Telephone Encounter (Signed)
Patient calls nurse line requesting prescription for Freestyle Libre CGM. She reports speaking with representative from [employer], that informed her that this would be covered under her plan.   [facility] [address].   Talbot Grumbling, RN

## 2023-01-12 ENCOUNTER — Telehealth: Payer: Self-pay

## 2023-01-12 ENCOUNTER — Ambulatory Visit: Payer: PPO | Attending: Internal Medicine

## 2023-01-12 DIAGNOSIS — I493 Ventricular premature depolarization: Secondary | ICD-10-CM

## 2023-01-12 MED ORDER — FREESTYLE LIBRE SENSOR SYSTEM MISC: 1.0000 | Freq: Once | 0 refills | Status: DC

## 2023-01-12 NOTE — Telephone Encounter (Signed)
The patient has been notified of the result and verbalized understanding.  All questions (if any) were answered.  Zio ordered.

## 2023-01-12 NOTE — Progress Notes (Unsigned)
Enrolled for [employer] to mail a ZIO XT long term holter monitor 05/13/2023 to the patients address on file.

## 2023-01-12 NOTE — Telephone Encounter (Signed)
-----   Message from Fay Records, MD sent at 01/04/2023  9:22 PM EDT ----- Reviewed with EP    WOuld recomm Toprol XL 25 mg      Monitor (2D) in 4 months

## 2023-01-12 NOTE — Telephone Encounter (Signed)
Called patient and provided with update. Patient appreciative.   Lucill Mauck C Bethenny Losee, RN  

## 2023-01-27 ENCOUNTER — Other Ambulatory Visit: Payer: Self-pay

## 2023-01-27 ENCOUNTER — Ambulatory Visit
Admission: RE | Admit: 2023-01-27 | Discharge: 2023-01-27 | Disposition: A | Discharge status: Home / Self Care | Payer: Self-pay | Source: Ambulatory Visit | Attending: Family Medicine | Admitting: Family Medicine

## 2023-01-27 ENCOUNTER — Ambulatory Visit (INDEPENDENT_AMBULATORY_CARE_PROVIDER_SITE_OTHER): Payer: PPO | Admitting: Student

## 2023-01-27 VITALS — BP 158/80 | HR 80 | Ht 65.5 in | Wt 199.8 lb

## 2023-01-27 DIAGNOSIS — F4322 Adjustment disorder with anxiety: Secondary | ICD-10-CM | POA: Insufficient documentation

## 2023-01-27 DIAGNOSIS — R202 Paresthesia of skin: Secondary | ICD-10-CM | POA: Diagnosis not present

## 2023-01-27 DIAGNOSIS — M47812 Spondylosis without myelopathy or radiculopathy, cervical region: Secondary | ICD-10-CM | POA: Diagnosis not present

## 2023-01-27 DIAGNOSIS — R200 Anesthesia of skin: Secondary | ICD-10-CM

## 2023-01-27 DIAGNOSIS — M2578 Osteophyte, vertebrae: Secondary | ICD-10-CM | POA: Diagnosis not present
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
## 2023-01-27 NOTE — Patient Instructions (Addendum)
It was great to see you! Thank you for allowing me to participate in your care!  I recommend that you always bring your medications to each appointment as this makes it easy to ensure we are on the correct medications and helps [place] not miss when refills are needed.  Our plans for today:  - Therapy  Will start w/ Therapy/counseling today  Medication   Will consider starting a medication in future, IF symptoms not improved w/ therapy   - Arm tingling/numbness  Will obtain x-ray of neck to rule out issues w/ your spinal cord  Take care and seek immediate care sooner if you develop any concerns.   Dr. Bess Kinds, MD [facility]   For information on therapists, please go to [email or web address]. You can also contact your insurance company to find an in-network therapist.    Therapy and Counseling Resources Most providers on this list will take Medicaid. Patients with commercial insurance or Medicare should contact their insurance company to get a list of in network providers.  [facility] (takes children) Location 1: [address], [address] [place], [place] [address] Location 2: [address] [place], [place] [address] [phone]   [facility] (spanish speaking therapist available)(habla espanol)(take medicare and medicaid)  [address], [place], [place] [address], [email or web address] [phone]  [facility]Psychiatry and Counseling [address] [place], [place] [address] [phone]  [facility]   [address] [place], [place] [address]      [phone]  [facility] (spanish available) [address]  [place], [place] [address] [phone]  [facility] (take [facility] and medicare) [address], [address]  [place], [place] [address]       [phone]     [facility] (virtual only) [phone]  [facility] [address] [place], [place] [phone]  [facility]:  [address] [place] [phone]  [facility]:  [address] Hessie Diener [phone]  [facility] (under & uninsured) [address]   [place] [phone]    [email or web address]    [facility] [address]  [place]    [phone]  [facility] -[address]     Phone:  [phone]     [facility]-  [address]  [phone]   [facility] [address]      [place], [place] [phone]  [facility]: [address] [place], [place], [place]  [phone]   [facility] (Spanish therapist) [email or web address]  [address] [place]  [address]   [place] [address]    [phone]    [facility]   [address],  [place], [place]  [phone]   [facility]  [address] [place]  [phone]  [facility]  [address] [place], [place]        [phone]  Open Access/Walk In Clinic under & uninsured  [facility]  [address] [place], [place] [phone] Crisis [phone]  [facility] [address],  (Spanish)   [address], [place]: [phone]) 8:30 - 12; 1 - 2:30  [facility] [employer],  [address] [place], [place]    [phone]):8:30 - 12; 2 - 3PM  [facility] [employer],  [address]  [place]; [phone]):   Mon - Fri 8 AM - 5 PM  [facility] [place]  MWF 12:30 to 3:00 or call to schedule an appointment  [phone]  Specific Provider options Psychology Today  [email or web address] click on find a therapist  enter your zip code left side and select or tailor a therapist for your specific need.   [facility] Provider  Directory [email or web address]  (Medicaid)   Follow all drop down to find a provider  [place] or [email or web address] [address], [place], [place] Recovery support and educational   24- Hour Availability:   [facility]  [address] [place], [place] Crisis [phone]  [facility] [employer] [phone]  [place]  [phone]   [place]  [phone] (after hours)  [facility]/Mobile Crisis   [phone]  [place] National Suicide Hotline  [phone] Diamantina Monks)  Call 911 or go to emergency room  [facility]  [phone]);  [place] and [employer]  [phone]); [place], [place], [place], [place], [place], [place], [place]

## 2023-01-27 NOTE — Progress Notes (Signed)
ANXIETY
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
## 2023-02-03 ENCOUNTER — Encounter: Payer: Self-pay | Admitting: Student

## 2023-02-03 IMAGING — MG MM DIGITAL DIAGNOSTIC UNILAT*R* W/ TOMO W/ CAD
4 series · 4 of 12 positions shown · non-contrast
Comparison: Previous exam(s).

CLINICAL DATA: Screening recall for a right breast asymmetry.

EXAM:
DIGITAL DIAGNOSTIC UNILATERAL RIGHT MAMMOGRAM WITH TOMOSYNTHESIS AND
CAD
TECHNIQUE: Right digital diagnostic mammography and breast tomosynthesis was
performed. The images were evaluated with computer-aided detection.

[R MLO synth-2D]
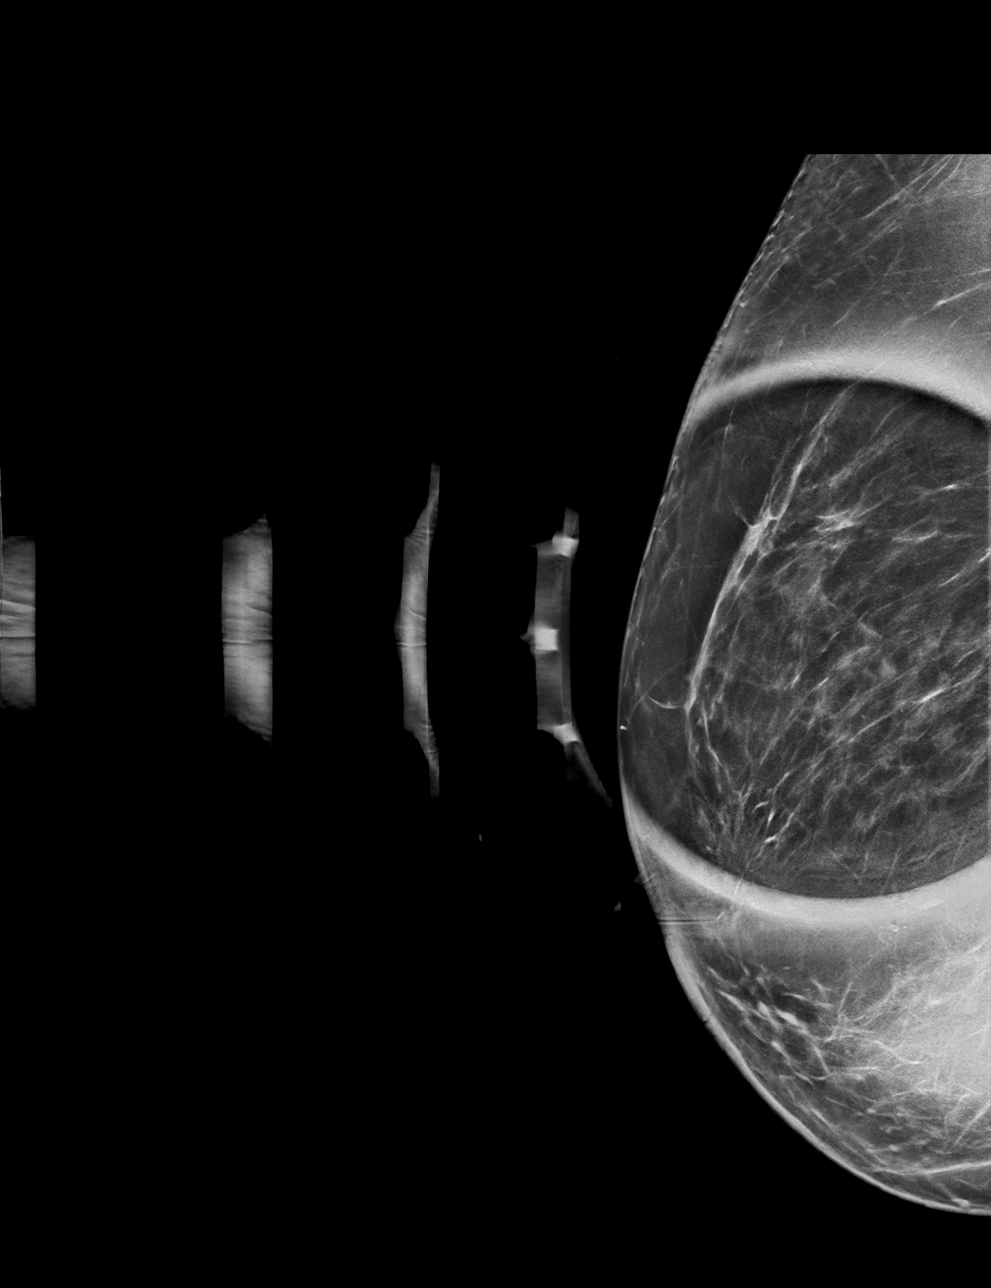

[R ML synth-2D]
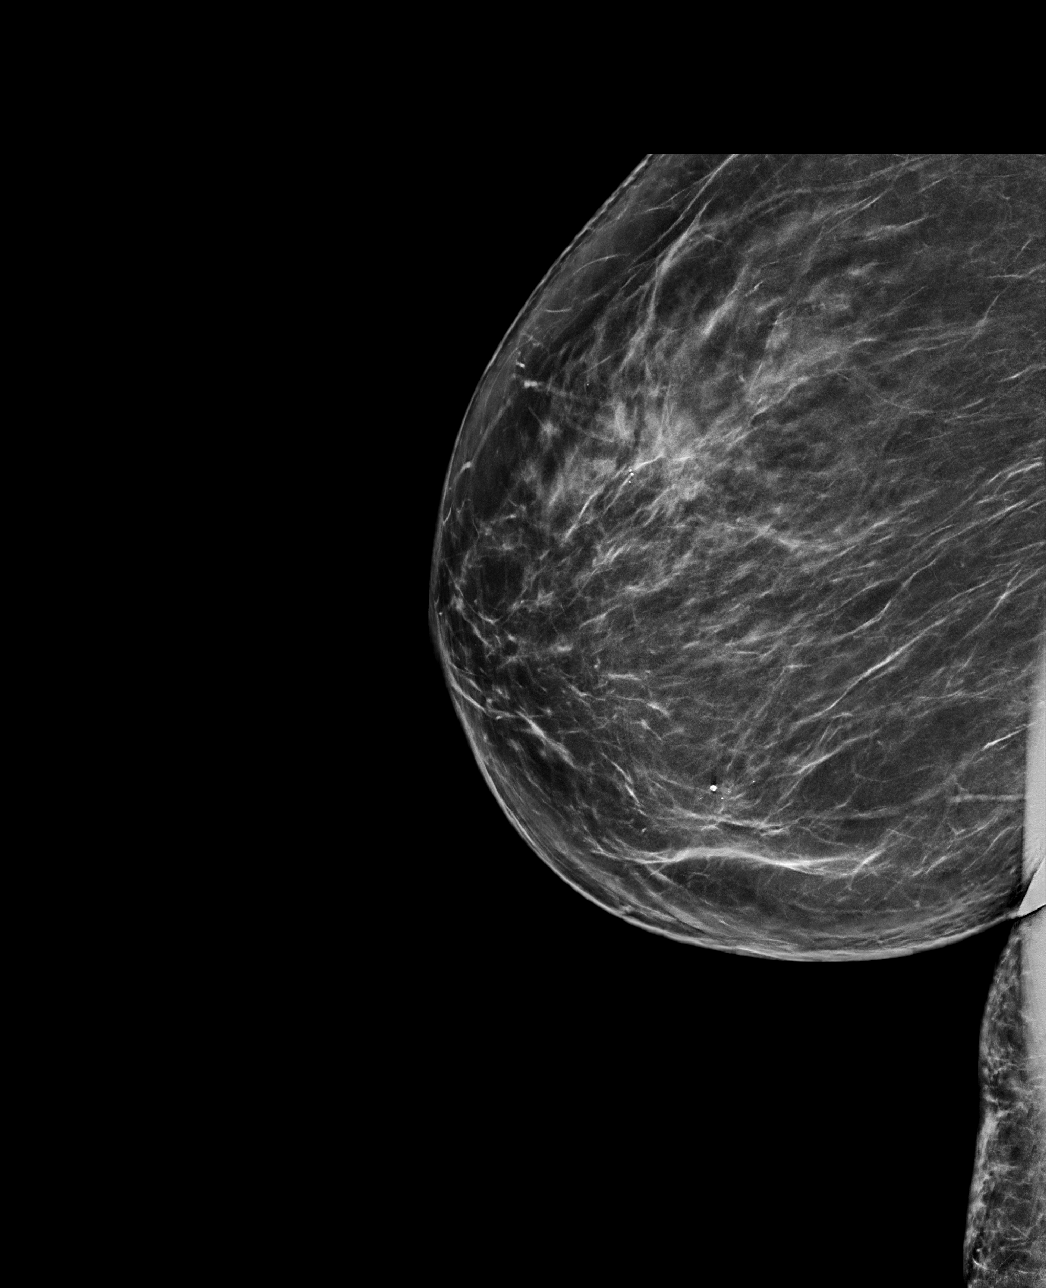

[R MLO tomo · tomo slice 39/76.0]
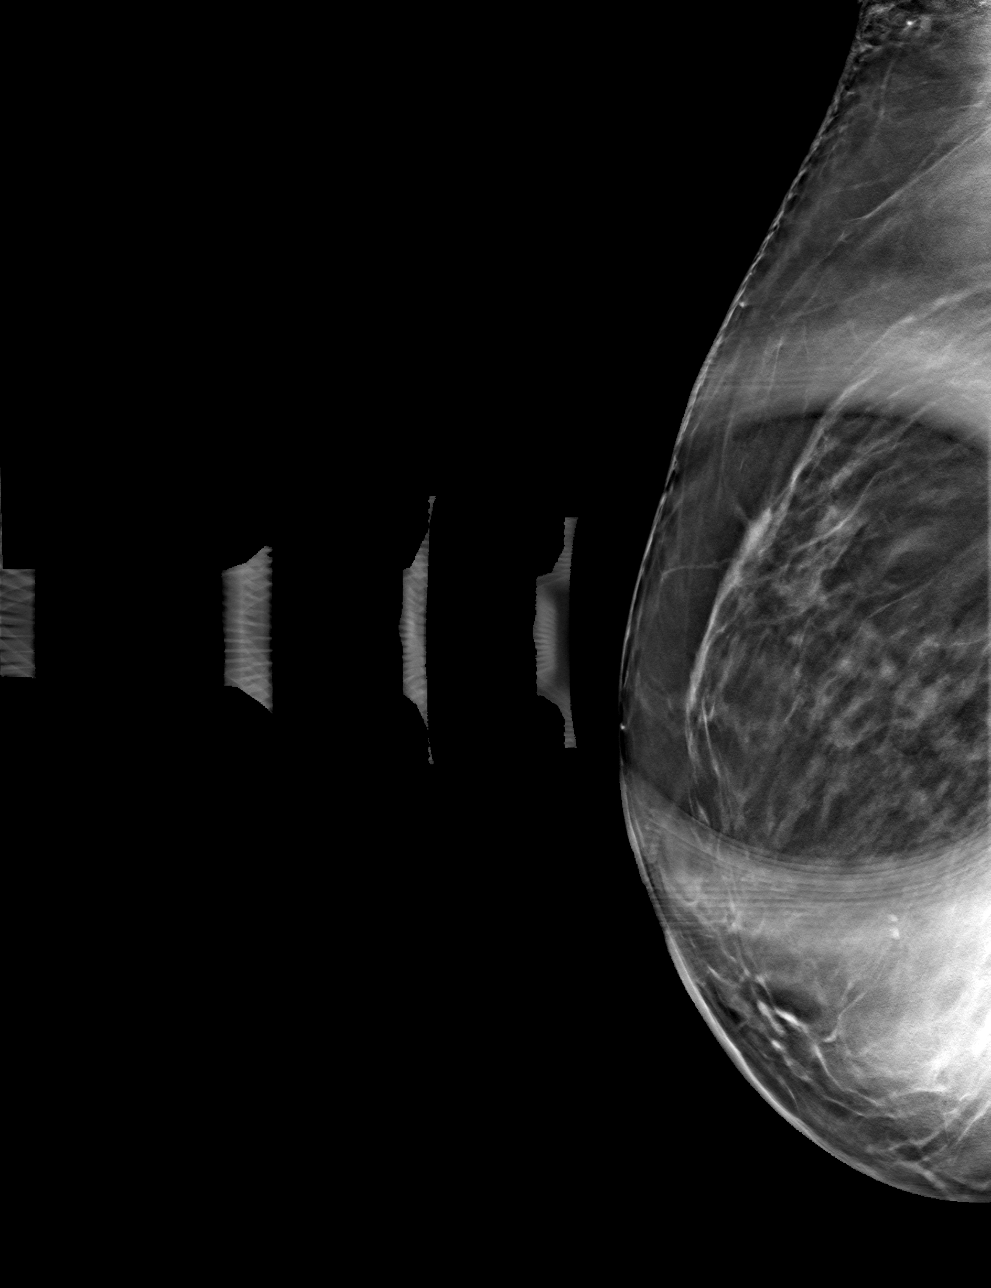

[R ML tomo · tomo slice 40/79.0]
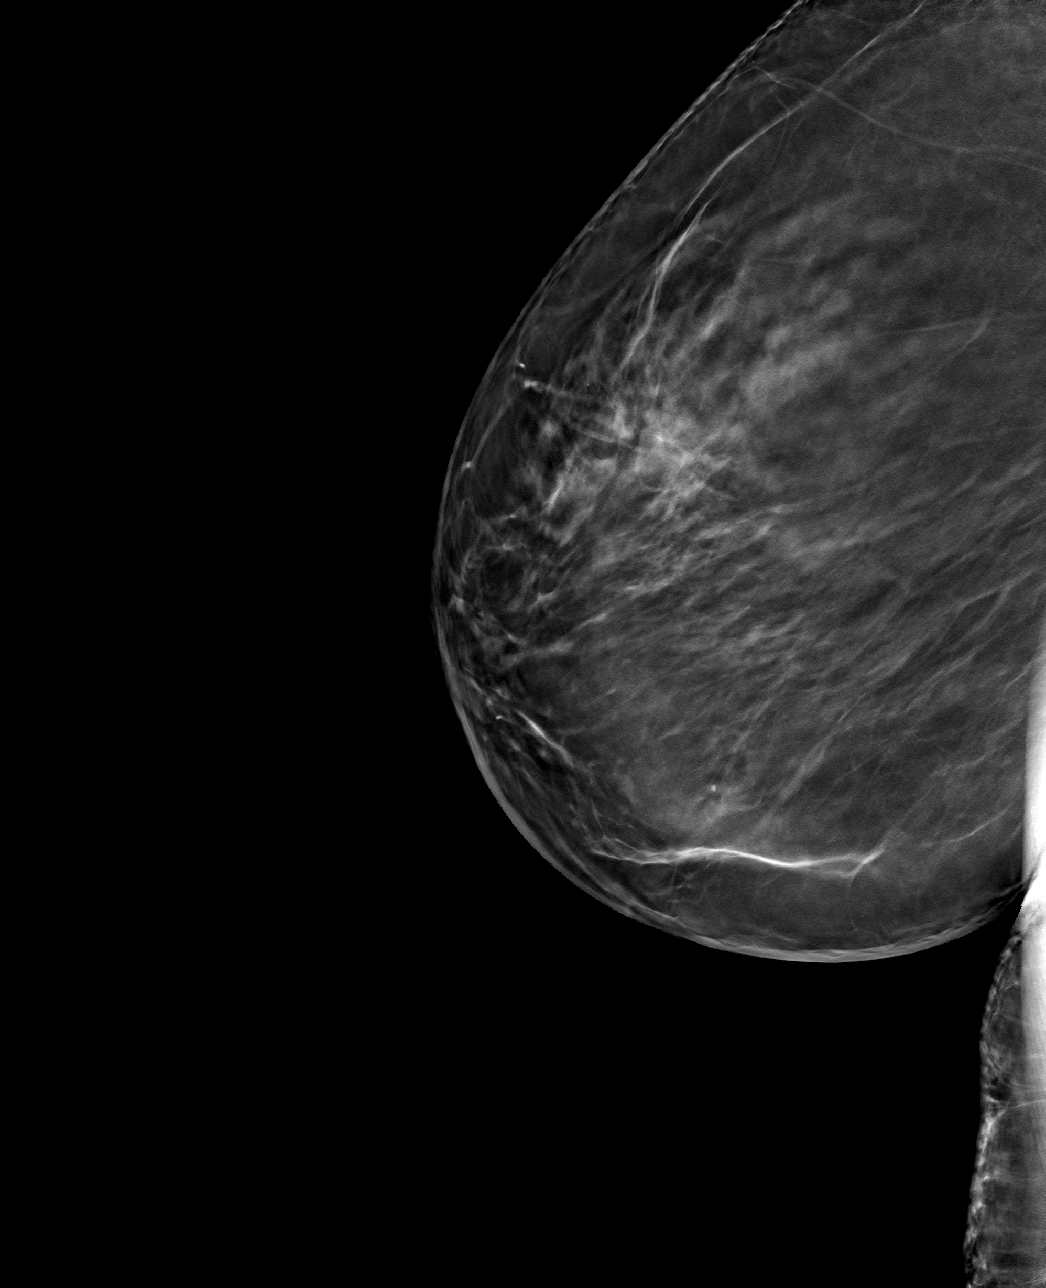

[4 of 12 positions shown; findings below may reference images not displayed]

ACR Breast Density Category b: There are scattered areas of
fibroglandular density.
FINDINGS: Spot compression tomosynthesis images through the superior right
breast demonstrates resolution of the asymmetry seen on the
screening mammogram. No suspicious calcifications, masses or areas
of distortion are seen in the right breast.
IMPRESSION: Resolution of the right breast asymmetry consistent with overlapping
fibroglandular tissue.

RECOMMENDATION:
Screening mammogram in one year.(Code:[id number]-X-B1Q)

I have discussed the findings and recommendations with the patient.
If applicable, a reminder letter will be sent to the patient
regarding the next appointment.

BI-RADS CATEGORY  1: Negative.

## 2023-02-24 ENCOUNTER — Telehealth: Payer: Self-pay | Admitting: Student

## 2023-02-24 NOTE — Telephone Encounter (Signed)
Contacted [name] to schedule their annual wellness visit. Appointment made for 02/26/2023.  Thank you,  [facility] Support [facility] Direct dial  [phone]

## 2023-02-26 ENCOUNTER — Ambulatory Visit (INDEPENDENT_AMBULATORY_CARE_PROVIDER_SITE_OTHER): Payer: Self-pay

## 2023-02-26 VITALS — Ht 65.5 in | Wt 199.0 lb

## 2023-02-26 DIAGNOSIS — Z1231 Encounter for screening mammogram for malignant neoplasm of breast: Secondary | ICD-10-CM

## 2023-02-26 DIAGNOSIS — Z0000 Encounter for general adult medical examination without abnormal findings: Secondary | ICD-10-CM

## 2023-02-28 NOTE — Progress Notes (Cosign Needed Addendum)
Subjective:   Rachael Garcia is a 67 y.o. female who presents for Medicare Annual (Subsequent) preventive examination.  I connected with  Rachael Garcia on 02/26/23 by a audio enabled telemedicine application and verified that I am speaking with the correct person using two identifiers.  Patient Location: Home  Provider Location: Home Office  I discussed the limitations of evaluation and management by telemedicine. The patient expressed understanding and agreed to proceed.  Review of Systems     Cardiac Risk Factors include: advanced age (>54men, >22 women);diabetes mellitus;dyslipidemia;hypertension     Objective:    Today's Vitals   02/26/23 1300  Weight: 199 lb (90.3 kg)  Height: 5' 5.5" (1.664 m)   Body mass index is 32.61 kg/m.     02/28/2023    7:38 PM 01/27/2023    9:46 AM 12/21/2022    2:18 PM 12/04/2022    2:48 PM 05/25/2022    3:40 PM 03/02/2022    2:56 PM 02/06/2022    2:56 PM  Advanced Directives  Does Patient Have a Medical Advance Directive? No No No No No No No  Would patient like information on creating a medical advance directive? Yes (MAU/Ambulatory/Procedural Areas - Information given) No - Patient declined No - Patient declined  No - Patient declined No - Patient declined No - Patient declined    Current Medications (verified) Outpatient Encounter Medications as of 02/26/2023  Medication Sig   amLODipine (NORVASC) 10 MG tablet Take 1 tablet (10 mg total) by mouth daily.   Ascorbic Acid (VITAMIN C GUMMIES PO) Take by mouth daily.   atorvastatin (LIPITOR) 40 MG tablet Take 1 tablet (40 mg total) by mouth daily.   blood glucose meter kit and supplies KIT Dispense based on patient and insurance preference. Use up to four times daily as directed.   Blood Glucose Monitoring Suppl (ONETOUCH VERIO) w/Device KIT Test daily as needed and when you feel your blood sugar is low.   Cholecalciferol (VITAMIN D PO) Take 5,000 Units by mouth.   cyclobenzaprine  (FLEXERIL) 5 MG tablet Take 1 tablet (5 mg total) by mouth at bedtime.   fluticasone (FLONASE) 50 MCG/ACT nasal spray Place 2 sprays into both nostrils daily. Client states she takes as needed.   ibuprofen (ADVIL) 800 MG tablet Take 1 tablet (800 mg total) by mouth every 8 (eight) hours as needed.   losartan (COZAAR) 25 MG tablet Take 1 tablet (25 mg total) by mouth at bedtime.   meloxicam (MOBIC) 15 MG tablet Take 1 tablet (15 mg total) by mouth daily.   metoprolol succinate (TOPROL-XL) 25 MG 24 hr tablet Take 1 tablet (25 mg total) by mouth daily.   Semaglutide,0.25 or 0.5MG /DOS, (OZEMPIC, 0.25 OR 0.5 MG/DOSE,) 2 MG/1.5ML SOPN Inject 0.5 mg into the skin once a week.   zinc gluconate 50 MG tablet Take 50 mg by mouth daily. Client states she takes 1/2 tablet.   No facility-administered encounter medications on file as of 02/26/2023.    Allergies (verified) Codeine, Erythromycin, and Sulfonamide derivatives   History: Past Medical History:  Diagnosis Date   Allergy    Anemia    Anniversary reaction 12/14/2019   Anxiety    Arthritis    Blood dyscrasia    sickle cell trait   Complication of anesthesia    o2 sat drop with epidural for  c-section surgery ;  subsequent surgeries no problems   Diabetes (HCC)    TYPE 2 ( 3 YEARS AGO)  Diabetes mellitus without complication (HCC)    06-2016 had hga1c of 7 prescribed metformin; returned for repeat a1c that was a 5.? ; does not take any diabetes medication currently    Fibroids    uterine   GERD (gastroesophageal reflux disease)    Heart murmur    "i've always had this"    Hyperlipidemia    Hypertension    IBS (irritable bowel syndrome)    Irritable bowel syndrome 08/13/2010   Qualifier: Diagnosis of  By: Koleen Distance CMA (AAMA), Leisha     Kidney stone    Lichen planus    Shoulder pain, right 01/11/2020   Upper airway cough syndrome 06/01/2017   Max rx for gerd plus 1st gen h1  06/01/2017 >>>  - Allergy profile 06/01/2017 >  Eos 0.2 /  IgE  64  PAST pos cat grass tree    Past Surgical History:  Procedure Laterality Date   CARPAL TUNNEL RELEASE Right 03   CARPAL TUNNEL RELEASE  08/20/2011   Procedure: CARPAL TUNNEL RELEASE;  Surgeon: Wyn Forster., MD;  Location: [facility];  Service: Orthopedics;  Laterality: Left;   CESAREAN SECTION  83,92   x 2   COLONOSCOPY     DIAGNOSTIC LAPAROSCOPY     ovarian cyst removal   DILATION AND CURETTAGE OF UTERUS     MASS EXCISION Right 07/03/2015   Procedure: EXCISION RIGHT LONG FINGER MASS;  Surgeon: Dairl Ponder, MD;  Location: [facility];  Service: Orthopedics;  Laterality: Right;   ROBOTIC ASSISTED BILATERAL SALPINGO OOPHERECTOMY Bilateral 06/29/2017   Procedure: XI ROBOTIC ASSISTED BILATERAL SALPINGO OOPHORECTOMY WITH PERITONEAL WASHINGS;  Surgeon: Adolphus Birchwood, MD;  Location: [facility];  Service: Gynecology;  Laterality: Bilateral;   TUBAL LIGATION     Family History  Problem Relation Age of Onset   Arthritis Mother    Anemia Mother    Hypertension Mother    Diabetes Sister    Hypertension Father    Alcohol abuse Father    Heart disease Father 72       defibrillator and pacemaker   Hyperlipidemia Father    Multiple myeloma Maternal Grandmother    Stomach cancer Paternal Grandfather    Stroke Sister    Diabetes Brother    Cancer Brother        Throat   Colon cancer Neg Hx    Social History   Socioeconomic History   Marital status: Media planner    Spouse name: Not on file   Number of children: 2   Years of education: 12   Highest education level: 12th grade  Occupational History   Occupation: Best boy: [employer]   Occupation: Retired  Tobacco Use   Smoking status: Former    Years: 8    Types: Cigarettes    Quit date: 07/13/2011    Years since quitting: 11.6    Passive exposure: Past   Smokeless tobacco: Never  Vaping Use   Vaping Use: Never used  Substance and Sexual  Activity   Alcohol use: Yes    Alcohol/week: 3.0 standard drinks of alcohol    Types: 3 Glasses of wine per week    Comment: OCCASIONALLY WINE   Drug use: No   Sexual activity: Not Currently    Birth control/protection: Post-menopausal    Comment: 1st intercourse 67 yo-More than 5 partners  Other Topics Concern   Not on file  Social History Narrative  Patient lives in [place] with her partner.    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    Patient enjoys dancing and walking.    Patient has 2 cats- [place] and [place].    Social Determinants of Health   Financial Resource Strain: Low Risk  (02/28/2023)   Overall Financial Resource Strain (CARDIA)    Difficulty of Paying Living Expenses: Not hard at all  Food Insecurity: No Food Insecurity (02/28/2023)   Hunger Vital Sign    Worried About Running Out of Food in the Last Year: Never true    Ran Out of Food in the Last Year: Never true  Transportation Needs: No Transportation Needs (02/28/2023)   PRAPARE - Administrator, Civil Service (Medical): No    Lack of Transportation (Non-Medical): No  Physical Activity: Insufficiently Active (02/28/2023)   Exercise Vital Sign    Days of Exercise per Week: 3 days    Minutes of Exercise per Session: 30 min  Stress: No Stress Concern Present (02/28/2023)   [employer] of Occupational Health - Occupational Stress Questionnaire    Feeling of Stress : Not at all  Social Connections: Socially Integrated (02/28/2023)   Social Connection and Isolation Panel [NHANES]    Frequency of Communication with Friends and Family: More than three times a week    Frequency of Social Gatherings with Friends and Family: Three times a week    Attends Religious Services: More than 4 times per year    Active Member of Clubs or Organizations: Yes    Attends Engineer, structural: More than 4 times per year    Marital Status: Living with partner    Tobacco  Counseling Counseling given: Not Answered   Clinical Intake:  Pre-visit preparation completed: Yes  Pain : No/denies pain  Diabetes: Yes CBG done?: No Did pt. bring in CBG monitor from home?: No  How often do you need to have someone help you when you read instructions, pamphlets, or other written materials from your doctor or pharmacy?: 1 - Never  Diabetic?Yes   Nutrition Risk Assessment:  Has the patient had any N/V/D within the last 2 months?  No  Does the patient have any non-healing wounds?  No  Has the patient had any unintentional weight loss or weight gain?  No   Diabetes:  Is the patient diabetic?  Yes  If diabetic, was a CBG obtained today?  No  Did the patient bring in their glucometer from home?  No  How often do you monitor your CBG's? daily.   Financial Strains and Diabetes Management:  Are you having any financial strains with the device, your supplies or your medication? No .  Does the patient want to be seen by Chronic Care Management for management of their diabetes?  No  Would the patient like to be referred to a Nutritionist or for Diabetic Management?  No   Diabetic Exams:  Diabetic Eye Exam: Overdue for diabetic eye exam. Pt has been advised about the importance in completing this exam. Patient advised to call and schedule an eye exam. Diabetic Foot Exam: Overdue, Pt has been advised about the importance in completing this exam. Pt is scheduled for diabetic foot exam on  .   Interpreter Needed?: No  Information entered by :: Kandis Fantasia LPN   Activities of Daily Living    02/28/2023    7:36 PM  In your present state of health, do you have any difficulty performing  the following activities:  Hearing? 0  Vision? 0  Difficulty concentrating or making decisions? 0  Walking or climbing stairs? 0  Dressing or bathing? 0  Doing errands, shopping? 0  Preparing Food and eating ? N  Using the Toilet? N  In the past six months, have you  accidently leaked urine? N  Do you have problems with loss of bowel control? N  Managing your Medications? N  Managing your Finances? N  Housekeeping or managing your Housekeeping? N    Patient Care Team: Alicia Amel, MD as PCP - General (Family Medicine)  Indicate any recent Medical Services you may have received from other than [facility] providers in the past year (date may be approximate).     Assessment:   This is a routine wellness examination for Rhyen.  Hearing/Vision screen Hearing Screening - Comments:: Denies hearing difficulties   Vision Screening - Comments:: No vision problems; will schedule routine diabetic eye exam soon    Dietary issues and exercise activities discussed: Current Exercise Habits: Home exercise routine, Type of exercise: walking, Time (Minutes): 30, Frequency (Times/Week): 3, Weekly Exercise (Minutes/Week): 90, Intensity: Mild   Goals Addressed             This Visit's Progress    Remain active and independent        Depression Screen    02/28/2023    7:50 PM 01/27/2023    9:46 AM 12/21/2022    2:19 PM 05/25/2022    3:41 PM 03/02/2022    2:56 PM 02/06/2022    2:55 PM 08/05/2021   10:57 AM  PHQ 2/9 Scores  PHQ - 2 Score 1 1 0 0 0 0 0  PHQ- 9 Score 4 4 2  0 3 2 1     Fall Risk    02/28/2023    7:49 PM 01/27/2023    9:46 AM 12/21/2022    2:19 PM 05/25/2022    3:41 PM 03/02/2022    2:56 PM  Fall Risk   Falls in the past year? 0 0 0 0 0  Number falls in past yr: 0 0 0 0 0  Injury with Fall? 0 0 0 0 0  Risk for fall due to : No Fall Risks   No Fall Risks   Follow up Falls prevention discussed;Education provided;Falls evaluation completed   Falls prevention discussed     FALL RISK PREVENTION PERTAINING TO THE HOME:  Any stairs in or around the home? No  If so, are there any without handrails? No  Home free of loose throw rugs in walkways, pet beds, electrical cords, etc? Yes  Adequate lighting in your home to reduce risk of falls? Yes    ASSISTIVE DEVICES UTILIZED TO PREVENT FALLS:  Life alert? No  Use of a cane, walker or w/c? No  Grab bars in the bathroom? Yes  Shower chair or bench in shower? No  Elevated toilet seat or a handicapped toilet? Yes   TIMED UP AND GO:  Was the test performed? No . Telephonic visit   Cognitive Function:        02/28/2023    7:37 PM 06/24/2021    2:48 PM  6CIT Screen  What Year? 0 points 0 points  What month? 0 points 0 points  What time? 0 points 0 points  Count back from 20 0 points 0 points  Months in reverse 0 points 0 points  Repeat phrase 0 points 0 points  Total Score 0 points 0 points  Immunizations Immunization History  Administered Date(s) Administered   Td 10/12/2002   Tdap 07/01/2015    TDAP status: Up to date  Pneumococcal vaccine status: Declined,  Education has been provided regarding the importance of this vaccine but patient still declined. Advised may receive this vaccine at local pharmacy or Health Dept. Aware to provide a copy of the vaccination record if obtained from local pharmacy or Health Dept. Verbalized acceptance and understanding.   Covid-19 vaccine status: Declined, Education has been provided regarding the importance of this vaccine but patient still declined. Advised may receive this vaccine at local pharmacy or Health Dept.or vaccine clinic. Aware to provide a copy of the vaccination record if obtained from local pharmacy or Health Dept. Verbalized acceptance and understanding.  Qualifies for Shingles Vaccine? Yes   Zostavax completed No   Shingrix Completed?: No.    Education has been provided regarding the importance of this vaccine. Patient has been advised to call insurance company to determine out of pocket expense if they have not yet received this vaccine. Advised may also receive vaccine at local pharmacy or Health Dept. Verbalized acceptance and understanding.  Screening Tests Health Maintenance  Topic Date Due   COVID-19  Vaccine (1) Never done   Pneumonia Vaccine 59+ Years old (1 of 2 - PCV) Never done   Zoster Vaccines- Shingrix (1 of 2) Never done   OPHTHALMOLOGY EXAM  03/05/2021   INFLUENZA VACCINE  05/13/2023   HEMOGLOBIN [name]  05/31/2023   Diabetic kidney evaluation - eGFR measurement  12/01/2023   Diabetic kidney evaluation - Urine ACR  12/01/2023   Medicare Annual Wellness (AWV)  02/26/2024   MAMMOGRAM  03/05/2024   COLONOSCOPY (Pts 45-71yrs Insurance coverage will need to be confirmed)  11/16/2024   DTaP/Tdap/Td (3 - Td or Tdap) 06/30/2025   DEXA SCAN  Completed   Hepatitis C Screening  Completed   HPV VACCINES  Aged Out   FOOT EXAM  Discontinued    Health Maintenance  Health Maintenance Due  Topic Date Due   COVID-19 Vaccine (1) Never done   Pneumonia Vaccine 1+ Years old (1 of 2 - PCV) Never done   Zoster Vaccines- Shingrix (1 of 2) Never done   OPHTHALMOLOGY EXAM  03/05/2021    Colorectal cancer screening: Type of screening: Colonoscopy. Completed 11/16/14. Repeat every 10 years  Mammogram status: Ordered today. Pt provided with contact info and advised to call to schedule appt.   Bone Density status: Completed 05/21/21. Results reflect: Bone density results: NORMAL. Repeat every 5 years.  Lung Cancer Screening: (Low Dose CT Chest recommended if Age 43-80 years, 30 pack-year currently smoking OR have quit w/in 15years.) does not qualify.   Lung Cancer Screening Referral: n/a  Additional Screening:  Hepatitis C Screening: does qualify; Completed 07/09/16  Vision Screening: Recommended annual ophthalmology exams for early detection of glaucoma and other disorders of the eye. Is the patient up to date with their annual eye exam?  No  Who is the provider or what is the name of the office in which the patient attends annual eye exams? none If pt is not established with a provider, would they like to be referred to a provider to establish care?  Order previously placed .   Dental  Screening: Recommended annual dental exams for proper oral hygiene  Community Resource Referral / Chronic Care Management: CRR required this visit?  No   CCM required this visit?  No      Plan:  I have personally reviewed and noted the following in the patient's chart:   Medical and social history Use of alcohol, tobacco or illicit drugs  Current medications and supplements including opioid prescriptions. Patient is not currently taking opioid prescriptions. Functional ability and status Nutritional status Physical activity Advanced directives List of other physicians Hospitalizations, surgeries, and ER visits in previous 12 months Vitals Screenings to include cognitive, depression, and falls Referrals and appointments  In addition, I have reviewed and discussed with patient certain preventive protocols, quality metrics, and best practice recommendations. A written personalized care plan for preventive services as well as general preventive health recommendations were provided to patient.     Durwin Nora, [place]   1/61/0960   Due to this being a virtual visit, the after visit summary with patients personalized plan was offered to patient via mail or my-chart. / Patient would like to access on my-chart  Nurse Notes: No concerns

## 2023-03-10 ENCOUNTER — Telehealth: Payer: Self-pay | Admitting: Internal Medicine

## 2023-03-10 NOTE — Telephone Encounter (Signed)
New Messagea:     Patient said she would like to have the ATTR test added to her blood work please.

## 2023-03-11 NOTE — Telephone Encounter (Signed)
Pt called to ask if she can have an ATTR lab test added on to her planned labs for [id number].   She says she has an array of problems and has been doing some research on her own and has been reading about Amyloid.   She has had carpal tunnel, skin issues she will be seeing Dermatology. GI issues and she has consult with GI I a few weeks. She just thinks there is something bigger contributing to her problems.   I will forward to Dr Tenny Craw to see if okay to order.

## 2023-03-12 ENCOUNTER — Other Ambulatory Visit: Payer: Self-pay

## 2023-03-16 ENCOUNTER — Ambulatory Visit: Payer: Self-pay | Attending: Internal Medicine

## 2023-03-19 ENCOUNTER — Ambulatory Visit (INDEPENDENT_AMBULATORY_CARE_PROVIDER_SITE_OTHER): Payer: PPO | Admitting: Family Medicine

## 2023-03-19 ENCOUNTER — Encounter: Payer: Self-pay | Admitting: Family Medicine

## 2023-03-19 VITALS — BP 135/76 | HR 84 | Temp 99.1°F | Ht 65.0 in | Wt 199.2 lb

## 2023-03-19 DIAGNOSIS — J309 Allergic rhinitis, unspecified: Secondary | ICD-10-CM

## 2023-03-19 DIAGNOSIS — J069 Acute upper respiratory infection, unspecified: Secondary | ICD-10-CM | POA: Diagnosis not present

## 2023-03-19 MED ORDER — AZELASTINE HCL 0.1 % NA SOLN: 2.0000 | Freq: Two times a day (BID) | NASAL | 12 refills | Status: DC

## 2023-03-19 NOTE — Progress Notes (Signed)
    SUBJECTIVE:   CHIEF COMPLAINT / HPI:   Cough Started as itchy throat 4 days ago. Then developed nasal congestion. Then cough. No known fever. No chest pain or shortness of breath. No GI symptoms. No sick contacts.  Took one Sudafed and some Mucinex for relief. Also using honey with ginger and green tea. Takes Claritin daily for allergies although doesn't find this particularly helpful. Does not use her flonase because she feels it makes her more congested.  PERTINENT  PMH / PSH: T2DM, HTN, sickle cell trait  OBJECTIVE:   BP 135/76   Pulse 84   Temp 99.1 F (37.3 C)   Ht 5\' 5"  (1.651 m)   Wt 199 lb 3.2 oz (90.4 kg)   BMI 33.15 kg/m   Gen: alert, well-appearing, NAD Head: [place]/AT Eyes: normal sclera and conjunctiva, PERRL Ears: external ears, canals, and TMs normal bilaterally Nose: nares patent, significant inferior turbinate hypertrophy bilaterally Throat: oropharynx unremarkable without tonsillar edema, erythema or exudate Neck: no cervical or supraclavicular lymphadenopathy CV: RRR, normal S1/S2 without m/r/g Resp: normal effort, lungs CTAB  Skin: no rashes on exposed skin Neuro: grossly intact   ASSESSMENT/PLAN:   Viral URI - symptoms and exam c/w viral URI  - no evidence of strep pharyngitis, CAP, AOM, bacterial sinusitis, or other bacterial infection - reassurance provided - discussed symptomatic management, natural course, and return precautions - has allergic rhinitis as well. Continue Claritin. Change Flonase to nasal azelastine.     Maury Dus, MD [facility]

## 2023-03-19 NOTE — Patient Instructions (Signed)
It was great to see you!  You have a common cold which is caused by a virus. This should get better on it's own over the next week although the cough can linger. What to do: -stay hydrated -get adequate sleep -use honey to soothe cough -you can take over-the-counter medication such as Mucinex although research shows this does not change the duration of a cold  You also have allergies. -continue your claritin (or switch to allegra if you prefer) -start the new nasal spray that I sent to your pharmacy  Let us know if your symptoms do not improve or if you get worse (new fever, shortness of breath, other concerns).  Take care, Dr Anner Crete

## 2023-04-02 ENCOUNTER — Ambulatory Visit: Payer: Self-pay

## 2023-04-07 ENCOUNTER — Telehealth: Payer: Self-pay | Admitting: Internal Medicine

## 2023-04-07 DIAGNOSIS — I493 Ventricular premature depolarization: Secondary | ICD-10-CM | POA: Diagnosis not present

## 2023-04-07 DIAGNOSIS — R002 Palpitations: Secondary | ICD-10-CM | POA: Diagnosis not present

## 2023-04-07 NOTE — Telephone Encounter (Signed)
Spoke to pt, she wore Zio monitor 3 months ago for 7 days. Today she received another Zio monitor in the mail and seeking clarification on why another one was ordered.   Advised pt Dr. Tenny Craw recommendation:   Reviewed with patient at clinic visit I have also reviewed this monitor and previous monitor results with electrical cardiologists Would recommend Toprol XL 25 mg    THe heart muscle does not do as well when having so many skips   Goal to suppress the PVCs Would then recomm follow up monitor in about 3 to 4 months  Pt voiced understanding.

## 2023-04-07 NOTE — Telephone Encounter (Signed)
Pt would like a callback regarding a Irthym Device she received and would like a callback to discuss the next steps. Please advise.

## 2023-04-09 ENCOUNTER — Ambulatory Visit: Payer: Self-pay

## 2023-04-16 ENCOUNTER — Ambulatory Visit
Admission: RE | Admit: 2023-04-16 | Discharge: 2023-04-16 | Disposition: A | Discharge status: Home / Self Care | Payer: PPO | Source: Ambulatory Visit | Attending: Family Medicine | Admitting: Family Medicine

## 2023-04-16 DIAGNOSIS — Z1231 Encounter for screening mammogram for malignant neoplasm of breast: Secondary | ICD-10-CM

## 2023-04-21 DIAGNOSIS — R002 Palpitations: Secondary | ICD-10-CM | POA: Diagnosis not present

## 2023-04-21 DIAGNOSIS — I493 Ventricular premature depolarization: Secondary | ICD-10-CM | POA: Diagnosis not present

## 2023-04-27 ENCOUNTER — Telehealth: Payer: Self-pay

## 2023-04-27 DIAGNOSIS — I493 Ventricular premature depolarization: Secondary | ICD-10-CM

## 2023-04-27 MED ORDER — METOPROLOL SUCCINATE ER 50 MG PO TB24: 50.0000 mg | ORAL_TABLET | Freq: Every day | ORAL | 3 refills | Status: DC

## 2023-04-27 NOTE — Telephone Encounter (Signed)
Pt advised and verbalized understanding of her monitor results and Dr Tenny Craw' recommendations... she has only been taking the Toprol 3 times a week but will start taking daily and will take the increased dose 50 mg and let us know if she is tolerating it.

## 2023-04-27 NOTE — Telephone Encounter (Signed)
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

## 2023-05-03 ENCOUNTER — Ambulatory Visit (HOSPITAL_COMMUNITY): Payer: PPO | Attending: Internal Medicine

## 2023-05-03 ENCOUNTER — Other Ambulatory Visit: Payer: Self-pay

## 2023-05-03 DIAGNOSIS — I119 Hypertensive heart disease without heart failure: Secondary | ICD-10-CM | POA: Insufficient documentation

## 2023-05-03 DIAGNOSIS — E785 Hyperlipidemia, unspecified: Secondary | ICD-10-CM | POA: Diagnosis not present

## 2023-05-03 DIAGNOSIS — I340 Nonrheumatic mitral (valve) insufficiency: Secondary | ICD-10-CM | POA: Diagnosis not present

## 2023-05-03 DIAGNOSIS — E119 Type 2 diabetes mellitus without complications: Secondary | ICD-10-CM | POA: Diagnosis not present

## 2023-05-03 DIAGNOSIS — I493 Ventricular premature depolarization: Secondary | ICD-10-CM | POA: Diagnosis not present

## 2023-05-03 DIAGNOSIS — I2510 Atherosclerotic heart disease of native coronary artery without angina pectoris: Secondary | ICD-10-CM | POA: Diagnosis not present

## 2023-05-03 LAB — ECHOCARDIOGRAM LIMITED
Area-P 1/2: 3.34 cm2
S' Lateral: 3.6 cm

## 2023-05-03 MED ORDER — FREESTYLE LIBRE 3 SENSOR MISC: 3 refills | Status: DC

## 2023-05-03 NOTE — Telephone Encounter (Signed)
Patient calls nurse line requesting refill on Freestyle Libre 3 sensors.   Pended request to this encounter.   Veronda Prude, RN

## 2023-05-06 ENCOUNTER — Telehealth: Payer: Self-pay

## 2023-05-06 NOTE — Telephone Encounter (Signed)
Patient calls nurse line reporting a blood sugar of ~300.  She reports this occurred this morning around 10am after she ate a "high carb" breakfast.   She reports now her cbg is 170. She reports she is asymptomatic.   She reports she stopped taking Ozempic due to undesired GI side effects. She is requesting an alternative medication to help with blood sugar.   Patient scheduled with PCP for 8/8 to discuss.   Patient advised to monitor her blood sugar and to call if cbgs continue to trend upwards.   Precautions discussed.

## 2023-05-20 ENCOUNTER — Ambulatory Visit: Payer: PPO | Admitting: Student

## 2023-05-25 ENCOUNTER — Encounter: Payer: Self-pay | Admitting: Student

## 2023-05-25 ENCOUNTER — Ambulatory Visit (INDEPENDENT_AMBULATORY_CARE_PROVIDER_SITE_OTHER): Payer: PPO | Admitting: Student

## 2023-05-25 ENCOUNTER — Other Ambulatory Visit: Payer: Self-pay

## 2023-05-25 VITALS — BP 136/74 | HR 54 | Ht 65.0 in | Wt 199.4 lb

## 2023-05-25 DIAGNOSIS — M6740 Ganglion, unspecified site: Secondary | ICD-10-CM | POA: Diagnosis not present

## 2023-05-25 DIAGNOSIS — I493 Ventricular premature depolarization: Secondary | ICD-10-CM | POA: Diagnosis not present

## 2023-05-25 DIAGNOSIS — E119 Type 2 diabetes mellitus without complications: Secondary | ICD-10-CM

## 2023-05-25 LAB — POCT GLYCOSYLATED HEMOGLOBIN (HGB A1C): HbA1c, POC (controlled diabetic range): 6.4 % (ref 0.0–7.0)

## 2023-05-25 MED ORDER — METOPROLOL SUCCINATE ER 25 MG PO TB24
50.0000 mg | ORAL_TABLET | Freq: Every day | ORAL | 3 refills | Status: DC
Start: 2023-05-25 — End: 2023-05-27

## 2023-05-25 MED ORDER — MELOXICAM 15 MG PO TABS: 15.0000 mg | ORAL_TABLET | Freq: Every day | ORAL | Status: DC | PRN

## 2023-05-25 NOTE — Progress Notes (Unsigned)
    SUBJECTIVE:   CHIEF COMPLAINT / HPI:   Diabetes [name] was 6.3% in February.  Stopped Ozempic about 3 weeks ago due to abdominal pain. Got a [employer] and has been monitoring sugars.  Typical morning 117-140.   Post-prandial was max 240, downtrend to 170 or 150.  [name] is 6.4%.   Just trying to eat healthier. Takes berberine and cinnamon. Also getting ginger. Declines a statin and pneumonia vaccine today.    Ganglion Cyst Seen previously by me and by ortho Dr. Teressa Senter. Diagnosed with ganglion cyst of the left wrist. Was told there was nothing to be done as long as it was not causing pain. Now she's having pain as it interferes with wrist extension. Requesting referral to a different hand doc for a second opinion.   OBJECTIVE:   BP 136/74   Pulse (!) 54   Ht 5\' 5"  (1.651 m)   Wt 199 lb 6.4 oz (90.4 kg)   SpO2 98%   BMI 33.18 kg/m   Physical Exam Vitals reviewed.  Constitutional:      General: She is not in acute distress. Cardiovascular:     Rate and Rhythm: Normal rate and regular rhythm.     Heart sounds: No murmur heard. Pulmonary:     Breath sounds: No wheezing or rales.  Abdominal:     General: There is no distension.     Tenderness: There is no abdominal tenderness.  Musculoskeletal:     Comments: 2cm soft cystic lesion to the extensor surface of the left wrist. Non-painful to palpation.   Skin:    General: Skin is warm and dry.     Findings: No rash.  Neurological:     General: No focal deficit present.  Psychiatric:        Mood and Affect: Mood normal.      ASSESSMENT/PLAN:   Type 2 diabetes mellitus without complication, without long-term current use of insulin (HCC) Excellent [name] control. She does not like taking medications, so not surprised she prefers a trial off Ozempic. We will see if her lifestyle efforts can carry her here. The data from her Josephine Igo so far suggests they may.  - CGM monitoring - Lifestyle interventions, encouraged introducing  resistance training during our discussion today - Return in 57mo for [name] check - She declines statin or PNA vaccine   Ganglion cyst As this is becoming symptomatic/bothersome to her, I will have her re-evaluated by hand surgery. She prefers a different office. - Referral placed for second opinion  Frequent PVCs She is wondering if she really needs her metoprolol. Counseled on the risk of PVC-cardiomyopathy. She is amenable to continuing beta blockade.HR in 50s today, so discussed dose-reduction. Sounds like she only take 25mg  daily most days anyways. - Refill of metoprolol succinate today, reduced dose 50mg >25mg  daily      J Dorothyann Gibbs, MD [facility]

## 2023-05-25 NOTE — Patient Instructions (Addendum)
Ms. Dallis,  I still would love to get you your TDaP, Pneumonia, and Shingles shots.  Keep an eye on your sugars. Come back to see me in 3 months. Let me know if your sugars are running too high.   I'm referring you to a new hand surgeon for your wrist. I think a second opinion is reasonable since you're having pain.  Eliezer Mccoy, MD

## 2023-05-27 ENCOUNTER — Encounter: Payer: Self-pay | Admitting: Pharmacist

## 2023-05-27 DIAGNOSIS — M6740 Ganglion, unspecified site: Secondary | ICD-10-CM | POA: Insufficient documentation

## 2023-05-27 MED ORDER — METOPROLOL SUCCINATE ER 25 MG PO TB24: 25.0000 mg | ORAL_TABLET | Freq: Every day | ORAL | Status: AC

## 2023-05-27 NOTE — Assessment & Plan Note (Signed)
Excellent [name] control. She does not like taking medications, so not surprised she prefers a trial off Ozempic. We will see if her lifestyle efforts can carry her here. The data from her Josephine Igo so far suggests they may.  - CGM monitoring - Lifestyle interventions, encouraged introducing resistance training during our discussion today - Return in 56mo for [name] check - She declines statin or PNA vaccine

## 2023-05-27 NOTE — Assessment & Plan Note (Signed)
As this is becoming symptomatic/bothersome to her, I will have her re-evaluated by hand surgery. She prefers a different office. - Referral placed for second opinion

## 2023-05-27 NOTE — Progress Notes (Signed)
Adherence check-in. Appears adherent - atorvastatin 90 day fill in May.

## 2023-05-27 NOTE — Assessment & Plan Note (Addendum)
She is wondering if she really needs her metoprolol. Counseled on the risk of PVC-cardiomyopathy. She is amenable to continuing beta blockade.HR in 50s today, so discussed dose-reduction. Sounds like she only take 25mg  daily most days anyways. - Refill of metoprolol succinate today, reduced dose 50mg >25mg  daily

## 2023-06-02 ENCOUNTER — Telehealth: Payer: Self-pay

## 2023-06-02 ENCOUNTER — Other Ambulatory Visit: Payer: Self-pay | Admitting: Student

## 2023-06-02 ENCOUNTER — Encounter: Payer: Self-pay | Admitting: Student

## 2023-06-02 DIAGNOSIS — J019 Acute sinusitis, unspecified: Secondary | ICD-10-CM

## 2023-06-02 MED ORDER — AZELASTINE-FLUTICASONE 137-50 MCG/ACT NA SUSP
1.0000 | Freq: Two times a day (BID) | NASAL | 1 refills | Status: AC
Start: 2023-06-02 — End: ?

## 2023-06-02 NOTE — Telephone Encounter (Signed)
Patient calls nurse line regarding sinus headache and sinus pressure. She reports that symptoms have been going on for about 5 days now.   Denies fever or chills.   She is asking if she could receive medication for sinus infection. Offered to schedule appointment for tomorrow for further evaluation. She states that she is leaving to go out of town tomorrow and would not be able to come into the office.   Schedule is completely booked for this afternoon.   Will forward to PCP for further recommendations. Patient is asking for either antibiotics or provider recommendations for alternatives.   Veronda Prude, RN

## 2023-06-03 MED ORDER — FLUTICASONE PROPIONATE 50 MCG/ACT NA SUSP: 2.0000 | Freq: Every day | NASAL | 6 refills | Status: DC

## 2023-06-28 ENCOUNTER — Other Ambulatory Visit: Payer: Self-pay | Admitting: Student

## 2023-06-29 ENCOUNTER — Other Ambulatory Visit: Payer: Self-pay | Admitting: Student

## 2023-07-01 ENCOUNTER — Other Ambulatory Visit: Payer: Self-pay | Admitting: Student

## 2023-07-05 ENCOUNTER — Ambulatory Visit: Payer: PPO | Admitting: Orthopedic Surgery

## 2023-07-05 ENCOUNTER — Other Ambulatory Visit (INDEPENDENT_AMBULATORY_CARE_PROVIDER_SITE_OTHER): Payer: PPO

## 2023-07-05 DIAGNOSIS — R2232 Localized swelling, mass and lump, left upper limb: Secondary | ICD-10-CM

## 2023-07-05 DIAGNOSIS — M67432 Ganglion, left wrist: Secondary | ICD-10-CM

## 2023-07-05 NOTE — Progress Notes (Signed)
Rachael Garcia - 67 y.o. female MRN [id number]  Date of birth: 1956/06/02  Office Visit Note: Visit Date: 07/05/2023 PCP: Alicia Amel, MD Referred by: Westley Chandler, MD  Subjective: No chief complaint on file.  HPI: Rachael Garcia is a pleasant 67 y.o. female who presents today for evaluation of the left hand dorsal mass has been present for over 1 year.  She states that it has grown in size more recently has become more painful.  No prior treatments, no prior workup.  Does have a history of bilateral carpal tunnel release performed over 20 years prior.  She also does have history of lichen planus.  Pertinent ROS were reviewed with the patient and found to be negative unless otherwise specified above in HPI.   Visit Reason: left hand dorsal mass Duration of symptoms: 1+year Hand dominance: right Occupation: Stock room at [employer] Diabetic: Yes/ 6.4 Smoking: No Heart/Lung History: no Blood Thinners: none  Prior Testing/EMG: none Injections (Date): none Treatments:none Prior Surgery: hx of B CTR   Assessment & Plan: Visit Diagnoses:  1. Ganglion cyst of dorsum of left wrist     Plan: Extensive discussion was had the patient today regarding her left hand dorsal mass.  Clinically, this demonstrate signs and symptoms consistent with a ganglion cyst emanating from the extensor apparatus.  Given its increase in size and symptomology more recently, she is interested in potential surgical excision.  I would like to obtain an MRI of the left hand in order to better delineate size, location and specificity of the mass in question prior to surgical intervention.  She will return to me after the study is complete for discussion and potential surgical planning.  Follow-up: No follow-ups on file.   Meds & Orders: No orders of the defined types were placed in this encounter.   Orders Placed This Encounter  Procedures   XR Hand Complete Left   MR HAND LEFT WO CONTRAST      Procedures: No procedures performed      Clinical History: No specialty comments available.  She reports that she quit smoking about 11 years ago. Her smoking use included cigarettes. She started smoking about 19 years ago. She has been exposed to tobacco smoke. She has never used smokeless tobacco.  Recent Labs    08/28/22 1418 11/30/22 0847 05/25/23 1500  HGBA1C 6.0 6.3 6.4    Objective:   Vital Signs: There were no vitals taken for this visit.  Physical Exam  Gen: Well-appearing, in no acute distress; non-toxic CV: Regular Rate. Well-perfused. Warm.  Resp: Breathing unlabored on room air; no wheezing. Psych: Fluid speech in conversation; appropriate affect; normal thought process  Ortho Exam Left hand: - 3 x 4 cm dorsal hand mass, distal to wrist crease, proximal to MCP region - Wrist range of motion is well-preserved flexion/extension 70/60, full digital range of motion - Mass is mobile, seen with extensor motion, nontender, mobile, soft, compressible  Imaging: No results found.  Past Medical/Family/Surgical/Social History: Medications & Allergies reviewed per EMR, new medications updated. 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   Health care maintenance 01/29/2021   Lichen planus 07/20/2020   Anxiety 05/15/2020   Type 2 diabetes mellitus without complication, without long-term current use of insulin (HCC) 12/14/2019   Neuropathy in diabetes (HCC)    Hyperlipidemia 08/14/2010   Essential hypertension 08/14/2010   SICKLE CELL TRAIT 08/13/2010  GERD 08/13/2010   ASTHMA 04/04/2007   Past Medical History:  Diagnosis Date   Allergy    Anemia    Anniversary reaction 12/14/2019   Anxiety    Arthritis    Blood dyscrasia    sickle cell trait   Complication of anesthesia    o2 sat drop with epidural for  c-section surgery ;  subsequent surgeries no problems   Diabetes (HCC)    TYPE 2 ( 3 YEARS  AGO)   Diabetes mellitus without complication (HCC)    06-2016 had hga1c of 7 prescribed metformin; returned for repeat a1c that was a 5.? ; does not take any diabetes medication currently    Fibroids    uterine   GERD (gastroesophageal reflux disease)    Heart murmur    "i've always had this"    Hyperlipidemia    Hypertension    IBS (irritable bowel syndrome)    Irritable bowel syndrome 08/13/2010   Qualifier: Diagnosis of  By: Koleen Distance CMA (AAMA), Leisha     Kidney stone    Lichen planus    Shoulder pain, right 01/11/2020   Upper airway cough syndrome 06/01/2017   Max rx for gerd plus 1st gen h1  06/01/2017 >>>  - Allergy profile 06/01/2017 >  Eos 0.2 /  IgE  63 PAST pos cat grass tree    Family History  Problem Relation Age of Onset   Arthritis Mother    Anemia Mother    Hypertension Mother    Hypertension Father    Alcohol abuse Father    Heart disease Father 65       defibrillator and pacemaker   Hyperlipidemia Father    Diabetes Sister    Stroke Sister    Multiple myeloma Maternal Grandmother    Stomach cancer Paternal Grandfather    Diabetes Brother    Cancer Brother        Throat   Colon cancer Neg Hx    Breast cancer Neg Hx    Past Surgical History:  Procedure Laterality Date   CARPAL TUNNEL RELEASE Right 03   CARPAL TUNNEL RELEASE  08/20/2011   Procedure: CARPAL TUNNEL RELEASE;  Surgeon: Wyn Forster., MD;  Location: [facility];  Service: Orthopedics;  Laterality: Left;   CESAREAN SECTION  83,92   x 2   COLONOSCOPY     DIAGNOSTIC LAPAROSCOPY     ovarian cyst removal   DILATION AND CURETTAGE OF UTERUS     MASS EXCISION Right 07/03/2015   Procedure: EXCISION RIGHT LONG FINGER MASS;  Surgeon: Dairl Ponder, MD;  Location: [facility];  Service: Orthopedics;  Laterality: Right;   ROBOTIC ASSISTED BILATERAL SALPINGO OOPHERECTOMY Bilateral 06/29/2017   Procedure: XI ROBOTIC ASSISTED BILATERAL SALPINGO OOPHORECTOMY WITH PERITONEAL  WASHINGS;  Surgeon: Adolphus Birchwood, MD;  Location: [facility];  Service: Gynecology;  Laterality: Bilateral;   TUBAL LIGATION     Social History   Occupational History   Occupation: Best boy: [employer]   Occupation: Retired  Tobacco Use   Smoking status: Former    Current packs/day: 0.00    Types: Cigarettes    Start date: 07/13/2003    Quit date: 07/13/2011    Years since quitting: 11.9    Passive exposure: Past   Smokeless tobacco: Never  Vaping Use   Vaping status: Never Used  Substance and Sexual Activity   Alcohol use: Yes    Alcohol/week: 3.0 standard  drinks of alcohol    Types: 3 Glasses of wine per week    Comment: OCCASIONALLY WINE   Drug use: No   Sexual activity: Not Currently    Birth control/protection: Post-menopausal    Comment: 1st intercourse 67 yo-More than 5 partners    Arien Morine Fara Boros) Denese Killings, M.D. [facility] 1:55 PM

## 2023-07-29 ENCOUNTER — Telehealth: Payer: Self-pay | Admitting: Orthopedic Surgery

## 2023-07-29 NOTE — Telephone Encounter (Signed)
Called pt Rachael Garcia to CB and schedule MRI review with Ash on 11/8 please advise no avail appt must message Lequita Halt with appt time pt choose if for that date

## 2023-08-03 ENCOUNTER — Ambulatory Visit
Admission: RE | Admit: 2023-08-03 | Discharge: 2023-08-03 | Disposition: A | Discharge status: Home / Self Care | Payer: PPO | Source: Ambulatory Visit | Attending: Orthopedic Surgery | Admitting: Orthopedic Surgery

## 2023-08-03 ENCOUNTER — Encounter: Payer: Self-pay | Admitting: Sports Medicine

## 2023-08-03 DIAGNOSIS — M67432 Ganglion, left wrist: Secondary | ICD-10-CM

## 2023-08-03 DIAGNOSIS — R2242 Localized swelling, mass and lump, left lower limb: Secondary | ICD-10-CM | POA: Diagnosis not present

## 2023-08-20 ENCOUNTER — Encounter: Payer: Self-pay | Admitting: Student

## 2023-08-20 LAB — HM DIABETES EYE EXAM

## 2023-08-24 ENCOUNTER — Ambulatory Visit: Payer: PPO | Admitting: Student

## 2023-08-26 ENCOUNTER — Encounter: Payer: Self-pay | Admitting: Student

## 2023-08-26 ENCOUNTER — Ambulatory Visit (INDEPENDENT_AMBULATORY_CARE_PROVIDER_SITE_OTHER): Payer: PPO | Admitting: Student

## 2023-08-26 VITALS — BP 128/70 | HR 85 | Ht 65.0 in | Wt 203.2 lb

## 2023-08-26 DIAGNOSIS — Z7985 Long-term (current) use of injectable non-insulin antidiabetic drugs: Secondary | ICD-10-CM | POA: Diagnosis not present

## 2023-08-26 DIAGNOSIS — E119 Type 2 diabetes mellitus without complications: Secondary | ICD-10-CM

## 2023-08-26 LAB — POCT GLYCOSYLATED HEMOGLOBIN (HGB A1C): HbA1c, POC (controlled diabetic range): 7.4 % — AB (ref 0.0–7.0)

## 2023-08-26 MED ORDER — MOUNJARO 2.5 MG/0.5ML ~~LOC~~ SOAJ: 2.5000 mg | SUBCUTANEOUS | 0 refills | Status: DC

## 2023-08-26 NOTE — Patient Instructions (Addendum)
Ms. Ronelle, Stache to see you! Let's give Greggory Keen a try. We'll start at the lowest dose (2.5mg  weekly) and go from there. I'll see you for a video visit in 4 weeks. I want you think about your pneumonia vaccine and would LOVE to see you take your statin.  Eliezer Mccoy, MD

## 2023-08-27 NOTE — Progress Notes (Signed)
    SUBJECTIVE:   CHIEF COMPLAINT / HPI:   DM2 Folllow-up Patient is here to follow-up on her diabetes. She was previously on Ozempic monotherapy due to intolerance of metformin in the past (GI upset). Unfortunately, she also struggled with some GI issues with the Ozempic, namely abdominal pain/cramping. She was therefore unable to titrate beyond the 0.25mg  weekly dose and at our last visit we elected to d/c all meds and see how she did off of medication. .  Returns today with an [name] of 7.4%, up from 6.4% at our last visit. We would both like to see her better controlled given she is otherwise quite healthy and active for age. She is amenable to trying a different GLP-1a agent. She is prescribed a statin, but affirms today that she has not been taking it.   Health Maintenance We again discussed the pneumonia and shingles vaccines, she again declines.   PERTINENT  PMH / PSH: HTN, PVCs   OBJECTIVE:   BP 128/70 Comment: Confirmed manually  Pulse 85   Ht 5\' 5"  (1.651 m)   Wt 203 lb 3.2 oz (92.2 kg)   SpO2 99%   BMI 33.81 kg/m   General: alert & oriented, no apparent distress, well groomed HEENT: normocephalic, atraumatic, EOM grossly intact, oral mucosa moist, neck supple Respiratory: normal respiratory effort GI: non-distended Skin: no rashes, no jaundice Psych: appropriate mood and affect   ASSESSMENT/PLAN:   Type 2 diabetes mellitus without complication, without long-term current use of insulin (HCC) Suboptimal control with diet and lifestyle alone. As she is willing to try another GLP-1a, I think Greggory Keen is a reasonable next step. Greggory Keen 2.5mg [place] - MyChart video visit in 4 weeks for follow-up    Healthcare Maintenance Declines pneumonia and shingles vaccines today.    Eliezer Mccoy, MD [facility]

## 2023-08-27 NOTE — Assessment & Plan Note (Addendum)
Suboptimal control with diet and lifestyle alone. As she is willing to try another GLP-1a, I think Greggory Keen is a reasonable next step. - Mounjaro 2.5mg [place] - MyChart video visit in 4 weeks for follow-up

## 2023-09-08 ENCOUNTER — Telehealth: Payer: Self-pay

## 2023-09-08 NOTE — Telephone Encounter (Signed)
Pharmacy Patient Advocate Encounter  Received notification from [facility] ADVANTAGE/RX ADVANCE that Prior Authorization for [name] has been APPROVED from 09/08/23 to 09/07/24   PA #/Case ID/Reference #: [id number]

## 2023-09-08 NOTE — Telephone Encounter (Signed)
Pharmacy Patient Advocate Encounter   Received notification from CoverMyMeds that prior authorization for [facility] is required/requested.  The patient is insured through [facility] ADVANTAGE/RX ADVANCE .   PA required; PA submitted to above mentioned insurance via CoverMyMeds Key/confirmation #/EOC [facility]. Status is pending

## 2023-09-23 ENCOUNTER — Other Ambulatory Visit (HOSPITAL_COMMUNITY): Payer: Self-pay

## 2023-09-27 ENCOUNTER — Telehealth (INDEPENDENT_AMBULATORY_CARE_PROVIDER_SITE_OTHER): Payer: PPO | Admitting: Student

## 2023-09-27 ENCOUNTER — Ambulatory Visit: Payer: PPO | Admitting: Orthopedic Surgery

## 2023-09-27 ENCOUNTER — Encounter: Payer: Self-pay | Admitting: Orthopedic Surgery

## 2023-09-27 DIAGNOSIS — R2232 Localized swelling, mass and lump, left upper limb: Secondary | ICD-10-CM | POA: Diagnosis not present

## 2023-09-27 DIAGNOSIS — E119 Type 2 diabetes mellitus without complications: Secondary | ICD-10-CM

## 2023-09-27 MED ORDER — MOUNJARO 2.5 MG/0.5ML ~~LOC~~ SOAJ: 2.5000 mg | SUBCUTANEOUS | 0 refills | Status: DC

## 2023-09-27 NOTE — Progress Notes (Signed)
Called patient to remind about MyChart video visit. Unfortunately she tells me that she has been unable to pick up her Greggory Keen despite [place] having obtained prior auth. She says that her pharmacy never heard back about the prior auth. Advised that I would send a new Rx and ask our pharmacy team to help get this sorted out and then we will have her follow-up one month after initiation.   Eliezer Mccoy, MD

## 2023-09-27 NOTE — Progress Notes (Signed)
Rachael Garcia - 67 y.o. female MRN [id number]  Date of birth: 20-Aug-1956  Office Visit Note: Visit Date: 09/27/2023 PCP: Alicia Amel, MD Referred by: Alicia Amel, MD  Subjective: Chief Complaint  Patient presents with   Left Hand - Follow-up   HPI: Rachael Garcia is a pleasant 67 y.o. female who presents today for follow-up of the left hand dorsal mass has been present for over 1 year.  She underwent recent MRI which showed extensive tendinosis and tenosynovitis in the fourth extensor compartment, no evidence of cystic formation.  Pertinent ROS were reviewed with the patient and found to be negative unless otherwise specified above in HPI.     Assessment & Plan: Visit Diagnoses:  1. Mass of left hand     Plan: Extensive discussion was had the patient today regarding her left hand dorsal mass.  MRI was reviewed in detail today which consistent with tendinosis and tenosynovitis within the fourth extensor compartment.  This does explain why her symptoms are migrating up and down the forearm.  Will arrange for occupational therapy to see her for tendon gliding exercises particularly at the wrist and hand.  She can also utilize a wrist brace as needed.  We discussed utilization of topical anti-inflammatory medication as well, specifically Voltaren.  She expressed full understanding, will return to me as needed moving forward.  Follow-up: No follow-ups on file.   Meds & Orders: No orders of the defined types were placed in this encounter.   Orders Placed This Encounter  Procedures   Ambulatory referral to Occupational Therapy     Procedures: No procedures performed      Clinical History: No specialty comments available.  She reports that she quit smoking about 12 years ago. Her smoking use included cigarettes. She started smoking about 20 years ago. She has been exposed to tobacco smoke. She has never used smokeless tobacco.  Recent Labs    11/30/22 0847  05/25/23 1500 08/26/23 1537  HGBA1C 6.3 6.4 7.4*    Objective:   Vital Signs: There were no vitals taken for this visit.  Physical Exam  Gen: Well-appearing, in no acute distress; non-toxic CV: Regular Rate. Well-perfused. Warm.  Resp: Breathing unlabored on room air; no wheezing. Psych: Fluid speech in conversation; appropriate affect; normal thought process  Ortho Exam Left hand: - 3 x 4 cm dorsal hand swelling, distal to wrist crease, proximal to MCP region - Wrist range of motion is well-preserved flexion/extension 70/60, full digital range of motion - Mass is mobile, seen with extensor motion, mildly tender, mobile, soft, compressible  Imaging: No results found.  Past Medical/Family/Surgical/Social History: Medications & Allergies reviewed per EMR, new medications updated. 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   Health care maintenance 01/29/2021   Lichen planus 07/20/2020   Anxiety 05/15/2020   Type 2 diabetes mellitus without complication, without long-term current use of insulin (HCC) 12/14/2019   Neuropathy in diabetes (HCC)    Hyperlipidemia 08/14/2010   Essential hypertension 08/14/2010   SICKLE CELL TRAIT 08/13/2010   GERD 08/13/2010   ASTHMA 04/04/2007   Past Medical History:  Diagnosis Date   Allergy    Anemia    Anniversary reaction 12/14/2019   Anxiety    Arthritis    Blood dyscrasia    sickle cell trait   Complication of anesthesia    o2 sat drop with epidural for  c-section surgery ;  subsequent surgeries no problems   Diabetes (HCC)    TYPE 2 ( 3 YEARS AGO)   Diabetes mellitus without complication (HCC)    06-2016 had hga1c of 7 prescribed metformin; returned for repeat a1c that was a 5.? ; does not take any diabetes medication currently    Fibroids    uterine   GERD (gastroesophageal reflux disease)    Heart murmur    "i've always had this"    Hyperlipidemia     Hypertension    IBS (irritable bowel syndrome)    Irritable bowel syndrome 08/13/2010   Qualifier: Diagnosis of  By: Koleen Distance CMA (AAMA), Leisha     Kidney stone    Lichen planus    Shoulder pain, right 01/11/2020   Upper airway cough syndrome 06/01/2017   Max rx for gerd plus 1st gen h1  06/01/2017 >>>  - Allergy profile 06/01/2017 >  Eos 0.2 /  IgE  63 PAST pos cat grass tree    Family History  Problem Relation Age of Onset   Arthritis Mother    Anemia Mother    Hypertension Mother    Hypertension Father    Alcohol abuse Father    Heart disease Father 35       defibrillator and pacemaker   Hyperlipidemia Father    Diabetes Sister    Stroke Sister    Multiple myeloma Maternal Grandmother    Stomach cancer Paternal Grandfather    Diabetes Brother    Cancer Brother        Throat   Colon cancer Neg Hx    Breast cancer Neg Hx    Past Surgical History:  Procedure Laterality Date   CARPAL TUNNEL RELEASE Right 03   CARPAL TUNNEL RELEASE  08/20/2011   Procedure: CARPAL TUNNEL RELEASE;  Surgeon: Wyn Forster., MD;  Location: [facility];  Service: Orthopedics;  Laterality: Left;   CESAREAN SECTION  83,92   x 2   COLONOSCOPY     DIAGNOSTIC LAPAROSCOPY     ovarian cyst removal   DILATION AND CURETTAGE OF UTERUS     MASS EXCISION Right 07/03/2015   Procedure: EXCISION RIGHT LONG FINGER MASS;  Surgeon: Dairl Ponder, MD;  Location: [facility];  Service: Orthopedics;  Laterality: Right;   ROBOTIC ASSISTED BILATERAL SALPINGO OOPHERECTOMY Bilateral 06/29/2017   Procedure: XI ROBOTIC ASSISTED BILATERAL SALPINGO OOPHORECTOMY WITH PERITONEAL WASHINGS;  Surgeon: Adolphus Birchwood, MD;  Location: [facility];  Service: Gynecology;  Laterality: Bilateral;   TUBAL LIGATION     Social History   Occupational History   Occupation: Best boy: [employer]   Occupation: Retired  Tobacco Use   Smoking status: Former     Current packs/day: 0.00    Types: Cigarettes    Start date: 07/13/2003    Quit date: 07/13/2011    Years since quitting: 12.2    Passive exposure: Past   Smokeless tobacco: Never  Vaping Use   Vaping status: Never Used  Substance and Sexual Activity   Alcohol use: Yes    Alcohol/week: 3.0 standard drinks of alcohol    Types: 3 Glasses of wine per week    Comment: OCCASIONALLY WINE   Drug use: No   Sexual activity: Not Currently    Birth control/protection: Post-menopausal    Comment: 1st intercourse 67 yo-More than 5 partners    Yeshua Stryker Fara Boros) Denese Killings, M.D. [facility] 1:33 PM
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
## 2023-10-11 ENCOUNTER — Ambulatory Visit: Payer: PPO | Admitting: Rehabilitative and Restorative Service Providers"

## 2023-10-11 ENCOUNTER — Encounter: Payer: Self-pay | Admitting: Rehabilitative and Restorative Service Providers"

## 2023-10-11 DIAGNOSIS — M6281 Muscle weakness (generalized): Secondary | ICD-10-CM

## 2023-10-11 DIAGNOSIS — M25532 Pain in left wrist: Secondary | ICD-10-CM | POA: Diagnosis not present

## 2023-10-11 DIAGNOSIS — M25632 Stiffness of left wrist, not elsewhere classified: Secondary | ICD-10-CM
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
## 2023-10-19 ENCOUNTER — Ambulatory Visit: Payer: PPO | Admitting: Rehabilitative and Restorative Service Providers"

## 2023-10-19 ENCOUNTER — Encounter: Payer: Self-pay | Admitting: Rehabilitative and Restorative Service Providers"

## 2023-10-19 DIAGNOSIS — M6281 Muscle weakness (generalized): Secondary | ICD-10-CM

## 2023-10-19 DIAGNOSIS — M25632 Stiffness of left wrist, not elsewhere classified: Secondary | ICD-10-CM | POA: Diagnosis not present

## 2023-10-19 DIAGNOSIS — M25532 Pain in left wrist: Secondary | ICD-10-CM
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
## 2023-10-26 ENCOUNTER — Ambulatory Visit: Payer: PPO | Admitting: Rehabilitative and Restorative Service Providers"

## 2023-10-26 ENCOUNTER — Encounter: Payer: Self-pay | Admitting: Rehabilitative and Restorative Service Providers"

## 2023-10-26 DIAGNOSIS — M6281 Muscle weakness (generalized): Secondary | ICD-10-CM | POA: Diagnosis not present

## 2023-10-26 DIAGNOSIS — M25532 Pain in left wrist: Secondary | ICD-10-CM

## 2023-10-26 DIAGNOSIS — M25632 Stiffness of left wrist, not elsewhere classified: Secondary | ICD-10-CM | POA: Diagnosis not present
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
## 2023-11-02 ENCOUNTER — Encounter: Payer: Self-pay | Admitting: Rehabilitative and Restorative Service Providers"

## 2023-11-02 ENCOUNTER — Ambulatory Visit: Payer: PPO | Admitting: Rehabilitative and Restorative Service Providers"

## 2023-11-02 DIAGNOSIS — M25632 Stiffness of left wrist, not elsewhere classified: Secondary | ICD-10-CM

## 2023-11-02 DIAGNOSIS — M25532 Pain in left wrist: Secondary | ICD-10-CM

## 2023-11-02 DIAGNOSIS — M6281 Muscle weakness (generalized): Secondary | ICD-10-CM | POA: Diagnosis not present
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
## 2023-11-09 ENCOUNTER — Ambulatory Visit: Payer: PPO | Admitting: Rehabilitative and Restorative Service Providers"

## 2023-11-09 ENCOUNTER — Encounter: Payer: Self-pay | Admitting: Family Medicine

## 2023-11-09 ENCOUNTER — Encounter: Payer: Self-pay | Admitting: Rehabilitative and Restorative Service Providers"

## 2023-11-09 ENCOUNTER — Ambulatory Visit: Payer: PPO | Admitting: Family Medicine

## 2023-11-09 VITALS — BP 154/64 | HR 85 | Ht 65.0 in | Wt 201.0 lb

## 2023-11-09 DIAGNOSIS — M25532 Pain in left wrist: Secondary | ICD-10-CM

## 2023-11-09 DIAGNOSIS — I10 Essential (primary) hypertension: Secondary | ICD-10-CM

## 2023-11-09 DIAGNOSIS — R682 Dry mouth, unspecified: Secondary | ICD-10-CM

## 2023-11-09 DIAGNOSIS — M6281 Muscle weakness (generalized): Secondary | ICD-10-CM | POA: Diagnosis not present

## 2023-11-09 DIAGNOSIS — M25632 Stiffness of left wrist, not elsewhere classified: Secondary | ICD-10-CM

## 2023-11-09 DIAGNOSIS — R29818 Other symptoms and signs involving the nervous system: Secondary | ICD-10-CM

## 2023-11-09 DIAGNOSIS — R0789 Other chest pain: Secondary | ICD-10-CM

## 2023-11-09 DIAGNOSIS — I493 Ventricular premature depolarization: Secondary | ICD-10-CM | POA: Diagnosis not present

## 2023-11-09 MED ORDER — LOSARTAN POTASSIUM 25 MG PO TABS: 25.0000 mg | ORAL_TABLET | Freq: Every day | ORAL | 1 refills | Status: AC

## 2023-11-09 NOTE — Progress Notes (Unsigned)
    SUBJECTIVE:   CHIEF COMPLAINT / HPI:   Dry mouth and throat issues Dry mouth started 3 weeks ago all of a sudden. May have came on before or around [facility]. She feels like she needs water. Only in the mornings. Feels she has allergies and is a mouth breather at night, but she wants to see. She feels like she normally drinks water throughout the day, and her urine color during the day is yellow and can be more orange after vitamin B supplement. She has woke up coughing and has been told she snores. Most of the time she feels rested, especially since taking the magnesium glycinate.   Chest discomfort Had this morning when she woke up. Did not wake her out of her sleep. Felt like a "cat" was on her chest after she turned to her side. It occurred for 10 minutes at the same intensity. Once she got out of the bed, the pain subsided. The pain was right in the middle of the chest. No fluttering in her chest that she remembers. No SOB during this period. No recurrence on exertion today.  Hypertension Has been taking her amlodipine 10 mg daily as prescribed.  Also takes metoprolol for PVCs.  No chest pain or shortness of breath other than above.  PERTINENT  PMH / PSH: Frequent PVCs, aspirin open GERD, T2DM with neuropathy, sickle cell trait, HLD, anxiety, lichen planus  OBJECTIVE:   BP (!) 154/64   Pulse 85   Ht 5\' 5"  (1.651 m)   Wt 201 lb (91.2 kg)   SpO2 98%   BMI 33.45 kg/m   General: Alert and oriented, in NAD Skin: Warm, dry, and intact HEENT: NCAT, EOM grossly normal, midline nasal septum, sparse Wickham striae noted on buccal mucosa Cardiac: Intermittent PVCs, no m/r/g appreciated Respiratory: CTAB, breathing and speaking comfortably on RA Abdominal: Soft, nontender, nondistended, normoactive bowel sounds Extremities: Moves all extremities grossly equally Neurological: No gross focal deficit Psychiatric: Appropriate mood and affect   ASSESSMENT/PLAN:   Dry mouth Overnight  dehydration and likely OSA contributing.  No likely medications contributing, though her Mounjaro could worsen dehydration.  Discussed increasing fluid intake before bed to target a urine color of pale yellow.  Also discussed sleep study as below.  Presence of Wickham striae on buccal mucosa and history of lichen planus may also be contributing.  Can consider oral steroids if refractory.  Follow-up as needed.  Suspected sleep apnea STOP-BANG score of 3.  Could be contributing to dry mouth as above.  Sleep study referred.  Essential hypertension Uncontrolled on amlodipine 10 mg daily.  Appears to have been on losartan in the past; will trial losartan 25 mg daily again.  Discussed returning in 1 week for BP follow-up and lab draw.  Chest wall pain Due to musculoskeletal strain.  Reassured against cardiac pathology given resolved when changing positions, no shortness of breath, no increased PVC burden that she feels, and lack of recurrence with exertion.  Advised to return to care should symptoms worsen and persist.  Frequent PVCs Continues to have intermittent PVCs on my exam, though she is asymptomatic.  Pulse is normal today at 85 with 25 mg metoprolol succinate.  Unlikely contributing to chest discomfort as above.  Follow-up next visit with PCP.   Health maintenance Patient to discuss recent history of shoulder pain at next visit with PCP.  Janeal Holmes, MD [facility]

## 2023-11-09 NOTE — Patient Instructions (Addendum)
Try to stay more hydrated throughout the day and target a urine color pale yellow.  This could help your dry throat in the mornings.  You could also have sleep apnea, which could be contributing to "mouth breathing" and dry mouth.  I have referred you for a sleep study.  Let me know if this persist despite better hydration!  I believe your chest discomfort this morning was coming from the muscle.  It is less likely your heart since it has not came back and does not happen when you are more active.  However, if you feel more heart fluttering, have more chest pain is not going away, or have more shortness of breath, I recommend returning to care immediately.  Because your blood pressure is still high, I have sent in another medication called losartan.  Be sure to come back in 1 week to follow-up with Dr. Marisue Humble since we made this medication change.

## 2023-11-10 DIAGNOSIS — R0789 Other chest pain: Secondary | ICD-10-CM | POA: Insufficient documentation

## 2023-11-10 DIAGNOSIS — R29818 Other symptoms and signs involving the nervous system: Secondary | ICD-10-CM | POA: Insufficient documentation

## 2023-11-10 DIAGNOSIS — R682 Dry mouth, unspecified: Secondary | ICD-10-CM | POA: Insufficient documentation

## 2023-11-10 NOTE — Assessment & Plan Note (Signed)
Continues to have intermittent PVCs on my exam, though she is asymptomatic.  Pulse is normal today at 85 with 25 mg metoprolol succinate.  Unlikely contributing to chest discomfort as above.  Follow-up next visit with PCP.

## 2023-11-10 NOTE — Assessment & Plan Note (Signed)
STOP-BANG score of 3.  Could be contributing to dry mouth as above.  Sleep study referred.

## 2023-11-10 NOTE — Assessment & Plan Note (Signed)
Uncontrolled on amlodipine 10 mg daily.  Appears to have been on losartan in the past; will trial losartan 25 mg daily again.  Discussed returning in 1 week for BP follow-up and lab draw.

## 2023-11-10 NOTE — Assessment & Plan Note (Addendum)
Overnight dehydration and likely OSA contributing.  No likely medications contributing, though her Mounjaro could worsen dehydration.  Discussed increasing fluid intake before bed to target a urine color of pale yellow.  Also discussed sleep study as below.  Presence of Wickham striae on buccal mucosa and history of lichen planus may also be contributing.  Can consider oral steroids if refractory.  Follow-up as needed.

## 2023-11-10 NOTE — Assessment & Plan Note (Signed)
Due to musculoskeletal strain.  Reassured against cardiac pathology given resolved when changing positions, no shortness of breath, no increased PVC burden that she feels, and lack of recurrence with exertion.  Advised to return to care should symptoms worsen and persist.

## 2023-11-16 ENCOUNTER — Encounter: Payer: PPO | Admitting: Rehabilitative and Restorative Service Providers"

## 2023-11-23 ENCOUNTER — Encounter: Payer: PPO | Admitting: Rehabilitative and Restorative Service Providers"

## 2023-12-24 ENCOUNTER — Encounter: Payer: Self-pay | Admitting: Emergency Medicine

## 2023-12-24 ENCOUNTER — Ambulatory Visit: Admission: EM | Admit: 2023-12-24 | Discharge: 2023-12-24 | Discharge status: Against Medical Advice

## 2023-12-24 MED ORDER — ONDANSETRON 4 MG PO TBDP
4.0000 mg | ORAL_TABLET | Freq: Once | ORAL | Status: AC
  Administered 2023-12-24: 4 mg via ORAL

## 2023-12-24 NOTE — ED Triage Notes (Addendum)
 Pt reports dull aching abdominal pain in RUQ & RLQ after eating [employer] in the last hr. Felt fine prior to meal and stated her burger looked overcooked and black. Excessive gas and belching in last hr. No sharp pains. Mild intermittent nausea. No emesis or diarrhea. No med use for symptoms. Hx of IBS. On tirzepatide injections

## 2024-01-04 ENCOUNTER — Other Ambulatory Visit: Payer: Self-pay | Admitting: Student

## 2024-01-04 DIAGNOSIS — E119 Type 2 diabetes mellitus without complications: Secondary | ICD-10-CM

## 2024-01-10 ENCOUNTER — Encounter: Payer: Self-pay | Admitting: Pharmacist

## 2024-01-10 NOTE — Progress Notes (Signed)
 Patient chart reviewed for medication adherence review - diabetes therapy.   Patient fills for [facility] (tirzepatide) by Dr. Tiajuana Amass include;  11/26/2023 01/05/2024  Appears adherent.  No additional follow-up planned.

## 2024-01-12 ENCOUNTER — Ambulatory Visit: Admitting: Physician Assistant

## 2024-01-17 ENCOUNTER — Encounter

## 2024-01-17 ENCOUNTER — Telehealth: Payer: Self-pay

## 2024-01-17 NOTE — Telephone Encounter (Signed)
error 

## 2024-02-01 ENCOUNTER — Other Ambulatory Visit: Payer: Self-pay

## 2024-02-01 DIAGNOSIS — I10 Essential (primary) hypertension: Secondary | ICD-10-CM

## 2024-02-01 MED ORDER — AMLODIPINE BESYLATE 10 MG PO TABS: 10.0000 mg | ORAL_TABLET | Freq: Every day | ORAL | 3 refills | Status: DC

## 2024-02-09 ENCOUNTER — Encounter: Payer: Self-pay | Admitting: Student

## 2024-02-09 ENCOUNTER — Ambulatory Visit: Admitting: Student

## 2024-02-09 VITALS — BP 152/89 | HR 98 | Ht 65.0 in | Wt 199.4 lb

## 2024-02-09 DIAGNOSIS — Z7985 Long-term (current) use of injectable non-insulin antidiabetic drugs: Secondary | ICD-10-CM

## 2024-02-09 DIAGNOSIS — J019 Acute sinusitis, unspecified: Secondary | ICD-10-CM

## 2024-02-09 DIAGNOSIS — E119 Type 2 diabetes mellitus without complications: Secondary | ICD-10-CM

## 2024-02-09 DIAGNOSIS — R051 Acute cough: Secondary | ICD-10-CM | POA: Diagnosis not present

## 2024-02-09 LAB — POCT GLYCOSYLATED HEMOGLOBIN (HGB A1C): HbA1c, POC (controlled diabetic range): 7.2 % — AB (ref 0.0–7.0)

## 2024-02-09 MED ORDER — AZELASTINE HCL 0.1 % NA SOLN: 2.0000 | NASAL | 0 refills | Status: DC | PRN

## 2024-02-09 NOTE — Progress Notes (Signed)
    SUBJECTIVE:   CHIEF COMPLAINT / HPI:   Discussed the use of AI scribe software for clinical note transcription with the patient, who gave verbal consent to proceed.  History of Present Illness   Rachael Garcia is a 68 year old female with diabetes who presents with cough and runny nose.  She has experienced a cough and runny nose for three days, worsening since returning from [place] . Her nasal discharge is watery, and she has no fever. She feels fatigued and has difficulty sleeping. She has no shortness of breath but frequently clears her nose. She was exposed to smoke at a casino during her trip.  Her diabetes is managed with an [name] of 7.4. She is taking Coricidin and uses Flonase  daily, with azelastine  spray as needed. Her blood pressure is slightly elevated, attributed to her illness.  She works part-time at [employer] and lives independently. She is cautious about visiting her 13 year old mother due to her illness.        OBJECTIVE:   BP (!) 152/89   Pulse 98   Ht 5\' 5"  (1.651 m)   Wt 199 lb 6.4 oz (90.4 kg)   SpO2 98%   BMI 33.18 kg/m   Physical Exam   General: Well appearing, wears face mask HEENT: Normal oropharynx. Clear rhinorrhea. CHEST: Clear to auscultation bilaterally, no wheezes, no fluid. CARDIOVASCULAR: Regular heart rhythm.       ASSESSMENT/PLAN:   Cough Acute viral upper respiratory infection with cough, rhinorrhea, and fatigue, likely exacerbated by allergies. Symptoms peaked on day three, consistent with a typical viral course. No fever or significant respiratory distress. Differential includes viral infection with allergic rhinitis component. Discussed COVID-19 or influenza testing for potential antiviral treatment. She prefers supportive care unless symptoms worsen. - Recommend supportive care including rest and hydration. - Advise honey for cough relief. - Instruct to monitor for signs of bacterial infection such as fever or worsening symptoms after  initial improvement.         Layza Summa, DO [facility]

## 2024-02-09 NOTE — Patient Instructions (Signed)
 It was great seeing you today.  As we discussed,  You likely have a cold with allergies as well.  - I sent in Azelastine  - Honey for cough and Zarby's cough syrup - Rest, hydrate - If you get better, then get worse (fever, etc) please return to see us     If you have any questions or concerns, please feel free to call the clinic.   Have a wonderful day,  Dr. Vallorie Gayer [facility] [phone]

## 2024-02-10 NOTE — Assessment & Plan Note (Addendum)
 Acute viral upper respiratory infection with cough, rhinorrhea, and fatigue, likely exacerbated by allergies. Symptoms peaked on day three, consistent with a typical viral course. No fever or significant respiratory distress. Differential includes viral infection with allergic rhinitis component. Discussed COVID-19 or influenza testing for potential antiviral treatment. She prefers supportive care unless symptoms worsen. - Recommend supportive care including rest and hydration. - Advise honey for cough relief. - Instruct to monitor for signs of bacterial infection such as fever or worsening symptoms after initial improvement.

## 2024-02-16 ENCOUNTER — Ambulatory Visit: Admitting: Student

## 2024-02-16 NOTE — Progress Notes (Deleted)
    SUBJECTIVE:   CHIEF COMPLAINT / HPI:   ***  PERTINENT  PMH / PSH: ***  OBJECTIVE:   There were no vitals taken for this visit.  ***  ASSESSMENT/PLAN:   Assessment & Plan      Alexa Andrews, MD [facility]

## 2024-02-24 ENCOUNTER — Telehealth: Payer: Self-pay

## 2024-02-24 NOTE — Telephone Encounter (Signed)
 Patient calls nurse line voicing frustration over Sleep Study.   She reports she called the number that was provided to her at recent office visit to [facility].  She reports she was told to call us , as they have no record of "referral and/or order."  Advised will forward to referral coordinator for advisement.

## 2024-03-13 ENCOUNTER — Encounter: Payer: Self-pay | Admitting: Student

## 2024-03-13 ENCOUNTER — Other Ambulatory Visit: Payer: Self-pay | Admitting: Student

## 2024-03-13 DIAGNOSIS — E119 Type 2 diabetes mellitus without complications: Secondary | ICD-10-CM

## 2024-03-15 ENCOUNTER — Telehealth: Payer: Self-pay

## 2024-03-15 DIAGNOSIS — R29818 Other symptoms and signs involving the nervous system: Secondary | ICD-10-CM

## 2024-03-15 NOTE — Telephone Encounter (Signed)
 Patient calls nurse line voicing frustration over sleep study.   She reports she has called [facility] to have this scheduled, however was told there was an issue with the order or the order being received.   Advised will forward to referral coordinator.

## 2024-03-15 NOTE — Telephone Encounter (Signed)
 Patient calls nurse line in regards to Mounjaro.  Advised PCP reached out on mychart in regards to increasing her dosage.   Patient stated her email is not working and she has not been able to log into mychart.   Patient would like to move forward with dose increase.   Patient scheduled for Friday with PCP in [place].   She requests this be a telephone call as she is unable to log into her mychart.

## 2024-03-17 ENCOUNTER — Telehealth: Admitting: Student

## 2024-03-17 DIAGNOSIS — E119 Type 2 diabetes mellitus without complications: Secondary | ICD-10-CM | POA: Diagnosis not present

## 2024-03-17 DIAGNOSIS — Z7985 Long-term (current) use of injectable non-insulin antidiabetic drugs: Secondary | ICD-10-CM

## 2024-03-17 DIAGNOSIS — R29818 Other symptoms and signs involving the nervous system: Secondary | ICD-10-CM

## 2024-03-17 MED ORDER — TIRZEPATIDE 5 MG/0.5ML ~~LOC~~ SOAJ: 5.0000 mg | SUBCUTANEOUS | 0 refills | Status: DC

## 2024-03-17 NOTE — Progress Notes (Signed)
 Virtual Visit via Video Note  I connected with Rachael Garcia on 03/17/24 at  1:30 PM EDT by a video enabled telemedicine application and verified that I am speaking with the correct person using two identifiers.  Unfortunately video connection was lost partway through the conversation so visit was converted to telephone.   Location: Patient: In her car in [place], [place] Provider: At the [facility]    I discussed the limitations of evaluation and management by telemedicine and the availability of in person appointments. The patient expressed understanding and agreed to proceed.  History of Present Illness: Visit today to discuss her ongoing weight loss efforts with Mounjaro  which she is on for both her obesity and diabetes.  She has actually been on the 2.5 mg weekly dose for quite some time now, discussion today regarding the possibility of a dose increase.  She expresses some disappointment that she has only lost 1 pound over several months of taking the Mounjaro , though she is pleased to share that she has not had any side effects. She is due for labs for her DM.    Observations/Objective: Patient is seen in her car.  She is well-appearing, speaking in full sentences.  Mood and affect are appropriate.  Assessment and Plan: Assessment & Plan Type 2 diabetes mellitus without complication, without long-term current use of insulin (HCC) Most recent [name] 7.2%.  She has room for improvement in terms of the weight loss benefit from her Mounjaro , therefore we will proceed with dose increase to 2 5 mg weekly.  She is overdue for a GFR and urine albumin and creatinine ratio.  Advised to make an in person appointment in July so that she can meet her new PCP and get her lab work taken care of.  She is on an ARB and a statin. - Increase mounjaro  - follow-up in person next month for labs Morbid obesity (HCC) Only 1 lb weight loss since starting Mounjaro , but this is expected on  such a low-dose. She is tolerating well.  - Increase Mounjaro  to 5mg  weekly    I provided 9 minutes of non-face-to-face time during this encounter.   Alexa Andrews, MD

## 2024-03-18 NOTE — Assessment & Plan Note (Signed)
 Most recent [name] 7.2%.  She has room for improvement in terms of the weight loss benefit from her Mounjaro , therefore we will proceed with dose increase to 2 5 mg weekly.  She is overdue for a GFR and urine albumin and creatinine ratio.  Advised to make an in person appointment in July so that she can meet her new PCP and get her lab work taken care of.  She is on an ARB and a statin. - Increase mounjaro  - follow-up in person next month for labs

## 2024-03-21 ENCOUNTER — Encounter: Payer: Self-pay | Admitting: *Deleted

## 2024-03-24 ENCOUNTER — Telehealth: Payer: Self-pay

## 2024-03-24 NOTE — Telephone Encounter (Signed)
 Patient calls nurse line regarding issues with prescription of Mounjaro .   Called pharmacy. Verified that they received prescription and that rx does not need prior authorization.   Pharmacist reports that patient needs to respond to a text message, via app or phone call that she plans on proceeding with prescription.   This is a new protocol due to limited availability of medication.   Called patient and provided with update. Patient appreciative.   Elsie Halo, RN

## 2024-04-03 NOTE — Addendum Note (Signed)
 Addended by: THARON LUNG B on: 04/03/2024 01:19 PM   Modules accepted: Orders

## 2024-04-03 NOTE — Telephone Encounter (Signed)
 Dr Tharon will you put an order in for the sleep study?  That way it will go into their Que.  Thanks! Margit

## 2024-04-03 NOTE — Telephone Encounter (Signed)
 Order for split night study placed given patient [facility] where [facility] was previously sent.

## 2024-04-05 NOTE — Progress Notes (Signed)
<   50% of the total visit was conducted with video software enabled due to technology issues.

## 2024-04-12 ENCOUNTER — Encounter: Payer: Self-pay | Admitting: Podiatry

## 2024-04-12 ENCOUNTER — Ambulatory Visit (INDEPENDENT_AMBULATORY_CARE_PROVIDER_SITE_OTHER)

## 2024-04-12 ENCOUNTER — Ambulatory Visit (INDEPENDENT_AMBULATORY_CARE_PROVIDER_SITE_OTHER): Admitting: Podiatry

## 2024-04-12 DIAGNOSIS — E119 Type 2 diabetes mellitus without complications: Secondary | ICD-10-CM

## 2024-04-12 DIAGNOSIS — M7752 Other enthesopathy of left foot: Secondary | ICD-10-CM | POA: Diagnosis not present

## 2024-04-12 DIAGNOSIS — M205X2 Other deformities of toe(s) (acquired), left foot: Secondary | ICD-10-CM

## 2024-04-12 NOTE — Progress Notes (Signed)
  Subjective:  Patient ID: Rachael Garcia, female    DOB: 1956-09-11,   MRN: [id number]  Chief Complaint  Patient presents with   Foot Pain    RM#21 Left foot pain prior to visit felt heavy like she had fluid in it more like a numb pain. Previous injury 3-4 months ago. Today feels better but kept appt.    67 y.o. female presents for concern of left foot pain that has been ongoing for several months. She relates she had an injury around 3 months ago. She also has a history of arthritis Relates a feeling of heaviness and numbness in the foot. It is doing a lot better but wanted to still check on things.   . Denies any other pedal complaints. Denies n/v/f/c.   Past Medical History:  Diagnosis Date   Allergy     Anemia    Anniversary reaction 12/14/2019   Anxiety    Arthritis    Blood dyscrasia    sickle cell trait   Complication of anesthesia    o2 sat drop with epidural for  c-section surgery ;  subsequent surgeries no problems   Diabetes (HCC)    TYPE 2 ( 3 YEARS AGO)   Diabetes mellitus without complication (HCC)    06-2016 had hga1c of 7 prescribed metformin; returned for repeat a1c that was a 5.? ; does not take any diabetes medication currently    Fibroids    uterine   GERD (gastroesophageal reflux disease)    Heart murmur    i've always had this    Hyperlipidemia    Hypertension    IBS (irritable bowel syndrome)    Irritable bowel syndrome 08/13/2010   Qualifier: Diagnosis of  By: Genie CMA (AAMA), Leisha     Kidney stone    Lichen planus    Shoulder pain, right 01/11/2020   Upper airway cough syndrome 06/01/2017   Max rx for gerd plus 1st gen h1  06/01/2017 >>>  - Allergy  profile 06/01/2017 >  Eos 0.2 /  IgE  64 PAST pos cat grass tree     Objective:  Physical Exam: Vascular: DP/PT pulses 2/4 bilateral. CFT <3 seconds. Normal hair growth on digits. No edema.  Skin. No lacerations or abrasions bilateral feet.  Musculoskeletal: MMT 5/5 bilateral lower extremities in DF, PF,  Inversion and Eversion. Deceased ROM in DF of ankle joint. No major tenderness to palpation. Some around the first MPJ on the left and mild pain with ROM of the first MPJ.  Neurological: Sensation intact to light touch.   Assessment:   1. Hallux limitus, left   2. Type 2 diabetes mellitus without complication, without long-term current use of insulin (HCC)      Plan:  Patient was evaluated and treated and all questions answered. -Xrays reviewed. No acute fractures or dislocations noted. Mild joint space narrowing of the first MPJ and mild degenerative changes noted.  -Discussed hallux limitus and  treatement options; conservative and  Surgical management; risks, benefits, alternatives discussed. All patient's questions answered. -For now continue to monitor.  -Recommend continue with good supportive shoes and inserts. Discussed stiff soled shoes and use of carbon fiber foot plate.   -Patient to return to office as needed or sooner if condition worsens.   Asberry Failing, DPM

## 2024-04-18 ENCOUNTER — Other Ambulatory Visit: Payer: Self-pay

## 2024-04-18 MED ORDER — TIRZEPATIDE 5 MG/0.5ML ~~LOC~~ SOAJ
5.0000 mg | SUBCUTANEOUS | 0 refills | Status: DC
Start: 2024-04-18 — End: 2024-06-19

## 2024-04-18 NOTE — Telephone Encounter (Signed)
 Patient calls nurse line requesting refill on Mounjaro  5 mg injection.   She states that she has been tolerating well, no adverse side effects.   Patient voices frustration that rx has not been called in with refills and only for a 28 day supply. Explained to patient that this drug is typically titrated and that is the reason for sending in monthly supply.   Patient is requesting that she continue on 5 mg and will let us  know if there are any changes or if medication stops working.   Pended refill request for 84 day supply.   Chiquita JAYSON English, RN

## 2024-04-21 ENCOUNTER — Ambulatory Visit (INDEPENDENT_AMBULATORY_CARE_PROVIDER_SITE_OTHER)

## 2024-04-21 VITALS — BP 136/72 | HR 84 | Wt 198.6 lb

## 2024-04-21 DIAGNOSIS — I499 Cardiac arrhythmia, unspecified: Secondary | ICD-10-CM

## 2024-04-21 DIAGNOSIS — L439 Lichen planus, unspecified: Secondary | ICD-10-CM

## 2024-04-21 DIAGNOSIS — E119 Type 2 diabetes mellitus without complications: Secondary | ICD-10-CM

## 2024-04-21 DIAGNOSIS — Z1321 Encounter for screening for nutritional disorder: Secondary | ICD-10-CM | POA: Diagnosis not present

## 2024-04-21 LAB — POCT GLYCOSYLATED HEMOGLOBIN (HGB A1C): HbA1c, POC (controlled diabetic range): 6 % (ref 0.0–7.0)

## 2024-04-21 NOTE — Assessment & Plan Note (Signed)
 Pt with lichen planus, photo above. Hoping to get in to our dermatology clinic. - Referral placed

## 2024-04-21 NOTE — Patient Instructions (Addendum)
 Dear Nena JONETTA Childes,  It was great seeing you in clinic today! We talked about your diabetes and getting lab work.  Continue taking your Mounjaro  at its current 5 mg weekly We are getting labs done today; we will call you with any concerns, otherwise, you can review your labs in Mychart when your results return.  Thank you for allowing me to be a part of your care! Alan Flies, MD [facility]

## 2024-04-21 NOTE — Progress Notes (Cosign Needed Addendum)
    SUBJECTIVE:   CHIEF COMPLAINT / HPI:   Rachael Garcia is a 68 y.o. female presenting for check-up of her DM.  DM Home regimen includes Mounjaro  5 mg weekly; increased from 2.5 to 5 in 03/2024. Last Ha1c in 01/2024 of 7.2%. Also on losartan  25 mg daily, Lipitor 40 mg daily. She reports she has adjusted well to the Mounjaro ; was originally slightly queasy with this, since resolved. She reports a slight curb to her appetite. Not interested in increasing dose at this time.  Lichen Planus Pt reports longstanding lichen planus of bilateral legs. Had been seeing another dermatologist for this, interested in being seen at our clinic.  PERTINENT  PMH / PSH: HTN, T2DM, HLD  OBJECTIVE:   BP (!) 131/97   Pulse 84   Wt 198 lb 9.6 oz (90.1 kg)   SpO2 96%   BMI 33.05 kg/m   General: Pt seated in chair, no distress. Cardiovascular: Regular rhythm with occasional extra beats, no murmurs/rubs/gallops. Respiratory: Normal work of breathing on room air. Clear to auscultation bilaterally; no wheezes, crackles. Extremities: No bilateral lower extremity edema. No calf pain bilaterally. Bilateral dark raised lesions of bilateral legs; see image below    ASSESSMENT/PLAN:   Assessment & Plan Type 2 diabetes mellitus without complication, without long-term current use of insulin (HCC) Pt doing well on current Mounjaro  5 mg weekly. - Continue current regimen - Labs: HA1c, lipid panel, CMP, Urine albumin/Cr ratio Encounter for vitamin deficiency screening Per pt's request, will screen for vitamin D  deficiency. She has been taking cholecalciferol 5000U - Labs: Vitamin D  Lichen planus Pt with lichen planus, photo above. Hoping to get in to our dermatology clinic. - Referral placed Irregular heart beat Pt with occasional extra beats on exam. Known history of PVC/PACs with prior monitors. Asymptomatic during episodes. - Will have patient return for EKG   Alan Flies, MD [facility]

## 2024-04-21 NOTE — Assessment & Plan Note (Signed)
 Pt doing well on current Mounjaro  5 mg weekly. - Continue current regimen - Labs: HA1c, lipid panel, CMP, Urine albumin/Cr ratio

## 2024-04-22 ENCOUNTER — Ambulatory Visit: Payer: Self-pay

## 2024-04-22 DIAGNOSIS — E785 Hyperlipidemia, unspecified: Secondary | ICD-10-CM

## 2024-04-22 DIAGNOSIS — E119 Type 2 diabetes mellitus without complications: Secondary | ICD-10-CM

## 2024-04-22 LAB — COMPREHENSIVE METABOLIC PANEL WITH GFR
ALT: 16 IU/L (ref 0–32)
AST: 24 IU/L (ref 0–40)
Albumin: 4.3 g/dL (ref 3.9–4.9)
Alkaline Phosphatase: 61 IU/L (ref 44–121)
BUN/Creatinine Ratio: 17 (ref 12–28)
BUN: 14 mg/dL (ref 8–27)
Bilirubin Total: 0.3 mg/dL (ref 0.0–1.2)
CO2: 24 mmol/L (ref 20–29)
Calcium: 9.4 mg/dL (ref 8.7–10.3)
Chloride: 105 mmol/L (ref 96–106)
Creatinine, Ser: 0.82 mg/dL (ref 0.57–1.00)
Globulin, Total: 2.6 g/dL (ref 1.5–4.5)
Glucose: 84 mg/dL (ref 70–99)
Potassium: 4.8 mmol/L (ref 3.5–5.2)
Sodium: 144 mmol/L (ref 134–144)
Total Protein: 6.9 g/dL (ref 6.0–8.5)
eGFR: 78 mL/min/1.73 (ref >59)

## 2024-04-22 LAB — LIPID PANEL
Chol/HDL Ratio: 3.6 ratio (ref 0.0–4.4)
Cholesterol, Total: 244 mg/dL — ABNORMAL HIGH (ref 100–199)
HDL: 67 mg/dL (ref >39)
LDL Chol Calc (NIH): 150 mg/dL — ABNORMAL HIGH (ref 0–99)
Triglycerides: 154 mg/dL — ABNORMAL HIGH (ref 0–149)
VLDL Cholesterol Cal: 27 mg/dL (ref 5–40)

## 2024-04-22 LAB — MICROALBUMIN / CREATININE URINE RATIO
Creatinine, Urine: 142 mg/dL
Microalb/Creat Ratio: 17 mg/g{creat} (ref 0–29)
Microalbumin, Urine: 24 ug/mL

## 2024-04-22 LAB — VITAMIN D 25 HYDROXY (VIT D DEFICIENCY, FRACTURES): Vit D, 25-Hydroxy: 40.7 ng/mL (ref 30.0–100.0)

## 2024-04-22 MED ORDER — ATORVASTATIN CALCIUM 40 MG PO TABS: 40.0000 mg | ORAL_TABLET | Freq: Every day | ORAL | 3 refills | Status: AC

## 2024-04-25 ENCOUNTER — Telehealth: Payer: Self-pay

## 2024-04-25 NOTE — Telephone Encounter (Signed)
 Called patient about elevated LDL of 150 seen on recent lipid panel; given patient's history of T2DM and elevated cardiovascular risk, starting her on high dose atorvastatin  40 mg daily; this has been sent to her pharmacy.  Also spoke with patient about her extra beats heard on recent exam. Per patient, she has a history of extra heartbeats and has worn a couple monitors previously with no findings other than PVCs/PACs. She denies symptoms with these extra beats. She is amenable to coming in to get an EKG done to make sure these extra beats have not turned into anything else, such as A Fib.  Finally, patient mentioned that she had been waiting on a sleep study to be sent in from this clinic, but it had been months and she had not heard anything, and when she tried to call [facility] to set this up, they told her she did not have an order. She has now gotten this ordered through her dentist, and wanted to make the clinic aware of this.  Admin team, could you please get this patient scheduled for an office visit to check EKG? Thanks!

## 2024-04-28 ENCOUNTER — Telehealth: Payer: Self-pay

## 2024-04-28 NOTE — Telephone Encounter (Signed)
 Called patient to schedule, LVM to call back.   Appointment Notes: check EKG   Thanks!

## 2024-05-22 ENCOUNTER — Ambulatory Visit

## 2024-05-22 VITALS — BP 133/65 | HR 93 | Ht 65.0 in | Wt 199.4 lb

## 2024-05-22 DIAGNOSIS — I493 Ventricular premature depolarization: Secondary | ICD-10-CM | POA: Diagnosis not present

## 2024-05-22 DIAGNOSIS — J019 Acute sinusitis, unspecified: Secondary | ICD-10-CM | POA: Diagnosis not present

## 2024-05-22 DIAGNOSIS — E119 Type 2 diabetes mellitus without complications: Secondary | ICD-10-CM | POA: Diagnosis not present

## 2024-05-22 DIAGNOSIS — I499 Cardiac arrhythmia, unspecified: Secondary | ICD-10-CM | POA: Diagnosis not present

## 2024-05-22 DIAGNOSIS — L439 Lichen planus, unspecified: Secondary | ICD-10-CM

## 2024-05-22 MED ORDER — FLUTICASONE PROPIONATE 50 MCG/ACT NA SUSP
2.0000 | Freq: Every day | NASAL | 6 refills | Status: AC
Start: 2024-05-22 — End: ?

## 2024-05-22 MED ORDER — FREESTYLE LIBRE 3 PLUS SENSOR MISC: 0 refills | Status: AC

## 2024-05-22 NOTE — Patient Instructions (Addendum)
 Dear Rachael Garcia,   It was great seeing you in clinic today! You came in for follow-up of heart palpitations. An EKG done today showed Premature Ventricular Contractions, or PVCs, which are extra heartbeats that are not harmful.  There are a few things for you to do outside of clinic: - If you start experienced prolonged feelings of your heart beating rapidly or abnormally, or feeling increased fatigue, please call the clinic or go to an Urgent Care for an EKG - Consider taking metoprolol  succinate 25 mg daily to help with both the PVCs and blood pressure - Keep taking your statin! We will recheck your cholesterol level in a year - Schedule your sleep study - I am placing a referral to the [facility] for you to be seen here; keep your other dermatology appointment as well  Thank you for allowing me to be a part of your care team! Alan Flies, MD [facility] [address] [place], [place], [place] [address] [phone]

## 2024-05-22 NOTE — Progress Notes (Signed)
    SUBJECTIVE:   CHIEF COMPLAINT / HPI:   Rachael Garcia is a 68 y.o. female presenting for follow-up of heart palpitations.  Heart Palpitations Occasional twinges in chest, not really pain, last a brief moment. None presently during visit or EKG. No shortness of breath, no chest pain. No prolonged episodes of rapid heart beat, odd heart beat, feeling increased fatigue. No other concerns today.  Care Gaps - Medicare AWV - Scheduled for 08/24/2024 - Taking statin - Yes! - Sleep study - Has received the number to schedule - Lichen planus - Appointment in 10/2023 with outpatient derm; no appointment with our derm clinic  PERTINENT  PMH / PSH: Frequent PVCs, T2DM, HTN, suspected sleep apnea  OBJECTIVE:   BP 133/65   Pulse 93   Ht 5' 5 (1.651 m)   Wt 199 lb 6.4 oz (90.4 kg)   SpO2 97%   BMI 33.18 kg/m   Cardiovascular: Regular rate and rhythm, no murmurs/rubs/gallops. Respiratory: Normal work of breathing on room air. Clear to auscultation bilaterally; no wheezes, crackles.  EKG: NSR with occasional PVCs.  ASSESSMENT/PLAN:   Assessment & Plan Irregular heart beat Frequent PVCs EKG done today in clinic showed occasional PVCs, otherwise normal. No further workup indicated. - Can take metoprolol  succinate 25 mg daily or as needed for PVCs - A fib precautions given  Flonase  refilled. Switched Glucose monitoring per pharmacy recommendation. Referral placed to family medicine derm clinic.   Alan Flies, MD [facility]

## 2024-05-22 NOTE — Assessment & Plan Note (Addendum)
 EKG done today in clinic showed occasional PVCs, otherwise normal. No further workup indicated. - Can take metoprolol  succinate 25 mg daily or as needed for PVCs - A fib precautions given

## 2024-06-19 ENCOUNTER — Other Ambulatory Visit: Payer: Self-pay

## 2024-06-19 ENCOUNTER — Encounter: Payer: Self-pay | Admitting: Pharmacist

## 2024-06-19 NOTE — Progress Notes (Signed)
 Chart accessed for medication adherence review.   Patient filled Mounjaro  (tirzepatide ) 0.5 mg refilled today by PCP.

## 2024-07-20 NOTE — Progress Notes (Unsigned)
    SUBJECTIVE:   CHIEF COMPLAINT / HPI:   Rachael Garcia is a 68 y.o. female presenting with cough and chest pain.  Cough ***  Chest Pain ***  Healthcare Maintenance: - Flu shot - COVID booster - Shingrix vaccine - Pneumococcal vaccine - Diabetic eye exam due 08/2024 - Colonoscopy due 11/2024  PERTINENT  PMH / PSH: HTN, frequent PVCs, asthma, GERD, T2DM, HLD, upper airway cough syndrome  OBJECTIVE:   There were no vitals taken for this visit.  ***  ASSESSMENT/PLAN:   Assessment & Plan      Alan Flies, MD [facility]

## 2024-07-21 ENCOUNTER — Ambulatory Visit

## 2024-07-27 ENCOUNTER — Encounter: Payer: Self-pay | Admitting: Family Medicine

## 2024-07-27 ENCOUNTER — Ambulatory Visit (INDEPENDENT_AMBULATORY_CARE_PROVIDER_SITE_OTHER): Admitting: Family Medicine

## 2024-07-27 VITALS — BP 126/93 | HR 87 | Ht 65.0 in | Wt 194.0 lb

## 2024-07-27 DIAGNOSIS — R052 Subacute cough: Secondary | ICD-10-CM

## 2024-07-27 MED ORDER — PANTOPRAZOLE SODIUM 40 MG PO TBEC
40.0000 mg | DELAYED_RELEASE_TABLET | Freq: Every day | ORAL | 0 refills | Status: AC
Start: 2024-07-27 — End: 2024-08-10

## 2024-07-27 NOTE — Patient Instructions (Signed)
 Good to see you today - Thank you for coming in  Things we discussed today:  Please take pantoprazole  daily for the next 2 weeks.  Follow-up with your PCP in 2 weeks to see if her cough is improved.  I have ordered a chest x-ray to make sure there is nothing in your lungs causing the cough. Go to [address] to have this completed.   I have attached some rotator cuff exercises for your shoulder.

## 2024-07-27 NOTE — Assessment & Plan Note (Signed)
 Given history sounds like it could be related to GERD.  Will trial pantoprazole  40 mg daily for 2 weeks. If cough does not improve, could consider formal PFTs/trial of albuterol  Will obtain chest x-ray to assess for any lung abnormality causing the cough since it has been going on for 1 month

## 2024-07-27 NOTE — Progress Notes (Signed)
    SUBJECTIVE:   CHIEF COMPLAINT / HPI:   Reports productive cough x 1 month, intermittent chest tightness/burning Symptoms are worse in the morning when she wakes up Denies history of asthma, COPD, or heart disease Former smoker 30 years ago No other sick symptoms or contacts Has been doing nosespray/antihistamine with no improvement  PERTINENT  PMH / PSH: asthma (no documented PFTs), GERD, allergic rhinitis, HTN, T2DM, upper airway cough syndrome  OBJECTIVE:   BP (!) 126/93   Pulse 87   Ht 5' 5 (1.651 m)   Wt 194 lb (88 kg)   SpO2 100%   BMI 32.28 kg/m   General: well appearing, NAD Cardiovascular: RRR, no m/r/g Respiratory: normal work of breathing on RA, CTAB Extremities: No swelling BLE  ASSESSMENT/PLAN:   Assessment & Plan Subacute cough Given history sounds like it could be related to GERD.  Will trial pantoprazole  40 mg daily for 2 weeks. If cough does not improve, could consider formal PFTs/trial of albuterol  Will obtain chest x-ray to assess for any lung abnormality causing the cough since it has been going on for 1 month     Rachael Prescott, DO [facility]

## 2024-08-14 ENCOUNTER — Encounter: Payer: Self-pay | Admitting: Radiology

## 2024-08-22 LAB — OPHTHALMOLOGY REPORT-SCANNED

## 2024-08-24 ENCOUNTER — Ambulatory Visit

## 2024-08-24 VITALS — Ht 65.0 in | Wt 190.0 lb

## 2024-08-24 DIAGNOSIS — Z0000 Encounter for general adult medical examination without abnormal findings: Secondary | ICD-10-CM | POA: Diagnosis not present

## 2024-08-24 NOTE — Patient Instructions (Signed)
 Ms. Latino,  Thank you for taking the time for your Medicare Wellness Visit. I appreciate your continued commitment to your health goals. Please review the care plan we discussed, and feel free to reach out if I can assist you further.  Please note that Annual Wellness Visits do not include a physical exam. Some assessments may be limited, especially if the visit was conducted virtually. If needed, we may recommend an in-person follow-up with your provider.  Ongoing Care Seeing your primary care provider every 3 to 6 months helps us  monitor your health and provide consistent, personalized care.   Referrals If a referral was made during today's visit and you haven't received any updates within two weeks, please contact the referred provider directly to check on the status.  Recommended Screenings:  Health Maintenance  Topic Date Due   Zoster (Shingles) Vaccine (1 of 2) Never done   Medicare Annual Wellness Visit  02/26/2024   Flu Shot  Never done   COVID-19 Vaccine (1 - 2025-26 season) Never done   Eye exam for diabetics  08/19/2024   Colon Cancer Screening  11/16/2024   Pneumococcal Vaccine for age over 107 (1 of 2 - PCV) 08/25/2024*   Hemoglobin [name]  10/22/2024   Breast Cancer Screening  04/15/2025   Yearly kidney function blood test for diabetes  04/21/2025   Yearly kidney health urinalysis for diabetes  04/21/2025   DTaP/Tdap/Td vaccine (3 - Td or Tdap) 06/30/2025   DEXA scan (bone density measurement)  Completed   Hepatitis C Screening  Completed   Meningitis B Vaccine  Aged Out   Complete foot exam   Discontinued  *Topic was postponed. The date shown is not the original due date.       08/24/2024    2:56 PM  Advanced Directives  Does Patient Have a Medical Advance Directive? No  Would patient like information on creating a medical advance directive? No - Patient declined    Vision: Annual vision screenings are recommended for early detection of glaucoma, cataracts, and  diabetic retinopathy. These exams can also reveal signs of chronic conditions such as diabetes and high blood pressure.  Dental: Annual dental screenings help detect early signs of oral cancer, gum disease, and other conditions linked to overall health, including heart disease and diabetes.  Please see the attached documents for additional preventive care recommendations.

## 2024-08-24 NOTE — Progress Notes (Cosign Needed Addendum)
 I connected with  Rachael Garcia on 08/24/24 by a audio enabled telemedicine application and verified that I am speaking with the correct person using two identifiers.  Patient Location: Home  Provider Location: Home Office  Persons Participating in Visit: Patient.  I discussed the limitations of evaluation and management by telemedicine. The patient expressed understanding and agreed to proceed.   Vital Signs: Because this visit was a virtual/telehealth visit, some criteria may be missing or patient reported. Any vitals not documented were not able to be obtained and vitals that have been documented are patient reported.     Chief Complaint  Patient presents with   Medicare Wellness    SUBSEQUENT     Subjective:   Rachael Garcia is a 68 y.o. female who presents for a Medicare Annual Wellness Visit.  Allergies (verified) Codeine, Erythromycin, and Sulfonamide derivatives   History: Past Medical History:  Diagnosis Date   Allergy     Anemia    Anniversary reaction 12/14/2019   Anxiety    Arthritis    Blood dyscrasia    sickle cell trait   Complication of anesthesia    o2 sat drop with epidural for  c-section surgery ;  subsequent surgeries no problems   Diabetes (HCC)    TYPE 2 ( 3 YEARS AGO)   Diabetes mellitus without complication (HCC)    06-2016 had hga1c of 7 prescribed metformin; returned for repeat a1c that was a 5.? ; does not take any diabetes medication currently    Fibroids    uterine   GERD (gastroesophageal reflux disease)    Heart murmur    i've always had this    Hyperlipidemia    Hypertension    IBS (irritable bowel syndrome)    Irritable bowel syndrome 08/13/2010   Qualifier: Diagnosis of  By: Genie CMA (AAMA), Leisha     Kidney stone    Lichen planus    Shoulder pain, right 01/11/2020   Upper airway cough syndrome 06/01/2017   Max rx for gerd plus 1st gen h1  06/01/2017 >>>  - Allergy  profile 06/01/2017 >  Eos 0.2 /  IgE  64 PAST pos cat grass  tree    Past Surgical History:  Procedure Laterality Date   CARPAL TUNNEL RELEASE Right 03   CARPAL TUNNEL RELEASE  08/20/2011   Procedure: CARPAL TUNNEL RELEASE;  Surgeon: Lamar LULLA Leonor Mickey., MD;  Location: [facility];  Service: Orthopedics;  Laterality: Left;   CESAREAN SECTION  83,92   x 2   COLONOSCOPY     DIAGNOSTIC LAPAROSCOPY     ovarian cyst removal   DILATION AND CURETTAGE OF UTERUS     MASS EXCISION Right 07/03/2015   Procedure: EXCISION RIGHT LONG FINGER MASS;  Surgeon: Donnice Robinsons, MD;  Location: [facility];  Service: Orthopedics;  Laterality: Right;   ROBOTIC ASSISTED BILATERAL SALPINGO OOPHERECTOMY Bilateral 06/29/2017   Procedure: XI ROBOTIC ASSISTED BILATERAL SALPINGO OOPHORECTOMY WITH PERITONEAL WASHINGS;  Surgeon: Eloy Herring, MD;  Location: [facility];  Service: Gynecology;  Laterality: Bilateral;   TUBAL LIGATION     Family History  Problem Relation Age of Onset   Arthritis Mother    Anemia Mother    Hypertension Mother    Hypertension Father    Alcohol abuse Father    Heart disease Father 46       defibrillator and pacemaker   Hyperlipidemia Father    Diabetes Sister    Stroke Sister  Multiple myeloma Maternal Grandmother    Stomach cancer Paternal Grandfather    Diabetes Brother    Cancer Brother        Throat   Colon cancer Neg Hx    Breast cancer Neg Hx    Social History   Occupational History   Occupation: Best Boy: [employer]   Occupation: Retired  Tobacco Use   Smoking status: Former    Current packs/day: 0.00    Types: Cigarettes    Start date: 07/13/2003    Quit date: 07/13/2011    Years since quitting: 13.1    Passive exposure: Past   Smokeless tobacco: Never  Vaping Use   Vaping status: Never Used  Substance and Sexual Activity   Alcohol use: Yes    Alcohol/week: 3.0 standard drinks of alcohol    Types: 3 Glasses of wine per week    Comment:  OCCASIONALLY WINE   Drug use: No   Sexual activity: Not Currently    Birth control/protection: Post-menopausal    Comment: 1st intercourse 68 yo-More than 5 partners   Tobacco Counseling Counseling given: Not Answered  SDOH Screenings   Food Insecurity: No Food Insecurity (08/24/2024)  Housing: Low Risk  (08/24/2024)  Transportation Needs: No Transportation Needs (08/24/2024)  Utilities: Not At Risk (08/24/2024)  Alcohol Screen: Low Risk  (02/28/2023)  Depression (PHQ2-9): Low Risk  (08/24/2024)  Financial Resource Strain: Low Risk  (02/28/2023)  Physical Activity: Insufficiently Active (08/24/2024)  Social Connections: Socially Integrated (08/24/2024)  Stress: No Stress Concern Present (08/24/2024)  Tobacco Use: Medium Risk (08/24/2024)  Health Literacy: Adequate Health Literacy (08/24/2024)   See flowsheets for full screening details  Depression Screen Depression Screening Exception Documentation Depression Screening Exception:: Patient refusal  PHQ 2 & 9 Depression Scale- Over the past 2 weeks, how often have you been bothered by any of the following problems? Little interest or pleasure in doing things: 0 Feeling down, depressed, or hopeless (PHQ Adolescent also includes...irritable): 0 PHQ-2 Total Score: 0 Trouble falling or staying asleep, or sleeping too much: 1 (SLEEP STUDY SCHEDULED 09/2024 DUE TO SNORING) Feeling tired or having little energy: 0 Poor appetite or overeating (PHQ Adolescent also includes...weight loss): 0 Feeling bad about yourself - or that you are a failure or have let yourself or your family down: 0 Trouble concentrating on things, such as reading the newspaper or watching television (PHQ Adolescent also includes...like school work): 0 (BSE: READING, PAINT, CRAFTS, GAMES ON THE PHONE) Moving or speaking so slowly that other people could have noticed. Or the opposite - being so fidgety or restless that you have been moving around a lot more than usual:  0 Thoughts that you would be better off dead, or of hurting yourself in some way: 0 PHQ-9 Total Score: 1     Goals Addressed             This Visit's Progress    08/24/2024: To keep feeling goog and staying healthy.         Visit info / Clinical Intake: Medicare Wellness Visit Type:: Subsequent Annual Wellness Visit Persons participating in visit:: patient Medicare Wellness Visit Mode:: Telephone If telephone:: video declined Because this visit was a virtual/telehealth visit:: pt reported vitals If Telephone or Video please confirm:: I connected with the patient using audio enabled telemedicine application and verified that I am speaking with the correct person using two identifiers; I discussed the limitations of evaluation and management by telemedicine; The  patient expressed understanding and agreed to proceed Patient Location:: WORK Provider Location:: HOME OFFICE Information given by:: patient Interpreter Needed?: No Pre-visit prep was completed: yes AWV questionnaire completed by patient prior to visit?: no Living arrangements:: lives with spouse/significant other Patient's Overall Health Status Rating: good Typical amount of pain: none Does pain affect daily life?: no Are you currently prescribed opioids?: no  Dietary Habits and Nutritional Risks How many meals a day?: 2 Eats fruit and vegetables daily?: yes Most meals are obtained by: preparing own meals In the last 2 weeks, have you had any of the following?: none Diabetic:: (!) yes Any non-healing wounds?: no How often do you check your BS?: 0 Would you like to be referred to a Nutritionist or for Diabetic Management? : no  Functional Status Activities of Daily Living (to include ambulation/medication): Independent Ambulation: Independent with device- listed below Home Assistive Devices/Equipment: Eyeglasses Medication Administration: Independent Home Management: Independent Manage your own finances?:  yes Primary transportation is: driving Concerns about vision?: no *vision screening is required for WTM* Concerns about hearing?: no  Fall Screening Falls in the past year?: 0 Number of falls in past year: 0 Was there an injury with Fall?: 0 Fall Risk Category Calculator: 0 Patient Fall Risk Level: Low Fall Risk  Fall Risk Patient at Risk for Falls Due to: No Fall Risks Fall risk Follow up: Falls evaluation completed; Education provided  Home and Transportation Safety: All rugs have non-skid backing?: yes All stairs or steps have railings?: yes Grab bars in the bathtub or shower?: yes Have non-skid surface in bathtub or shower?: yes Good home lighting?: yes Regular seat belt use?: yes Hospital stays in the last year:: no  Cognitive Assessment Difficulty concentrating, remembering, or making decisions? : no Will 6CIT or Mini Cog be Completed: no 6CIT or Mini Cog Declined: patient alert, oriented, able to answer questions appropriately and recall recent events  Advance Directives (For Healthcare) Does Patient Have a Medical Advance Directive?: No Would patient like information on creating a medical advance directive?: No - Patient declined  Reviewed/Updated  Reviewed/Updated: Reviewed All (Medical, Surgical, Family, Medications, Allergies, Care Teams, Patient Goals)        Objective:    Today's Vitals   08/24/24 1454  Weight: 190 lb (86.2 kg)  Height: 5' 5 (1.651 m)  PainSc: 0-No pain   Body mass index is 31.62 kg/m.  Current Medications (verified) Outpatient Encounter Medications as of 08/24/2024  Medication Sig   amLODipine  (NORVASC ) 10 MG tablet Take 1 tablet (10 mg total) by mouth daily.   Ascorbic Acid (VITAMIN C GUMMIES PO) Take by mouth daily.   atorvastatin  (LIPITOR) 40 MG tablet Take 1 tablet (40 mg total) by mouth daily.   azelastine  (ASTELIN ) 0.1 % nasal spray Place 2 sprays into both nostrils as needed.   blood glucose meter kit and supplies KIT  Dispense based on patient and insurance preference. Use up to four times daily as directed.   Blood Glucose Monitoring Suppl (ONETOUCH VERIO) w/Device KIT Test daily as needed and when you feel your blood sugar is low.   Cholecalciferol (VITAMIN D  PO) Take 5,000 Units by mouth.   Continuous Glucose Sensor (FREESTYLE LIBRE 3 PLUS SENSOR) MISC Change sensor every 15 days.   fluticasone  (FLONASE ) 50 MCG/ACT nasal spray Place 2 sprays into both nostrils daily.   losartan  (COZAAR ) 25 MG tablet Take 1 tablet (25 mg total) by mouth at bedtime. (Patient not taking: Reported on 05/22/2024)   MAGNESIUM  GLYCINATE  PO Take by mouth.   metoprolol  succinate (TOPROL -XL) 25 MG 24 hr tablet Take 1 tablet (25 mg total) by mouth daily. (Patient not taking: Reported on 05/22/2024)   Misc Natural Products (BEET ROOT PO) Take 1 tablet by mouth daily.   pantoprazole  (PROTONIX ) 40 MG tablet Take 1 tablet (40 mg total) by mouth daily for 14 days.   tirzepatide  (MOUNJARO ) 5 MG/0.5ML Pen INJECT 5 MG SUBCUTANEOUSLY WEEKLY   zinc gluconate 50 MG tablet Take 50 mg by mouth daily. Client states she takes 1/2 tablet.   No facility-administered encounter medications on file as of 08/24/2024.   Hearing/Vision screen Hearing Screening - Comments:: Patient denies any hearing difficulties.  Vision Screening - Comments:: Wears rx glasses - up to date with routine eye exams with [facility]  Immunizations and Health Maintenance Health Maintenance  Topic Date Due   Zoster Vaccines- Shingrix (1 of 2) Never done   Influenza Vaccine  Never done   COVID-19 Vaccine (1 - 2025-26 season) Never done   OPHTHALMOLOGY EXAM  08/19/2024   Colonoscopy  11/16/2024   Pneumococcal Vaccine: 50+ Years (1 of 2 - PCV) 08/25/2024 (Originally 06/16/1975)   HEMOGLOBIN [name]  10/22/2024   Mammogram  04/15/2025   Diabetic kidney evaluation - eGFR measurement  04/21/2025   Diabetic kidney evaluation - Urine ACR  04/21/2025   DTaP/Tdap/Td (3 - Td or  Tdap) 06/30/2025   Medicare Annual Wellness (AWV)  08/24/2025   DEXA SCAN  Completed   Hepatitis C Screening  Completed   Meningococcal B Vaccine  Aged Out   FOOT EXAM  Discontinued        Assessment/Plan:  This is a routine wellness examination for Clydene.  Patient Care Team: Larraine Palma, MD as PCP - General (Family Medicine) Okey Vina GAILS, MD as Consulting Physician (Cardiology) [facility], [facility]  as Consulting Physician [facility])  I have personally reviewed and noted the following in the patient's chart:   Medical and social history Use of alcohol, tobacco or illicit drugs  Current medications and supplements including opioid prescriptions. Functional ability and status Nutritional status Physical activity Advanced directives List of other physicians Hospitalizations, surgeries, and ER visits in previous 12 months Vitals Screenings to include cognitive, depression, and falls Referrals and appointments  No orders of the defined types were placed in this encounter.  In addition, I have reviewed and discussed with patient certain preventive protocols, quality metrics, and best practice recommendations. A written personalized care plan for preventive services as well as general preventive health recommendations were provided to patient.   Roz LOISE Fuller, LPN   [id number]/86/[id number]   Return in about 1 year (around 08/24/2025) for Medicare wellness.  After Visit Summary: (MyChart) Due to this being a telephonic visit, the after visit summary with patients personalized plan was offered to patient via MyChart   Nurse Notes: Patient aware of current care gaps.  Patient declined vaccines.  Patient is up to date with eye exam (nurse will request records).

## 2024-09-01 ENCOUNTER — Encounter: Payer: Self-pay | Admitting: Pharmacist

## 2024-09-01 NOTE — Progress Notes (Signed)
 This patient is appearing on a report for being at risk of failing the adherence measure for diabetes medications this calendar year.   Medication: Mounjaro  (tirzepatide ) 5 mg Last fill date: 07/12/2024 for 90 day supply  Chart review completed

## 2024-09-04 ENCOUNTER — Encounter: Payer: Self-pay | Admitting: Family Medicine

## 2024-09-04 ENCOUNTER — Ambulatory Visit: Admitting: Family Medicine

## 2024-09-04 VITALS — BP 134/81 | HR 79 | Ht 65.5 in | Wt 194.0 lb

## 2024-09-04 DIAGNOSIS — I10 Essential (primary) hypertension: Secondary | ICD-10-CM | POA: Diagnosis not present

## 2024-09-04 DIAGNOSIS — K0889 Other specified disorders of teeth and supporting structures: Secondary | ICD-10-CM

## 2024-09-04 DIAGNOSIS — K047 Periapical abscess without sinus: Secondary | ICD-10-CM

## 2024-09-04 MED ORDER — AMOXICILLIN 875 MG PO TABS: 875.0000 mg | ORAL_TABLET | Freq: Two times a day (BID) | ORAL | 0 refills | Status: AC

## 2024-09-04 MED ORDER — CHLORHEXIDINE GLUCONATE 0.12 % MT SOLN: 15.0000 mL | Freq: Two times a day (BID) | OROMUCOSAL | 0 refills | Status: AC

## 2024-09-04 MED ORDER — AMLODIPINE BESYLATE 10 MG PO TABS: 10.0000 mg | ORAL_TABLET | Freq: Every day | ORAL | 3 refills | Status: AC

## 2024-09-04 MED ORDER — IBUPROFEN 600 MG PO TABS: 600.0000 mg | ORAL_TABLET | Freq: Three times a day (TID) | ORAL | 0 refills | Status: AC | PRN

## 2024-09-04 NOTE — Patient Instructions (Signed)
 It was great to see you today! Thank you for choosing [facility] for your primary care.  Today we addressed: Dental pain You may have an abscess developing or a gum infection.  I do not think we need to do imaging at this time, but if it gets worse we would get a CT scan. I am prescribing the antibiotic amoxicillin , which you should take twice daily for the next 10 days. I am also prescribing an antibacterial oral rinse called chlorhexidine , which you should use in the morning in the evening.  Please schedule with a dentist ASAP.  [facility] and [facility] are good options that were recommended to me by one of our physicians if you cannot find anywhere else on your own.  You should return to the clinic or go to the ED if your swelling, pain, redness are getting worse or if you develop new fevers.  Thank you for coming to see us  at [facility] and for the opportunity to care for you! Toma, Brynlee Pennywell, MD 09/04/2024, 2:21 PM

## 2024-09-04 NOTE — Assessment & Plan Note (Signed)
 Well controlled today. Continue amlodipine 10mg  daily.

## 2024-09-04 NOTE — Progress Notes (Signed)
   SUBJECTIVE:   CHIEF COMPLAINT / HPI:  Rachael Garcia is a 68 y.o. female with a pertinent past medical history of T2DM, asthma, and OSA presenting to the clinic for toothache.  Toothache Location: Left mandibular molar, previously broke tooth a few years ago Started: Intermittent pain since breaking tooth, worsening since Friday (3 days ago) Pain character: Fredericka Jaw/chewing pain: Yes, not chewing on that side Recent trauma: None, just remote tooth fracture Prior related issues: Recommended dental surgery 3 months ago at dentist, was told there is a lesion under the molar History of diabetes: Yes Treated with garlic and cloves, has taken ibuprofen  last night and this AM with good improvement. Has also been told she will need to get 3 other teeth pulled.  Other symptoms include: Fatigue Denies: Fevers, chills No history of cardiac valve replacement, heart surgery, or joint replacements.  Patients states she uses her CPAP and cleans it regularly. Not prescribed or using any inhalers containing ICS.   PERTINENT PMH / PSH: T2DM Asthma, OSA  *Remainder reviewed in problem list.   OBJECTIVE:   BP 134/81   Pulse 79   Ht 5' 5.5 (1.664 m)   Wt 194 lb (88 kg)   SpO2 98%   BMI 31.79 kg/m   General: Age-appropriate, resting comfortably in chair, NAD, alert and at baseline. HEENT:  Head: Normocephalic, atraumatic. No tenderness to percussion over sinuses. Eyes: PERRLA. No conjunctival erythema or scleral injections. Nose: Non-erythematous turbinates. No rhinorrhea. Mouth/Oral: Poor dentition.  One of left mandible molars with broken with visible root, mild surrounding edema and erythema but without discharge and nontender to touch, tolerated exam well. Neck: Supple. No LAD. Cardiovascular: Regular rate and rhythm. Normal S1/S2. No murmurs, rubs, or gallops appreciated. 2+ radial pulses. Pulmonary: Clear bilaterally to ascultation. No wheezes, crackles, or rhonchi. Normal WOB  on room air. No accessory muscle use. Extremities: No peripheral edema bilaterally. Capillary refill <2 seconds.   ASSESSMENT/PLAN:   Assessment & Plan Pain, dental Dental infection Poor dentition.  See evidence of gingivitis, possible early abscess.  Reassuringly, no systemic signs of infection.  Ultimate treatment would likely be extraction or root canal. - Chlorhexidine  rinse BID until seen by dentist - Amoxicillin  875 mg twice daily x 7 days - Limited course of ibuprofen  600 mg for pain, counseled on risks of prolonged use - Patient is going to schedule dentist appointment soon - Return if not improving, return precautions provided, would obtain CT scan Essential hypertension Well-controlled today. - Continue amlodipine  10 mg daily  No follow-ups on file.  Malaysia Crance Toma, MD [facility]

## 2024-09-22 ENCOUNTER — Encounter: Payer: Self-pay | Admitting: Pharmacist

## 2024-09-22 NOTE — Progress Notes (Signed)
 This patient is appearing on a report for being at risk of failing the adherence measure for diabetes medications this calendar year.   Medication: Mounjaro  Last fill date: 07/12/2024 for 84  day supply per [facility] for 2025  Reviewed medication indication, dosing, and goals of therapy.

## 2024-09-27 ENCOUNTER — Ambulatory Visit (HOSPITAL_BASED_OUTPATIENT_CLINIC_OR_DEPARTMENT_OTHER): Attending: Family Medicine | Admitting: Internal Medicine

## 2024-09-27 DIAGNOSIS — G4733 Obstructive sleep apnea (adult) (pediatric): Secondary | ICD-10-CM | POA: Diagnosis present

## 2024-09-27 DIAGNOSIS — R29818 Other symptoms and signs involving the nervous system: Secondary | ICD-10-CM

## 2024-10-01 DIAGNOSIS — G4733 Obstructive sleep apnea (adult) (pediatric): Secondary | ICD-10-CM | POA: Diagnosis not present

## 2024-10-01 NOTE — Procedures (Signed)
 "      [facility] [address] [place], [place] [address] Tel: [phone]   Fax: [phone]  Polysomnography Interpretation  Patient Name:  Rachael Garcia, Rachael Garcia Study Date:  09/27/2024 Referring Physician:  CARINA BROWN [phone]) %%startinterp%% Indications for Polysomnography The patient is a 68 year old Female who is 5' 5 and weighs 190.0 lbs. Her BMI equals 31.7.  A full night polysomnogram was performed to evaluate for -.OSA  Medication taken at 2111.  AMLODIPINE    Polysomnogram Data A full night polysomnogram recorded the standard physiologic parameters including EEG, EOG, EMG, EKG, nasal and oral airflow.  Respiratory parameters of chest and abdominal movements were recorded with Respiratory Inductance Plethysmography belts.  Oxygen saturation was recorded by pulse oximetry.   Sleep Architecture The total recording time of the polysomnogram was 429.1 minutes.  The total sleep time was 207.5 minutes.  The patient spent 8.0% of total sleep time in Stage N1, 62.2% in Stage N2, 25.8% in Stages N3, and 4.1% in REM.  Sleep latency was 73.5 minutes.  REM latency was 318.0 minutes.  Sleep Efficiency was 48.4%.  Wake after Sleep Onset time was 148.0 minutes.  Respiratory Events The polysomnogram revealed a presence of - obstructive, - central, and - mixed apneas resulting in an Apnea index of - events per hour.  There were 56 hypopneas (>=3% desaturation and/or arousal) resulting in an Apnea\Hypopnea Index (AHI >=3% desaturation and/or arousal) of 16.2 events per hour.  There were 29 hypopneas (>=4% desaturation) resulting in an Apnea\Hypopnea Index (AHI >=4% desaturation) of 8.4 events per hour.  There were 97 Respiratory Effort Related Arousals resulting in a RERA index of 28.0 events per hour. The Respiratory Disturbance Index is 44.2 events per hour.  The snore index was 202.1 events per hour.  Mean oxygen saturation was 91.3%.  The lowest oxygen  saturation during sleep was 82.0%.  Time spent <=88% oxygen saturation was 8.5 minutes (2.6%).  Limb Activity There were 53 total limb movements recorded, of this total, 53 were classified as PLMs.  PLM index was 15.3 per hour and PLM associated with Arousals index was - per hour.  Cardiac Summary The average pulse rate was 58.1 bpm.  The minimum pulse rate was 26.0 bpm while the maximum pulse rate was 85.0 bpm.  Cardiac rhythm was normal- with frequent PVCs  Comments: Mild obstructive sleep apnea, AHI (4%) 8.4/hr. Moderate snoring with oxygen desaturation nadir 82%, mean 91.3%. Insufficient early events to meet protocol requirement for split CPAP titration. See tech comments at end of report.  Diagnosis: Obstructive sleep apnea  Recommendations: Conservative options may include observation, weight loss and sleep position off back. Other options, including autopap (5-15), CPAP titration sleep study or fitted oral appliance, would be based on clinical judgment.   This study was personally reviewed and electronically signed by: NEYSA REGGY BIRCH, MD Accredited Board Certified in Sleep Medicine Date/Time: 10/01/24    1:01    %%endinterp%%   Diagnostic PSG Report  Patient Name: Rachael Garcia, Rachael Garcia Study Date: 09/27/2024  Date of Birth: 1955-10-16 Study Type: Diagnostic  Age: 68 year MRN #: [id number]  Sex: Female Interpreting Physician: NEYSA REGGY, [id number]  Height: 5' 5 Referring Physician: CARINA BROWN [phone])  Weight: 190.0 lbs Recording Tech: Charlie George RPSGT  BMI: 31.7 Scoring Tech: Charlie George RPSGT  ESS: 3/24 Neck Size: 15   Study Overview  Lights Off: 09:57:53 PM  Count Index  Lights On: 05:06:56 AM Awakenings: 23 6.7  Time in  Bed: 429.1 min. Arousals: 114 33.0  Total Sleep Time: 207.5 min. AHI (>=3% Desat and/or Ar.): 56 16.2   Sleep Efficiency: 48.4% AHI (>=4% Desat): 29 8.4   Sleep Latency: 73.5 min. Limb Movements: 53 15.3  Wake After Sleep Onset: 148.0 min.  Snore: 699 202.1  REM Latency from Sleep Onset: 318.0 min. Desaturations: 55 16.5     Minimum SpO2 TST: 82.0%    Sleep Architecture  % of Time in Bed Stages Time (mins) % Sleep Time  Wake 221.5   Stage N1 16.5 8.0%  Stage N2 129.0 62.2%  Stage N3 53.5 25.8%  REM 8.5 4.1%   Arousal Summary   NREM REM Sleep Index  Respiratory Arousals 112 - 112 32.4  PLM Arousals - - - -  Isolated Limb Movement Arousals - - - -  Snore Arousals 1 - 1 0.3  Spontaneous Arousals 1 - 1 0.3  Total 114 - 114 33.0   Limb Movement Summary   Count Index  Isolated Limb Movements - -  Periodic Limb Movements (PLMs) 53 15.3  Total Limb Movements 53 15.3    Respiratory Summary   By Sleep Stage By Body Position Total   NREM REM Supine Non-Supine   Time (min) 199.0 8.5 - 207.5 207.5         Obstructive Apnea - - - - -  Mixed Apnea - - - - -  Central Apnea - - - - -  Total Apneas - - - - -  Total Apnea Index - - - - -         Hypopneas (>=3% Desat and/or Ar.) 46 10 - 56 56  AHI (>=3% Desat and/or Ar.) 13.9 70.6 - 16.2 16.2         Hypopneas (>=4% Desat) 21 8 - 29 29  AHI (>=4% Desat) 6.3 56.5 - 8.4 8.4          RERAs 97 - - 97 97  RERA Index 29.2 - - 28.0 28.0         RDI 43.1 70.6 - 44.2 44.2    Respiratory Event Type Index  Central Apneas -  Obstructive Apneas -  Mixed Apneas -  Central Hypopneas -  Obstructive Hypopneas 16.2  Central Apnea + Hypopnea (CAHI) -  Obstructive Apnea + Hypopnea (OAHI) 16.2   Respiratory Event Durations   Apnea Hypopnea   NREM REM NREM REM  Average (seconds) - - 26.6 30.8  Maximum (seconds) - - 47.3 45.5    Oxygen Saturation Summary   Wake NREM REM TST TIB  Average SpO2 (%) 93.1% 90.8% 87.8% 90.6% 91.3%  Minimum SpO2 (%) 84.0% 87.0% 82.0% 82.0% 82.0%  Maximum SpO2 (%) 98.0% 96.0% 93.0% 96.0% 98.0%   Oxygen Saturation Distribution  Range (%) Time in range (min) Time in range (%)  90.0 - 100.0 135.3 41.5%  80.0 - 90.0 85.4 26.2%  70.0 - 80.0  - -  60.0 - 70.0 - -  50.0 - 60.0 - -  0.0 - 50.0 - -  Time Spent <=88% SpO2  Range (%) Time in range (min) Time in range (%)  0.0 - 88.0 8.5 2.6%      Count Index  Desaturations 55 16.5    Cardiac Summary   Wake NREM REM Sleep Total  Average Pulse Rate (BPM) 56.5 57.9 73.9 58.7 58.1  Minimum Pulse Rate (BPM) 26.0 30.0 64.0 30.0 26.0  Maximum Pulse Rate (BPM) 85.0 83.0 78.0 83.0 85.0   Pulse Rate  Distribution:  Range (bpm) Time in range (min) Time in range (%)  0.0 - 40.0 12.6 3.8%  40.0 - 60.0 83.1 25.3%  60.0 - 80.0 124.9 38.0%  80.0 - 100.0 0.6 0.2%  100.0 - 120.0 - -  120.0 - 140.0 - -  140.0 - 200.0 - -      Hypnograms                      Technologist Comments            The patient arrived for a split night study. The patient was placed in room 1. The procedure was explained, and questions were answered. The patient was fitted with a mask and trialed CPAP at a pressure of 5 cm H2O with an EPR of 2 prior to the start of the study. The patient seemed apprehensive about using CPAP and stated that she hoped she did not need it.            The patient did not have a high enough AHI or enough TST to meet split night criteria. A diagnostic PSG was performed. The patient was fitted with a small-wide ResMed AirFit F40 full-face mask.           Pulse oximeter readings were very sporadic at times. The patient stated that medical staff usually have a difficult time getting readings from her, but she was unable to specify which location worked best for readings. The left and right hands, right earlobe, and left foot were trialed. The best locations that had the fewest reading drops was the right side while the patient was in the right position. The second-best location for the pulse oximeter was on the foot.           The patient slept in the supine, left, and right positions. Oral venting and mild PLMs were observed. No bruxism, seizure, or spike wave activity  was observed. PVCs and trigeminy were present throughout the entire night. Snoring was moderate and audible. Stages N1, N2, N3, and REM were recorded. The patient had two-bathroom breaks.                         Reggy Salt Diplomate, Biomedical Engineer of Sleep Medicine  ELECTRONICALLY SIGNED ON:  10/01/2024, 12:53 PM [facility] PH: [phone]   FX: [phone] ACCREDITED BY THE AMERICAN ACADEMY OF SLEEP MEDICINE "

## 2024-10-01 NOTE — Procedures (Signed)
" °  Indications for Polysomnography The patient is a 68 year old Female who is 5' 5 and weighs 190.0 lbs. Her BMI equals 31.7.  A full night polysomnogram was performed to evaluate for -.  Medication taken at 2111.AMLODIPINE  Polysomnogram Data A full night polysomnogram recorded the standard physiologic parameters including EEG, EOG, EMG, EKG, nasal and oral airflow.  Respiratory parameters of chest and abdominal movements were recorded with Respiratory Inductance Plethysmography belts.   Oxygen saturation was recorded by pulse oximetry.  Sleep Architecture The total recording time of the polysomnogram was 429.1 minutes.  The total sleep time was 207.5 minutes.  The patient spent 8.0% of total sleep time in Stage N1, 62.2% in Stage N2, 25.8% in Stages N3, and 4.1% in REM.  Sleep latency was 73.5 minutes.   REM latency was 318.0 minutes.  Sleep Efficiency was 48.4%.  Wake after Sleep Onset time was 148.0 minutes.  Respiratory Events The polysomnogram revealed a presence of - obstructive, - central, and - mixed apneas resulting in an Apnea index of - events per hour.  There were 56 hypopneas (GreaterEqual to3% desaturation and/or arousal) resulting in an Apnea\Hypopnea Index (AHI  GreaterEqual to3% desaturation and/or arousal) of 16.2 events per hour.  There were 29 hypopneas (GreaterEqual to4% desaturation) resulting in an Apnea\Hypopnea Index (AHI GreaterEqual to4% desaturation) of 8.4 events per hour.  There were 97 Respiratory  Effort Related Arousals resulting in a RERA index of 28.0 events per hour. The Respiratory Disturbance Index is 44.2 events per hour.  The snore index was 202.1 events per hour.  Mean oxygen saturation was 91.3%.  The lowest oxygen saturation during sleep was 82.0%.  Time spent LessEqual to88% oxygen saturation was  minutes ().  Limb Activity There were 53 total limb movements recorded, of this total, 53 were classified as PLMs.  PLM index was 15.3 per hour and PLM  associated with Arousals index was - per hour.  Cardiac Summary The average pulse rate was 58.1 bpm.  The minimum pulse rate was 26.0 bpm while the maximum pulse rate was 85.0 bpm.  Cardiac rhythm was normal/abnormal.  Comments:  Diagnosis:  Recommendations:   This study was personally reviewed and electronically signed by: NEYSA REGGY BIRCH, MD Accredited Board Certified in Sleep Medicine Date/Time: "

## 2024-10-04 ENCOUNTER — Other Ambulatory Visit: Payer: Self-pay

## 2024-10-04 MED ORDER — MOUNJARO 5 MG/0.5ML ~~LOC~~ SOAJ: 5.0000 mg | SUBCUTANEOUS | 3 refills | Status: AC

## 2024-10-09 ENCOUNTER — Telehealth: Payer: Self-pay

## 2024-10-09 ENCOUNTER — Other Ambulatory Visit (HOSPITAL_COMMUNITY): Payer: Self-pay

## 2024-10-09 NOTE — Telephone Encounter (Signed)
 Pharmacy Patient Advocate Encounter  Received notification from [employer] that Prior Authorization for MOUNJARO  5MG  has been APPROVED from 10/09/24 to 10/09/25   PA #/Case ID/Reference #: [id number]

## 2024-10-09 NOTE — Telephone Encounter (Signed)
 Pharmacy Patient Advocate Encounter   Received notification from CoverMyMeds that prior authorization for MOUNJARO  5MG  is required/requested.   Insurance verification completed.   The patient is insured through [facility] ADVANTAGE/RX ADVANCE.   PA required; PA submitted to above mentioned insurance via Latent Key/confirmation #/EOC [id number]. Status is pending.

## 2024-10-09 NOTE — Telephone Encounter (Signed)
 Patient calls nurse line regarding her Mounjaro  prescription.   Advised that rx was sent to pharmacy on 12/24. Called pharmacy. They advised that PA was needed.   Will forward to [place] for further assistance.   Chiquita JAYSON English, RN

## 2024-10-11 NOTE — Telephone Encounter (Signed)
 Called pharmacy with approval.   Called patient with update. She said that she was able to pick up medication yesterday.   Nothing further needed at this time.   Chiquita JAYSON English, RN

## 2024-10-30 ENCOUNTER — Encounter: Payer: Self-pay | Admitting: Dermatology

## 2024-10-30 ENCOUNTER — Ambulatory Visit: Admitting: Dermatology

## 2024-10-30 VITALS — BP 146/90 | HR 53

## 2024-10-30 DIAGNOSIS — L281 Prurigo nodularis: Secondary | ICD-10-CM | POA: Diagnosis not present

## 2024-10-30 DIAGNOSIS — L299 Pruritus, unspecified: Secondary | ICD-10-CM

## 2024-10-30 MED ORDER — HYDROXYZINE HCL 25 MG PO TABS: 25.0000 mg | ORAL_TABLET | Freq: Every evening | ORAL | 4 refills | Status: AC

## 2024-10-30 MED ORDER — TACROLIMUS 0.1 % EX OINT: TOPICAL_OINTMENT | Freq: Two times a day (BID) | CUTANEOUS | 9 refills | Status: AC

## 2024-10-30 MED ORDER — CLOBETASOL PROPIONATE 0.05 % EX OINT: 1.0000 | TOPICAL_OINTMENT | Freq: Two times a day (BID) | CUTANEOUS | 9 refills | Status: AC

## 2024-10-30 NOTE — Progress Notes (Signed)
 "   New Patient Visit   Subjective  Rachael Garcia is a 69 y.o. 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 Patient reports the areas have been there for several years. She reports the areas are bothersome.Patient reports the areas can be itchy. Patient rates irritation 10 out of 10. She states that the areas have spread. Patient reports she has previously been treated for these areas. Patient reports Hx of bx. Patient denies family history of skin cancer(s).  Patient has previously tried and failed the following medications: -   Patient provided verbal consent for the use of an AI-assisted program to generate a detailed after-visit summary. The patient understands that the AI tool is used to support clinical documentation and that all information will be reviewed and verified by the healthcare provider.  The following portions of the chart were reviewed this encounter and updated as appropriate: medications, allergies, medical history  Review of Systems:  No other skin or systemic complaints except as noted in HPI or Assessment and Plan.  Objective  Well appearing patient in no apparent distress; mood and affect are within normal limits.  A focused examination was performed of the following areas: Lower Legs  Relevant exam findings are noted in the Assessment and Plan.          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 Previous treatments with clobetasol  and tacrolimus  were insufficient due to inadequate duration. Anxiety and sugar intake may exacerbate symptoms. Phototherapy discussed but not pursued due to logistical challenges. Nemluvio considered if current regimen fails.  - Apply clobetasol  ointment, one fingertip unit per leg, every morning and night for two weeks. - Switch to tacrolimus   ointment, one fingertip unit per leg, every morning and night for two weeks. - Alternate clobetasol  and tacrolimus  every two weeks for a total of 12 weeks. - Consider Nemluvio if no improvement in 12 weeks. - Use CeraVe anti-itch moisturizer before or mixed with prescription creams. - Take Epsom salt or Aveeno oatmeal baths once a week. - Use cold compresses or ice packs at night to alleviate pruritus. - Consider hydroxyzine  as needed for pruritus, especially at night. - Engage in anxiety management techniques such as meditation, physical activity, and therapy. PRURITUS   This Visit - clobetasol  ointment (TEMOVATE ) 0.05 % - Apply 1 Application topically 2 (two) times daily. Apply for 2 weeks then STOP and switch to Tacrolimus . Take a break for 2 weeks - tacrolimus  (PROTOPIC ) 0.1 % ointment - Apply topically 2 (two) times daily. Apply for 2 weeks then STOP for 2 weeks and switch to Clobetasol . - hydrOXYzine  (ATARAX ) 25 MG tablet - Take 1 tablet (25 mg total) by mouth at bedtime. Start with 1/2 tablet if tolerated well increase to 1 full tablet nightly PRURIGO NODULARIS   This Visit - clobetasol  ointment (TEMOVATE ) 0.05 % - Apply 1 Application topically 2 (two) times daily. Apply for 2 weeks then STOP and switch to Tacrolimus . Take a break for 2 weeks - tacrolimus  (PROTOPIC ) 0.1 % ointment - Apply topically 2 (two) times daily. Apply for 2 weeks then STOP for 2 weeks and switch to Clobetasol .  Return in about 3 months (around 01/28/2025) for Lichen Planus F/U (Ok'd to DB at 0:15 NOT 11:15 per JD).  I, Jetta Ager, am acting as neurosurgeon for [employer], DO.  Documentation: I have reviewed the above documentation for accuracy and completeness, and I agree with the above.  Delon Lenis, DO     "

## 2024-10-30 NOTE — Patient Instructions (Addendum)
 VISIT SUMMARY:  During your visit, we discussed your persistent itching and skin lesions, which have been diagnosed as chronic Lichen Planus. We reviewed your previous treatments and explored new options to help manage your symptoms.  YOUR PLAN:  -Lichen Planus:  This a skin condition characterized by itchy, raised bumps secondary to underlying inflammation in the skin.  This is usually triggered by an infection that up regulates the immune system in the skin.   We will start a new treatment regimen alternating clobetasol  and tacrolimus  ointments every two weeks for a total of 12 weeks. If there is no improvement, we may consider Nemluvio.   Additionally, you should use CeraVe anti-itch moisturizer, take Epsom salt or Aveeno oatmeal baths once a week, and use cold compresses or ice packs at night.   Hydroxyzine  can be taken as needed for itching, especially at night.   Managing anxiety through meditation, physical activity, and therapy is also recommended.  FOLLOW UP: 3 months   Important Information   Due to recent changes in healthcare laws, you may see results of your pathology and/or laboratory studies on MyChart before the doctors have had a chance to review them. We understand that in some cases there may be results that are confusing or concerning to you. Please understand that not all results are received at the same time and often the doctors may need to interpret multiple results in order to provide you with the best plan of care or course of treatment. Therefore, we ask that you please give us  2 business days to thoroughly review all your results before contacting the office for clarification. Should we see a critical lab result, you will be contacted sooner.     If You Need Anything After Your Visit   If you have any questions or concerns for your doctor, please call our main line at [phone]. If no one answers, please leave a voicemail as directed and we will return your  call as soon as possible. Messages left after 4 pm will be answered the following business day.    You may also send us  a message via MyChart. We typically respond to MyChart messages within 1-2 business days.  For prescription refills, please ask your pharmacy to contact our office. Our fax number is [phone].  If you have an urgent issue when the clinic is closed that cannot wait until the next business day, you can page your doctor at the number below.     Please note that while we do our best to be available for urgent issues outside of office hours, we are not available 24/7.    If you have an urgent issue and are unable to reach us , you may choose to seek medical care at your doctor's office, retail clinic, urgent care center, or emergency room.   If you have a medical emergency, please immediately call 911 or go to the emergency department. In the event of inclement weather, please call our main line at [phone] for an update on the status of any delays or closures.  Dermatology Medication Tips: Please keep the boxes that topical medications come in in order to help keep track of the instructions about where and how to use these. Pharmacies typically print the medication instructions only on the boxes and not directly on the medication tubes.   If your medication is too expensive, please contact our office at [phone] or send us  a message through MyChart.    We are unable to tell  what your co-pay for medications will be in advance as this is different depending on your insurance coverage. However, we may be able to find a substitute medication at lower cost or fill out paperwork to get insurance to cover a needed medication.    If a prior authorization is required to get your medication covered by your insurance company, please allow us  1-2 business days to complete this process.   Drug prices often vary depending on where the prescription is filled and some pharmacies may  offer cheaper prices.   The website www.goodrx.com contains coupons for medications through different pharmacies. The prices here do not account for what the cost may be with help from insurance (it may be cheaper with your insurance), but the website can give you the price if you did not use any insurance.  - You can print the associated coupon and take it with your prescription to the pharmacy.  - You may also stop by our office during regular business hours and pick up a GoodRx coupon card.  - If you need your prescription sent electronically to a different pharmacy, notify our office through [facility] or by phone at [phone]

## 2024-11-01 ENCOUNTER — Encounter: Payer: Self-pay | Admitting: Pharmacist

## 2024-11-01 NOTE — Progress Notes (Signed)
 This patient is appearing on a report for being at risk of failing the adherence measure for diabetes medications this calendar year.   Medication: Mounjaro  (tirzepatide ) 5mg / 0.5 mL Pen Last fill date: 10/10/24 for 84 day supply  Reviewed medication indication, dosing, and goals of therapy.

## 2024-11-16 NOTE — Progress Notes (Unsigned)
" ° ° °  SUBJECTIVE:   CHIEF COMPLAINT / HPI:   Sinus infection  Diabetes Current Regimen: Mounjaro  5 mg weekly CBGs: CGM Last [name]:  Lab Results  Component Value Date   HGBA1C 6.0 04/21/2024    Denies polyuria, polydipsia, hypoglycemia. Last Eye Exam: UTD Statin: Atorvastatin  40 mg daily ACE/ARB: Losartan  25 mg daily  PERTINENT  PMH / PSH: ***  OBJECTIVE:   There were no vitals taken for this visit. ***  General: NAD, pleasant, able to participate in exam Cardiac: RRR, no murmurs. Respiratory: CTAB, normal effort, No wheezes, rales or rhonchi Abdomen: Bowel sounds present, nontender, nondistended Extremities: no edema or cyanosis. Skin: warm and dry, no rashes noted Neuro: alert, no obvious focal deficits Psych: Normal affect and mood  ASSESSMENT/PLAN:   No problem-specific Assessment & Plan notes found for this encounter.     Dr. Izetta Nap, DO [facility]    {    This will disappear when note is signed, click to select method of visit    :1} "

## 2024-11-17 ENCOUNTER — Encounter: Payer: Self-pay | Admitting: Family Medicine

## 2024-11-17 ENCOUNTER — Ambulatory Visit: Payer: Self-pay | Admitting: Family Medicine

## 2024-11-17 VITALS — BP 142/96 | HR 55 | Ht 65.0 in | Wt 194.6 lb

## 2024-11-17 DIAGNOSIS — E119 Type 2 diabetes mellitus without complications: Secondary | ICD-10-CM

## 2024-11-17 DIAGNOSIS — J019 Acute sinusitis, unspecified: Secondary | ICD-10-CM

## 2024-11-17 DIAGNOSIS — M25551 Pain in right hip: Secondary | ICD-10-CM

## 2024-11-17 DIAGNOSIS — I10 Essential (primary) hypertension: Secondary | ICD-10-CM

## 2024-11-17 DIAGNOSIS — G4733 Obstructive sleep apnea (adult) (pediatric): Secondary | ICD-10-CM

## 2024-11-17 LAB — POCT GLYCOSYLATED HEMOGLOBIN (HGB A1C): HbA1c, POC (controlled diabetic range): 6 % (ref 0.0–7.0)

## 2024-11-17 MED ORDER — AZELASTINE HCL 0.1 % NA SOLN: 2.0000 | Freq: Two times a day (BID) | NASAL | 0 refills | Status: AC

## 2024-11-17 NOTE — Assessment & Plan Note (Signed)
 142/96 upon repeat, close to goal and recommended continuing home monitoring as able.  If persistent concerns recommend follow-up in 2 weeks for nurse visit BP check

## 2024-11-17 NOTE — Assessment & Plan Note (Signed)
 [name] 6.0, doing well Mounjaro  5 mg weekly. -Repeat [name] in 6 months -ACR

## 2024-11-17 NOTE — Patient Instructions (Addendum)
 It was wonderful to see you today! Thank you for choosing [facility].   Please bring ALL of your medications with you to every visit.   Today we talked about:  For your nasal irritation I do think using nasal saline, cool humidified air and Vaseline on the base of the naris can help with some moisture.  After you do those things utilizing your Flonase , 2 sprays in each nostril and utilizing that azelastine  2 sprays in each nostril up to twice per day to help.  You can also use Mucinex twice per day to help thin the mucus just stay well-hydrated.  If you develop a secondary fever with a lot of green mucus drainage that last longer than 2 weeks concerning for a secondary bacterial infection. For your hip I do recommend doing some hip strengthening and stretching exercises like I have attached every night if possible.  Movement is good for arthritis and if your pain gets severe that is limiting your mobility or interfering with your ability to work please come back and see us .  You can also use topical lidocaine  patches over the area that you can find at the pharmacy as well. You do have mild sleep apnea, options for this are considering CPAP which I know is not a preferred option.  You could also try to reach out to any sleep dentist for an oral mouthpiece that can help with your symptoms potentially, there is mixed efficacy. Your [name] is 6.0, keep up the good work and the [facility]  is doing well for you! [facility] [address] [place]. [phone] Your blood pressure is elevated today, I would like you to check it at home if possible or at other sites adjust her shoes further.  If symptoms get I would recommend coming back in 2 weeks for nurse visit to recheck your blood pressure to see if we need to adjust your medication further.  Please follow up as needed for persistent symptoms or for blood pressure check in 2 weeks if needed  If you haven't already, sign up for My  Chart to have easy access to your labs results, and communication with your primary care physician.   We are checking some labs today. If they are abnormal, I will call you. If they are normal, I will send you a MyChart message (if it is active) or a letter in the mail. If you do not hear about your labs in the next 2 weeks, please call the office.  Call the clinic at [phone] if your symptoms worsen or you have any concerns.  Please be sure to schedule follow up at the front desk before you leave today.   Izetta Nap, DO Family Medicine

## 2025-01-30 ENCOUNTER — Ambulatory Visit: Admitting: Dermatology
